# Patient Record
Sex: Female | Born: 1935 | Hispanic: No | Smoking: Never smoker
Health system: Southern US, Community
[De-identification: ages and names within clinical notes are randomized; demographics above are authoritative.]

## PROBLEM LIST (undated history)

## (undated) DIAGNOSIS — G309 Alzheimer's disease, unspecified: Secondary | ICD-10-CM

## (undated) DIAGNOSIS — E119 Type 2 diabetes mellitus without complications: Secondary | ICD-10-CM

## (undated) DIAGNOSIS — E079 Disorder of thyroid, unspecified: Secondary | ICD-10-CM

## (undated) DIAGNOSIS — K5730 Diverticulosis of large intestine without perforation or abscess without bleeding: Secondary | ICD-10-CM

## (undated) DIAGNOSIS — F32A Depression, unspecified: Secondary | ICD-10-CM

## (undated) DIAGNOSIS — K222 Esophageal obstruction: Secondary | ICD-10-CM

## (undated) DIAGNOSIS — E785 Hyperlipidemia, unspecified: Secondary | ICD-10-CM

## (undated) DIAGNOSIS — D126 Benign neoplasm of colon, unspecified: Secondary | ICD-10-CM

## (undated) DIAGNOSIS — F419 Anxiety disorder, unspecified: Secondary | ICD-10-CM

## (undated) DIAGNOSIS — K449 Diaphragmatic hernia without obstruction or gangrene: Secondary | ICD-10-CM

## (undated) DIAGNOSIS — E559 Vitamin D deficiency, unspecified: Secondary | ICD-10-CM

## (undated) DIAGNOSIS — F0280 Dementia in other diseases classified elsewhere without behavioral disturbance: Secondary | ICD-10-CM

## (undated) DIAGNOSIS — F329 Major depressive disorder, single episode, unspecified: Secondary | ICD-10-CM

## (undated) DIAGNOSIS — F0390 Unspecified dementia without behavioral disturbance: Secondary | ICD-10-CM

## (undated) DIAGNOSIS — R5381 Other malaise: Secondary | ICD-10-CM

## (undated) DIAGNOSIS — I10 Essential (primary) hypertension: Secondary | ICD-10-CM

## (undated) DIAGNOSIS — R42 Dizziness and giddiness: Secondary | ICD-10-CM

## (undated) DIAGNOSIS — R269 Unspecified abnormalities of gait and mobility: Principal | ICD-10-CM

## (undated) DIAGNOSIS — R1110 Vomiting, unspecified: Secondary | ICD-10-CM

## (undated) DIAGNOSIS — D638 Anemia in other chronic diseases classified elsewhere: Secondary | ICD-10-CM

## (undated) DIAGNOSIS — H9190 Unspecified hearing loss, unspecified ear: Secondary | ICD-10-CM

## (undated) DIAGNOSIS — M545 Low back pain: Secondary | ICD-10-CM

## (undated) DIAGNOSIS — R5383 Other fatigue: Secondary | ICD-10-CM

## (undated) DIAGNOSIS — I739 Peripheral vascular disease, unspecified: Secondary | ICD-10-CM

## (undated) DIAGNOSIS — M62838 Other muscle spasm: Secondary | ICD-10-CM

## (undated) DIAGNOSIS — K210 Gastro-esophageal reflux disease with esophagitis: Secondary | ICD-10-CM

## (undated) HISTORY — DX: Essential (primary) hypertension: I10

## (undated) HISTORY — DX: Other muscle spasm: M62.838

## (undated) HISTORY — DX: Vitamin D deficiency, unspecified: E55.9

## (undated) HISTORY — DX: Vomiting, unspecified: R11.10

## (undated) HISTORY — DX: Type 2 diabetes mellitus without complications: E11.9

## (undated) HISTORY — DX: Alzheimer's disease, unspecified: G30.9

## (undated) HISTORY — DX: Low back pain: M54.5

## (undated) HISTORY — DX: Hyperlipidemia, unspecified: E78.5

## (undated) HISTORY — DX: Disorder of thyroid, unspecified: E07.9

## (undated) HISTORY — DX: Gastro-esophageal reflux disease with esophagitis: K21.0

## (undated) HISTORY — DX: Unspecified dementia without behavioral disturbance: F03.90

## (undated) HISTORY — DX: Peripheral vascular disease, unspecified: I73.9

## (undated) HISTORY — DX: Dementia in other diseases classified elsewhere without behavioral disturbance: F02.80

## (undated) HISTORY — DX: Diverticulosis of large intestine without perforation or abscess without bleeding: K57.30

## (undated) HISTORY — PX: RETINAL DETACHMENT SURGERY: SHX105

## (undated) HISTORY — DX: Anxiety disorder, unspecified: F41.9

## (undated) HISTORY — DX: Dizziness and giddiness: R42

## (undated) HISTORY — DX: Diaphragmatic hernia without obstruction or gangrene: K44.9

## (undated) HISTORY — PX: BREAST CYST ASPIRATION: SHX578

## (undated) HISTORY — DX: Depression, unspecified: F32.A

## (undated) HISTORY — DX: Other fatigue: R53.83

## (undated) HISTORY — DX: Benign neoplasm of colon, unspecified: D12.6

## (undated) HISTORY — DX: Anemia in other chronic diseases classified elsewhere: D63.8

## (undated) HISTORY — DX: Other malaise: R53.81

## (undated) HISTORY — DX: Unspecified abnormalities of gait and mobility: R26.9

## (undated) HISTORY — DX: Esophageal obstruction: K22.2

## (undated) HISTORY — DX: Unspecified hearing loss, unspecified ear: H91.90

## (undated) HISTORY — DX: Major depressive disorder, single episode, unspecified: F32.9

---
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
## 1983-12-16 HISTORY — PX: CATARACT EXTRACTION W/ INTRAOCULAR LENS  IMPLANT, BILATERAL: SHX1307

## 2002-02-14 ENCOUNTER — Other Ambulatory Visit: Admission: RE | Admit: 2002-02-14 | Discharge: 2002-02-14 | Payer: Self-pay | Admitting: Geriatric Medicine

## 2009-05-23 ENCOUNTER — Encounter: Admission: RE | Admit: 2009-05-23 | Discharge: 2009-05-23 | Payer: Self-pay | Admitting: Geriatric Medicine

## 2009-06-04 ENCOUNTER — Ambulatory Visit (HOSPITAL_COMMUNITY): Admission: RE | Admit: 2009-06-04 | Discharge: 2009-06-04 | Payer: Self-pay | Admitting: Gastroenterology

## 2009-12-15 DIAGNOSIS — M545 Low back pain, unspecified: Secondary | ICD-10-CM

## 2009-12-15 DIAGNOSIS — R269 Unspecified abnormalities of gait and mobility: Secondary | ICD-10-CM

## 2009-12-15 DIAGNOSIS — R42 Dizziness and giddiness: Secondary | ICD-10-CM

## 2009-12-15 DIAGNOSIS — F0280 Dementia in other diseases classified elsewhere without behavioral disturbance: Secondary | ICD-10-CM

## 2009-12-15 DIAGNOSIS — E119 Type 2 diabetes mellitus without complications: Secondary | ICD-10-CM

## 2009-12-15 DIAGNOSIS — K222 Esophageal obstruction: Secondary | ICD-10-CM

## 2009-12-15 DIAGNOSIS — D126 Benign neoplasm of colon, unspecified: Secondary | ICD-10-CM

## 2009-12-15 DIAGNOSIS — K449 Diaphragmatic hernia without obstruction or gangrene: Secondary | ICD-10-CM

## 2009-12-15 DIAGNOSIS — R5381 Other malaise: Secondary | ICD-10-CM

## 2009-12-15 DIAGNOSIS — I739 Peripheral vascular disease, unspecified: Secondary | ICD-10-CM

## 2009-12-15 DIAGNOSIS — R1110 Vomiting, unspecified: Secondary | ICD-10-CM

## 2009-12-15 DIAGNOSIS — K5730 Diverticulosis of large intestine without perforation or abscess without bleeding: Secondary | ICD-10-CM

## 2009-12-15 DIAGNOSIS — E559 Vitamin D deficiency, unspecified: Secondary | ICD-10-CM

## 2009-12-15 DIAGNOSIS — H9190 Unspecified hearing loss, unspecified ear: Secondary | ICD-10-CM

## 2009-12-15 DIAGNOSIS — K2100 Gastro-esophageal reflux disease with esophagitis, without bleeding: Secondary | ICD-10-CM

## 2009-12-15 DIAGNOSIS — F0390 Unspecified dementia without behavioral disturbance: Secondary | ICD-10-CM

## 2009-12-15 HISTORY — PX: ESOPHAGEAL DILATION: SHX303

## 2009-12-15 HISTORY — DX: Vomiting, unspecified: R11.10

## 2009-12-15 HISTORY — DX: Other malaise: R53.81

## 2009-12-15 HISTORY — DX: Dizziness and giddiness: R42

## 2009-12-15 HISTORY — DX: Benign neoplasm of colon, unspecified: D12.6

## 2009-12-15 HISTORY — DX: Unspecified abnormalities of gait and mobility: R26.9

## 2009-12-15 HISTORY — DX: Diverticulosis of large intestine without perforation or abscess without bleeding: K57.30

## 2009-12-15 HISTORY — DX: Esophageal obstruction: K22.2

## 2009-12-15 HISTORY — DX: Unspecified dementia, unspecified severity, without behavioral disturbance, psychotic disturbance, mood disturbance, and anxiety: F03.90

## 2009-12-15 HISTORY — DX: Gastro-esophageal reflux disease with esophagitis, without bleeding: K21.00

## 2009-12-15 HISTORY — DX: Low back pain, unspecified: M54.50

## 2009-12-15 HISTORY — DX: Vitamin D deficiency, unspecified: E55.9

## 2009-12-15 HISTORY — DX: Peripheral vascular disease, unspecified: I73.9

## 2009-12-15 HISTORY — DX: Diaphragmatic hernia without obstruction or gangrene: K44.9

## 2009-12-15 HISTORY — DX: Dementia in other diseases classified elsewhere, unspecified severity, without behavioral disturbance, psychotic disturbance, mood disturbance, and anxiety: F02.80

## 2009-12-15 HISTORY — DX: Unspecified hearing loss, unspecified ear: H91.90

## 2009-12-15 HISTORY — DX: Type 2 diabetes mellitus without complications: E11.9

## 2010-03-20 ENCOUNTER — Ambulatory Visit (HOSPITAL_COMMUNITY): Admission: RE | Admit: 2010-03-20 | Discharge: 2010-03-20 | Payer: Self-pay | Admitting: Gastroenterology

## 2010-04-17 ENCOUNTER — Ambulatory Visit (HOSPITAL_COMMUNITY): Admission: RE | Admit: 2010-04-17 | Discharge: 2010-04-17 | Payer: Self-pay | Admitting: Gastroenterology

## 2010-06-05 ENCOUNTER — Encounter: Admission: RE | Admit: 2010-06-05 | Discharge: 2010-06-05 | Payer: Self-pay | Admitting: Geriatric Medicine

## 2010-12-15 DIAGNOSIS — D638 Anemia in other chronic diseases classified elsewhere: Secondary | ICD-10-CM

## 2010-12-15 HISTORY — DX: Anemia in other chronic diseases classified elsewhere: D63.8

## 2011-04-29 NOTE — Op Note (Signed)
NAMESEVYN, [name]                  ACCOUNT NO.:  [id number]   MEDICAL RECORD NO.:  [id number]          PATIENT TYPE:  AMB   LOCATION:  ENDO                         FACILITY:  [facility]   PHYSICIAN:  Danise Edge, M.D.   DATE OF BIRTH:  [birth year]   DATE OF PROCEDURE:  06/04/2009  DATE OF DISCHARGE:  06/04/2009                               OPERATIVE REPORT   An esophagogastroduodenoscopy and Savary esophageal dilation report   REFERRING PHYSICIAN:  Hal T. Stoneking, MD   PROBLEM:  High-grade distal esophageal obstruction.   HISTORY:  Ms. [name] is a 77 year old female born on 30-Sep-1913.  The  patient has both liquid and solid food dysphagia.  She denies heartburn  but does experience regurgitation of mucus and saliva.   The patient's barium swallow x-ray showed a dilated proximal esophagus  with diminished primary peristalsis and distal esophageal narrowing  suspicious for a stricture above a large hiatal hernia.  The 13-mm  barium tablet would not pass the distal esophageal narrowing.   MEDICATION ALLERGIES:  None.   PAST MEDICAL AND SURGICAL HISTORY:  Hypertension diagnosed in 1990,  adenomatous colon polyp removed colonoscopically.  Hypercholesterolemia,  hyperglycinemia.   SOCIAL HISTORY:  The patient does not smoke cigarettes or consume  alcohol.   CHRONIC MEDICATIONS:  1. Norvasc.  2. Micardis.  3. Maxzide.  4. Calcium with vitamin D.  5. Aspirin 81 mg .  6. Multivitamin.   ENDOSCOPIST:  Danise Edge, MD   PREMEDICATION:  1. Fentanyl 35 mcg.  2. Versed 5 mg.   PROCEDURE:  After obtaining informed consent, the patient was placed in  the left lateral decubitus position on the fluoroscopy table.  I  administered intravenous fentanyl and intravenous Versed to achieve  conscious sedation for the procedure.  The patient's blood pressure,  oxygen saturation, and cardiac rhythm were monitored throughout the  procedure and documented in the medical record.   The Pentax gastroscope was passed through the posterior hypopharynx into  the proximal esophagus without difficulty.  The hypopharynx, larynx, and  vocal cords appeared normal.   ESOPHAGOSCOPY:  The proximal and mid segments of the esophagus appeared  normal.  There is a benign-appearing peptic stricture at the  esophagogastric junction which is noted at approximately 34 cm from the  incisor teeth.  There is no endoscopic evidence for the presence of  esophageal tumors, erosive esophagitis, or Barrett esophagus.  I could  not traverse the benign distal esophageal stricture with the  gastroscope.  The Savary dilator wire was passed through the gastroscope  and under fluoroscopic guidance advanced to the distal gastric antrum.  Under fluoroscopic guidance, the 10-mm, 11-mm, and 12-mm Savary dilators  were passed without resistance.  The Savary dilator wire was removed.  The Pentax gastroscope was again passed through the posterior  hypopharynx into the proximal esophagus without difficulty.  I was  easily able to traverse the benign distal esophageal stricture and  entered the stomach with the gastroscope.   GASTROSCOPY:  Retroflex view of the gastric cardia and fundus reveals a  large hiatal hernia.  The gastric cardia and fundus otherwise appeared  normal.  The gastric body, antrum, and pylorus appeared normal.   DUODENOSCOPY:  The duodenal bulb and descending duodenum appeared  normal.   ASSESSMENT:  Benign-appearing distal esophageal stricture at 34 cm from  the incisor teeth associated with a large hiatal hernia.  Savary  esophageal dilation performed with the 10-mm, 11-mm, and 12-mm Savary  dilators.           ______________________________  Danise Edge, M.D.     MJ/MEDQ  D:  06/04/2009  T:  06/05/2009  Job:  [id number]   cc:   Hal T. Stoneking, M.D.

## 2011-11-15 DIAGNOSIS — M62838 Other muscle spasm: Secondary | ICD-10-CM

## 2011-11-15 HISTORY — DX: Other muscle spasm: M62.838

## 2013-04-05 LAB — CBC AND DIFFERENTIAL
Hemoglobin: 13.4 g/dL (ref 12.0–16.0)
WBC: 14.5 10^3/mL

## 2013-04-05 LAB — BASIC METABOLIC PANEL
BUN: 20 mg/dL (ref 4–21)
Creatinine: 1.2 mg/dL — AB (ref 0.5–1.1)
Potassium: 4.2 mmol/L (ref 3.4–5.3)
Sodium: 129 mmol/L — AB (ref 137–147)

## 2013-04-05 LAB — HEPATIC FUNCTION PANEL
AST: 20 U/L (ref 13–35)
Bilirubin, Total: 0.4 mg/dL

## 2013-04-06 ENCOUNTER — Encounter: Payer: Self-pay | Admitting: Family Medicine

## 2013-04-06 ENCOUNTER — Ambulatory Visit (INDEPENDENT_AMBULATORY_CARE_PROVIDER_SITE_OTHER): Payer: Medicare Other | Admitting: Family Medicine

## 2013-04-06 VITALS — BP 110/80 | HR 72 | Temp 98.4°F | Resp 16

## 2013-04-06 DIAGNOSIS — R42 Dizziness and giddiness: Secondary | ICD-10-CM

## 2013-04-06 DIAGNOSIS — F0391 Unspecified dementia with behavioral disturbance: Secondary | ICD-10-CM

## 2013-04-06 DIAGNOSIS — N390 Urinary tract infection, site not specified: Secondary | ICD-10-CM

## 2013-04-06 DIAGNOSIS — F03918 Unspecified dementia, unspecified severity, with other behavioral disturbance: Secondary | ICD-10-CM

## 2013-04-06 DIAGNOSIS — M549 Dorsalgia, unspecified: Secondary | ICD-10-CM

## 2013-04-06 LAB — POCT URINALYSIS DIPSTICK
Bilirubin, UA: NEGATIVE
Urobilinogen, UA: 0.2
pH, UA: 7

## 2013-04-06 LAB — POCT CBC
Granulocyte percent: 64.6 %G (ref 37–80)
HCT, POC: 40.1 % (ref 37.7–47.9)
Hemoglobin: 13 g/dL (ref 12.2–16.2)
MCH, POC: 29.5 pg (ref 27–31.2)
MCHC: 32.4 g/dL (ref 31.8–35.4)
POC LYMPH PERCENT: 27.6 %L (ref 10–50)
Platelet Count, POC: 451 10*3/uL — AB (ref 142–424)

## 2013-04-06 LAB — POCT UA - MICROSCOPIC ONLY: Mucus, UA: NEGATIVE

## 2013-04-06 LAB — BASIC METABOLIC PANEL
Calcium: 9.9 mg/dL (ref 8.4–10.5)
Creat: 1.03 mg/dL (ref 0.50–1.10)
Glucose, Bld: 110 mg/dL — ABNORMAL HIGH (ref 70–99)
Sodium: 123 mEq/L — ABNORMAL LOW (ref 135–145)

## 2013-04-06 MED ORDER — CEFTRIAXONE SODIUM 1 G IJ SOLR
1.0000 g | Freq: Once | INTRAMUSCULAR | Status: AC
  Administered 2013-04-06: 1 g via INTRAMUSCULAR

## 2013-04-06 NOTE — Progress Notes (Signed)
Subjective:    Patient ID: [name], female    DOB: May 01, 1913, 79 y.o.   MRN: [id number] Chief Complaint  Patient presents with  . Back Pain    has had U/A Labs and xrays already   . Dementia    worse past few days  . Dizziness    feels unsteady    HPI  [name] is a 79 yo woman who lives in a nursing home.  She spent this past Easter wkend with her granddaughter [name] who noted that she was acting very confused - [name] does have dementia but she normally makes sense and can answer easy questions, will remember who she is with or where she is - does recognize her granddaughter now but can't remember new info for just a few mintues which is unusal for her and she is less conversational than normal. She has fallen twice, they found her sitting on floor in her room - denied falling but she couldn't get up and clearly wouldn't voluntarily sit on the floor and yest she was found on the bathroom floor. [name] has been waking up every 2 hrs w/ terrible muscle cramps - in legs, fingers, toes. Does chronically have finger, toe cramps but the calf cramping is new.  She also has been complaining of some right sided back pain - over the area where her kidneys are and seems to be urinating more frequently but unsure if she could have hurt herself in a fall so xrays were taken at the home today but haven't been read yet. Because of the variety of symptoms, [name] was appropriately concerned that her grandmother could have a UTI.  The nursing home took a urine sample yest - Tues.  She was told that the prelimanry UA looked suspicious. Culture results will be back tomorrow. She has not been started on any medication yet but [name] is concerned that in the meantime the infection could go to kidneys or cause further damage. No fever but she has been feeling warm.  Her PCP is [name] at [facility].   2 wks ago had tooth abscess that was pulled. Was put on amoxicillin x  10d, finished last Wednesday.  History reviewed. No pertinent past medical history. No current outpatient prescriptions on file prior to visit.   No current facility-administered medications on file prior to visit.   Not on File   Review of Systems  Constitutional: Positive for fatigue. Negative for fever, chills and unexpected weight change.  HENT: Positive for dental problem.   Respiratory: Negative for cough and shortness of breath.   Gastrointestinal: Negative for nausea, vomiting, diarrhea, constipation and blood in stool.  Genitourinary: Positive for frequency and enuresis.  Musculoskeletal: Positive for myalgias, back pain, arthralgias and gait problem.  Skin: Negative for rash.  Neurological: Positive for dizziness, weakness and light-headedness.  Hematological: Negative for adenopathy. Bruises/bleeds easily.  Psychiatric/Behavioral: Positive for confusion and decreased concentration. Negative for hallucinations and sleep disturbance.      BP 110/80  Pulse 72  Temp(Src) 98.4 F (36.9 C)  Resp 16  SpO2 94% Objective:   Physical Exam  Constitutional: She appears well-developed and well-nourished. She appears lethargic. No distress.  In a wheelchair, very hard of hearing, only answers some direct questions. When asked about her symptoms, "I don't know."  Asks her granddaughter several times what she is doing here and what I am doing. Needs max assist even with leaning forward in  chair, needs assist x 2 to transfer to toilet.  HENT:  Head: Normocephalic and atraumatic.  Right Ear: External ear normal.  Left Ear: External ear normal.  Eyes: Conjunctivae are normal. No scleral icterus.  Neck: Normal range of motion. Neck supple. No thyromegaly present.  Cardiovascular: Normal rate, regular rhythm, normal heart sounds and intact distal pulses.   Pulmonary/Chest: Effort normal. No respiratory distress. She has rales in the right lower field and the left lower field.   Bibasilar insp rales cleared w/ deep breathing  Abdominal: There is CVA tenderness.  Severe Rt CVA tenderness.  Lymphadenopathy:    She has no cervical adenopathy.  Neurological: She appears lethargic. She displays atrophy. Gait abnormal.  Skin: Skin is warm and dry. She is not diaphoretic. No erythema.  Psychiatric: Her affect is blunt. Her speech is delayed. She is slowed and withdrawn. Cognition and memory are impaired. She exhibits abnormal recent memory.      Results for orders placed in visit on 04/06/13  BASIC METABOLIC PANEL      Result Value Range   Sodium 123 (*) 135 - 145 mEq/L   Potassium 4.0  3.5 - 5.3 mEq/L   Chloride 87 (*) 96 - 112 mEq/L   CO2 27  19 - 32 mEq/L   Glucose, Bld 110 (*) 70 - 99 mg/dL   BUN 19  6 - 23 mg/dL   Creat 9.60  4.54 - 0.98 mg/dL   Calcium 9.9  8.4 - 11.9 mg/dL  POCT CBC      Result Value Range   WBC 13.4 (*) 4.6 - 10.2 K/uL   Lymph, poc 3.7 (*) 0.6 - 3.4   POC LYMPH PERCENT 27.6  10 - 50 %L   MID (cbc) 1.0 (*) 0 - 0.9   POC MID % 7.8  0 - 12 %M   POC Granulocyte 8.7 (*) 2 - 6.9   Granulocyte percent 64.6  37 - 80 %G   RBC 4.40  4.04 - 5.48 M/uL   Hemoglobin 13.0  12.2 - 16.2 g/dL   HCT, POC 14.7  82.9 - 47.9 %   MCV 91.2  80 - 97 fL   MCH, POC 29.5  27 - 31.2 pg   MCHC 32.4  31.8 - 35.4 g/dL   RDW, POC 56.2     Platelet Count, POC 451 (*) 142 - 424 K/uL   MPV 8.1  0 - 99.8 fL  POCT UA - MICROSCOPIC ONLY      Result Value Range   WBC, Ur, HPF, POC 3-6     RBC, urine, microscopic 0-4     Bacteria, U Microscopic neg     Mucus, UA neg     Epithelial cells, urine per micros 0-5     Crystals, Ur, HPF, POC neg     Casts, Ur, LPF, POC renal tubular     Yeast, UA neg    POCT URINALYSIS DIPSTICK      Result Value Range   Color, UA yellow     Clarity, UA clear     Glucose, UA neg     Bilirubin, UA neg     Ketones, UA neg     Spec Grav, UA 1.015     Blood, UA neg     pH, UA 7.0     Protein, UA neg     Urobilinogen, UA 0.2      Nitrite, UA neg     Leukocytes, UA moderate (2+)  Assessment & Plan:  [name] is pt's granddaughter - her cell # is [phone] and we can call her tonight w/ results.  If [name] needs any meds or treatments, Selena Batten should be able to arrange it.  Will have someone stay with Ms. Rotan tonight to ensure not more falls.    Dizziness and giddiness and Dementia with behavioral disturbance - Plan: POCT CBC, POCT UA - Microscopic Only, POCT urinalysis dipstick, Basic metabolic panel,   Urinary tract infection, site not specified - Plan: cefTRIAXone (ROCEPHIN) injection 1 g IM x 1 given in office, UClx at her nursing home should be back tomorrow to see if pt needs additional antibiotics.  Back pain - Likely CVA tenderness. Cont to monitor while waiting for xray interpretation. If she continues to have pain, consider repeat eval.  Hyponatremia - worsening, family has been trying to push water due to dizziness and poss UTI.  Have asked nursing home to hold pt's maxide x 1d until I can discuss further with her PCP.  Try to drink more pedialyte and gatorade.  Do not add to much salt to food as that may increase BP which might be elev already with holding BP med.  This may cause of her worsening dizziness and cognition as well as muscle cramps.  Rec repeat bmp in 1-2d and consider d/c'ing maxide for an extended period if monitoring pressures.  Meds ordered this encounter  Medications  . levothyroxine (SYNTHROID, LEVOTHROID) 25 MCG tablet    Sig: Take 25 mcg by mouth daily before breakfast.  . mirtazapine (REMERON) 15 MG tablet    Sig: Take 15 mg by mouth at bedtime.  Marland Kitchen aspirin 81 MG tablet    Sig: Take 81 mg by mouth daily.  Marland Kitchen losartan (COZAAR) 100 MG tablet    Sig: Take 100 mg by mouth daily.  . metoprolol (LOPRESSOR) 50 MG tablet    Sig: Take 50 mg by mouth 2 (two) times daily.  Marland Kitchen triamterene-hydrochlorothiazide (MAXZIDE-25) 37.5-25 MG per tablet    Sig: Take 1 tablet by mouth daily.  .  memantine (NAMENDA) 5 MG tablet    Sig: Take 5 mg by mouth 2 (two) times daily.  Marland Kitchen omeprazole (PRILOSEC) 20 MG capsule    Sig: Take 20 mg by mouth daily.  Marland Kitchen donepezil (ARICEPT) 5 MG tablet    Sig: Take 5 mg by mouth at bedtime as needed.  . saccharomyces boulardii (FLORASTOR) 250 MG capsule    Sig: Take 250 mg by mouth 2 (two) times daily.  . cefTRIAXone (ROCEPHIN) injection 1 g    Sig:

## 2013-04-06 NOTE — Patient Instructions (Addendum)
Hold the triamterene-hctz for 1 day.  This medicine has the potential to be contributing to dehydration, kidney irritation, abnormal salts in the blood, and muscle cramping.  Urinary Tract Infection Urinary tract infections (UTIs) can develop anywhere along your urinary tract. Your urinary tract is your body's drainage system for removing wastes and extra water. Your urinary tract includes two kidneys, two ureters, a bladder, and a urethra. Your kidneys are a pair of bean-shaped organs. Each kidney is about the size of your fist. They are located below your ribs, one on each side of your spine. CAUSES Infections are caused by microbes, which are microscopic organisms, including fungi, viruses, and bacteria. These organisms are so small that they can only be seen through a microscope. Bacteria are the microbes that most commonly cause UTIs. SYMPTOMS  Symptoms of UTIs may vary by age and gender of the patient and by the location of the infection. Symptoms in young women typically include a frequent and intense urge to urinate and a painful, burning feeling in the bladder or urethra during urination. Older women and men are more likely to be tired, shaky, and weak and have muscle aches and abdominal pain. A fever may mean the infection is in your kidneys. Other symptoms of a kidney infection include pain in your back or sides below the ribs, nausea, and vomiting. DIAGNOSIS To diagnose a UTI, your caregiver will ask you about your symptoms. Your caregiver also will ask to provide a urine sample. The urine sample will be tested for bacteria and white blood cells. White blood cells are made by your body to help fight infection. TREATMENT  Typically, UTIs can be treated with medication. Because most UTIs are caused by a bacterial infection, they usually can be treated with the use of antibiotics. The choice of antibiotic and length of treatment depend on your symptoms and the type of bacteria causing your  infection. HOME CARE INSTRUCTIONS  If you were prescribed antibiotics, take them exactly as your caregiver instructs you. Finish the medication even if you feel better after you have only taken some of the medication.  Drink enough water and fluids to keep your urine clear or pale yellow.  Avoid caffeine, tea, and carbonated beverages. They tend to irritate your bladder.  Empty your bladder often. Avoid holding urine for long periods of time.  Empty your bladder before and after sexual intercourse.  After a bowel movement, women should cleanse from front to back. Use each tissue only once. SEEK MEDICAL CARE IF:   You have back pain.  You develop a fever.  Your symptoms do not begin to resolve within 3 days. SEEK IMMEDIATE MEDICAL CARE IF:   You have severe back pain or lower abdominal pain.  You develop chills.  You have nausea or vomiting.  You have continued burning or discomfort with urination. MAKE SURE YOU:   Understand these instructions.  Will watch your condition.  Will get help right away if you are not doing well or get worse. Document Released: 09/10/2005 Document Revised: 06/01/2012 Document Reviewed: 01/09/2012 [facility] Patient Information 2013 [employer], [place].

## 2013-04-07 ENCOUNTER — Encounter: Payer: Self-pay | Admitting: Nurse Practitioner

## 2013-04-07 ENCOUNTER — Non-Acute Institutional Stay: Payer: Medicare Other | Admitting: Nurse Practitioner

## 2013-04-07 VITALS — BP 110/62 | HR 68

## 2013-04-07 DIAGNOSIS — D72829 Elevated white blood cell count, unspecified: Secondary | ICD-10-CM

## 2013-04-07 DIAGNOSIS — F329 Major depressive disorder, single episode, unspecified: Secondary | ICD-10-CM

## 2013-04-07 DIAGNOSIS — M25552 Pain in left hip: Secondary | ICD-10-CM

## 2013-04-07 DIAGNOSIS — E039 Hypothyroidism, unspecified: Secondary | ICD-10-CM

## 2013-04-07 DIAGNOSIS — M25559 Pain in unspecified hip: Secondary | ICD-10-CM

## 2013-04-07 DIAGNOSIS — E871 Hypo-osmolality and hyponatremia: Secondary | ICD-10-CM

## 2013-04-07 DIAGNOSIS — R269 Unspecified abnormalities of gait and mobility: Secondary | ICD-10-CM

## 2013-04-07 DIAGNOSIS — G309 Alzheimer's disease, unspecified: Secondary | ICD-10-CM

## 2013-04-07 DIAGNOSIS — F32A Depression, unspecified: Secondary | ICD-10-CM

## 2013-04-07 DIAGNOSIS — M546 Pain in thoracic spine: Secondary | ICD-10-CM | POA: Insufficient documentation

## 2013-04-07 DIAGNOSIS — F0280 Dementia in other diseases classified elsewhere without behavioral disturbance: Secondary | ICD-10-CM

## 2013-04-07 DIAGNOSIS — I10 Essential (primary) hypertension: Secondary | ICD-10-CM

## 2013-04-07 DIAGNOSIS — M62838 Other muscle spasm: Secondary | ICD-10-CM

## 2013-04-07 NOTE — Assessment & Plan Note (Signed)
PT to eval and treat as indicated.

## 2013-04-07 NOTE — Assessment & Plan Note (Signed)
Per the patient's dtr, unable to reproduce today upon my examination, may consider MRI of the pelvis if recurs given hx of fall and unreliable HPI due to the patient's advanced dementia.

## 2013-04-07 NOTE — Assessment & Plan Note (Addendum)
Has increased confusion, takes Namenda and Aricept. May consider CT head if no better. Hyponatremia and leukocytosis may contribute to the problem.

## 2013-04-07 NOTE — Assessment & Plan Note (Signed)
Unable to sleep at night for last a few nights per the patient's sitter since her last fall, sleeps during day, Mirtazapine 7.5mg  has questionable efficacy due to the patient advanced dementia

## 2013-04-07 NOTE — Assessment & Plan Note (Signed)
Controlled on Cozaar and Metoprolol    

## 2013-04-07 NOTE — Assessment & Plan Note (Signed)
Takes Synthroid 25mcg daily. TSH 2.900 04/05/13                       

## 2013-04-07 NOTE — Progress Notes (Signed)
Patient ID: [name], female   DOB: 31-Oct-1913, 79 y.o.   MRN: [id number] Code Status:   No Known Allergies  Chief Complaint  Patient presents with  . Acute Visit    low back pain, increase confusion. Went to Urgent Care yesterday, was given Ceflriaxone  injection.    HPI: Patient is a 79 y.o. female seen in the clinic at [facility] today for s/p fall, right mid back pain, increased confusion Problem List Items Addressed This Visit     ICD-9-CM   Abnormality of gait - Primary     PT to eval and treat as indicated.     Hypertension     Controlled on Cozaar and Metoprolol     Depression     Unable to sleep at night for last a few nights per the patient's sitter since her last fall, sleeps during day, Mirtazapine 7.5mg  has questionable efficacy due to the patient advanced dementia    Alzheimer's disease     Has increased confusion, takes Namenda and Aricept. May consider CT head if no better. Hyponatremia and leukocytosis may contribute to the problem.     Spasm of muscle     Will try Clonazepam 0.5mg  q8hr prr    Back pain, thoracic     Posterior right lower rib cage region: will obtain CXR, Rib series of the right, Cath UA and C/S. Had Urgent Care 04/06/13 pm. Had X-ray R hip/L spine: unremarkable.     Leukocytosis, unspecified     Rocephin 1gm IM for now.     Hyponatremia     Maxzide on hold, f/u BMP in am, Gatorade 240mg  tid.     Unspecified hypothyroidism     Takes Synthroid daily. TSH 2.900 04/05/13    Pain in joint, pelvic region and thigh     Per the patient's dtr, unable to reproduce today upon my examination, may consider MRI of the pelvis if recurs given hx of fall and unreliable HPI due to the patient's advanced dementia.        Review of Systems:  Review of Systems  Constitutional: Positive for malaise/fatigue. Negative for fever, chills, weight loss and diaphoresis.  HENT: Positive for hearing loss. Negative for ear pain, congestion, sore  throat and neck pain.   Eyes: Negative for pain, discharge and redness.  Respiratory: Negative for cough, sputum production, shortness of breath and wheezing.   Cardiovascular: Negative for chest pain, palpitations, orthopnea, claudication, leg swelling and PND.  Gastrointestinal: Negative for heartburn, nausea, vomiting, abdominal pain, diarrhea, constipation, blood in stool and melena.  Genitourinary: Positive for frequency. Negative for dysuria, urgency, hematuria and flank pain.  Musculoskeletal: Positive for back pain and falls. Negative for myalgias and joint pain.  Skin: Negative for itching and rash.  Neurological: Positive for weakness (generalized. ). Negative for dizziness, tingling, tremors, sensory change, speech change, focal weakness, seizures, loss of consciousness and headaches.  Endo/Heme/Allergies: Negative for environmental allergies and polydipsia. Does not bruise/bleed easily.  Psychiatric/Behavioral: Positive for depression and memory loss. Negative for hallucinations. The patient has insomnia. The patient is not nervous/anxious.      Past Medical History  Diagnosis Date  . Spasm of muscle 11/2011  . Thyroid disease   . Anemia of other chronic disease 2012  . Anxiety   . Peripheral vascular disease, unspecified 2011  . Reflux esophagitis 2011  . Vomiting alone 2011  . Benign neoplasm of colon 2011  . Type II or unspecified type diabetes mellitus  without mention of complication, not stated as uncontrolled 2011  . Unspecified vitamin D deficiency 2011  . Hyperlipidemia   . Depression   . Alzheimer's disease 2011  . Unspecified hearing loss 2011  . Stricture and stenosis of esophagus 2011  . Diaphragmatic hernia without mention of obstruction or gangrene 2011  . Diverticulosis of colon (without mention of hemorrhage) 2011  . Lumbago 2011  . Dizziness and giddiness 2011  . Other malaise and fatigue 2011  . Abnormality of gait 2011  . Senile dementia,  uncomplicated 2011  . Hypertension    Past Surgical History  Procedure Laterality Date  . Tonsillectomy  1923  . Cataract extraction w/ intraocular lens  implant, bilateral  1985  . Retinal detachment surgery    . Esophageal dilation  2011  . Breast cyst aspiration     Social History:   reports that she has never smoked. She has never used smokeless tobacco. She reports that she does not drink alcohol or use illicit drugs.  No family history on file.  Medications: Patient's Medications  New Prescriptions   No medications on file  Previous Medications   ASPIRIN 81 MG TABLET    Take 81 mg by mouth daily.   CALCIUM CARBONATE-VITAMIN D (CALCIUM 500 + D) 500-125 MG-UNIT TABS    Take 1 tablet by mouth. Take one daily   DONEPEZIL (ARICEPT) 5 MG TABLET    Take 5 mg by mouth at bedtime as needed.   LEVOTHYROXINE (SYNTHROID, LEVOTHROID) 25 MCG TABLET    Take 25 mcg by mouth daily before breakfast.   LOSARTAN (COZAAR) 100 MG TABLET    Take 100 mg by mouth daily.   MEMANTINE (NAMENDA) 5 MG TABLET    Take 5 mg by mouth 2 (two) times daily.   METOPROLOL (LOPRESSOR) 50 MG TABLET    Take 50 mg by mouth. Take one daily for blood pressure   MIRTAZAPINE (REMERON) 15 MG TABLET    Take 15 mg by mouth. Take 1/2 tablet daily   MULTIPLE VITAMINS-MINERALS (CERTAVITE SENIOR/ANTIOXIDANT PO)    Take 1 tablet by mouth. Take one daily   OMEPRAZOLE (PRILOSEC) 20 MG CAPSULE    Take 20 mg by mouth. Take one twice daily   TRIAMTERENE-HYDROCHLOROTHIAZIDE (MAXZIDE-25) 37.5-25 MG PER TABLET    Take 1 tablet by mouth daily.  Modified Medications   No medications on file  Discontinued Medications   SACCHAROMYCES BOULARDII (FLORASTOR) 250 MG CAPSULE    Take 250 mg by mouth 2 (two) times daily.     Physical Exam: Physical Exam  Constitutional: She appears well-developed and well-nourished. No distress.  HENT:  Head: Normocephalic and atraumatic.  Nose: Nose normal.  Mouth/Throat: No oropharyngeal exudate.   Eyes: Conjunctivae and EOM are normal. Pupils are equal, round, and reactive to light. Right eye exhibits no discharge. Left eye exhibits no discharge. No scleral icterus.  Neck: Normal range of motion. Neck supple. No JVD present. No thyromegaly present.  Cardiovascular: Normal rate, regular rhythm and normal heart sounds.   No murmur heard. Pulmonary/Chest: Effort normal and breath sounds normal. No respiratory distress. She has no wheezes. She has no rales.  Abdominal: Soft. Bowel sounds are normal. She exhibits no distension. There is no tenderness.  Musculoskeletal: She exhibits tenderness (posterior lower rib cage pain when palpated or with deep breath). She exhibits no edema.  Lymphadenopathy:    She has no cervical adenopathy.  Neurological: She is alert. She has normal reflexes. She displays normal reflexes.  No cranial nerve deficit. She exhibits normal muscle tone. Coordination normal.  Skin: Skin is warm and dry. No rash noted. She is not diaphoretic. No erythema. No pallor.  Psychiatric: Her mood appears not anxious. Her affect is not angry, not blunt, not labile and not inappropriate. Her speech is delayed. Her speech is not rapid and/or pressured, not tangential and not slurred. She is slowed and withdrawn. She is not agitated, not aggressive, not hyperactive and not combative. Thought content is not paranoid and not delusional. Cognition and memory are impaired. She expresses impulsivity and inappropriate judgment. She exhibits a depressed mood. She is communicative. She exhibits abnormal recent memory and abnormal remote memory.    Filed Vitals:   04/07/13 1450  BP: 110/62  Pulse: 68      Labs reviewed: Basic Metabolic Panel:  Recent Labs  16/10/96 04/06/13 1729  NA 129* 123*  K 4.2 4.0  CL  --  87*  CO2  --  27  GLUCOSE  --  110*  BUN 20 19  CREATININE 1.2* 1.03  CALCIUM  --  9.9  TSH 2.90  --    Liver Function Tests:  Recent Labs  04/05/13  AST 20  ALT  10  ALKPHOS 95   No results found for this basename: LIPASE, AMYLASE,  in the last 8760 hours No results found for this basename: AMMONIA,  in the last 8760 hours CBC:  Recent Labs  04/05/13 04/06/13 1731  WBC 14.5 13.4*  HGB 13.4 13.0  HCT 39 40.1  MCV  --  91.2   Lipid Panel: No results found for this basename: CHOL, HDL, LDLCALC, TRIG, CHOLHDL, LDLDIRECT,  in the last 8760 hours Anemia Panel: No results found for this basename: FOLATE, IRON, VITAMINB12,  in the last 8760 hours  Past Procedures:  04/06/13 X-ray R hip and lumbar spine: no acute fracture, subluxation, dislocation, lytic destructive lesion, or vertebral body compression deformity. Minimal degenerative irregularity at the right greater trochanter.    Assessment/Plan Back pain, thoracic Posterior right lower rib cage region: will obtain CXR, Rib series of the right, Cath UA and C/S. Had Urgent Care 04/06/13 pm. Had X-ray R hip/L spine: unremarkable.   Spasm of muscle Will try Clonazepam 0.5mg  q8hr prr  Alzheimer's disease Has increased confusion, takes Namenda and Aricept. May consider CT head if no better. Hyponatremia and leukocytosis may contribute to the problem.   Depression Unable to sleep at night for last a few nights per the patient's sitter since her last fall, sleeps during day, Mirtazapine 7.5mg  has questionable efficacy due to the patient advanced dementia  Leukocytosis, unspecified Rocephin 1gm IM for now.   Hyponatremia Maxzide on hold, f/u BMP in am, Gatorade 240mg  tid.   Hypertension Controlled on Cozaar and Metoprolol   Abnormality of gait PT to eval and treat as indicated.   Unspecified hypothyroidism Takes Synthroid daily. TSH 2.900 04/05/13  Pain in joint, pelvic region and thigh Per the patient's dtr, unable to reproduce today upon my examination, may consider MRI of the pelvis if recurs given hx of fall and unreliable HPI due to the patient's advanced dementia.      Family/ Staff Communication: Sports coach for now, safety  Goals of Care: may be SNF if no improvement.   Labs/tests ordered: BMP

## 2013-04-07 NOTE — Assessment & Plan Note (Addendum)
Maxzide on hold, f/u BMP in am, Gatorade 240mg  tid.

## 2013-04-07 NOTE — Assessment & Plan Note (Addendum)
Posterior right lower rib cage region: will obtain CXR, Rib series of the right, Cath UA and C/S. Had Urgent Care 04/06/13 pm. Had X-ray R hip/L spine: unremarkable.

## 2013-04-07 NOTE — Assessment & Plan Note (Signed)
Will try Clonazepam 0.5mg  q8hr prr

## 2013-04-07 NOTE — Assessment & Plan Note (Signed)
Rocephin 1gm IM for now.

## 2013-04-09 LAB — BASIC METABOLIC PANEL: Potassium: 4.3 mmol/L (ref 3.4–5.3)

## 2013-04-14 ENCOUNTER — Non-Acute Institutional Stay: Payer: Medicare Other | Admitting: Nurse Practitioner

## 2013-04-14 DIAGNOSIS — G4700 Insomnia, unspecified: Secondary | ICD-10-CM

## 2013-04-14 DIAGNOSIS — M62838 Other muscle spasm: Secondary | ICD-10-CM

## 2013-04-14 DIAGNOSIS — F0280 Dementia in other diseases classified elsewhere without behavioral disturbance: Secondary | ICD-10-CM

## 2013-04-14 DIAGNOSIS — F329 Major depressive disorder, single episode, unspecified: Secondary | ICD-10-CM

## 2013-04-14 DIAGNOSIS — R269 Unspecified abnormalities of gait and mobility: Secondary | ICD-10-CM

## 2013-04-14 DIAGNOSIS — D72829 Elevated white blood cell count, unspecified: Secondary | ICD-10-CM

## 2013-04-14 DIAGNOSIS — E039 Hypothyroidism, unspecified: Secondary | ICD-10-CM

## 2013-04-14 DIAGNOSIS — M25559 Pain in unspecified hip: Secondary | ICD-10-CM

## 2013-04-14 DIAGNOSIS — F3289 Other specified depressive episodes: Secondary | ICD-10-CM

## 2013-04-14 DIAGNOSIS — F32A Depression, unspecified: Secondary | ICD-10-CM

## 2013-04-14 DIAGNOSIS — E871 Hypo-osmolality and hyponatremia: Secondary | ICD-10-CM

## 2013-04-14 DIAGNOSIS — M25552 Pain in left hip: Secondary | ICD-10-CM

## 2013-04-14 DIAGNOSIS — I10 Essential (primary) hypertension: Secondary | ICD-10-CM

## 2013-04-14 DIAGNOSIS — M546 Pain in thoracic spine: Secondary | ICD-10-CM

## 2013-04-14 DIAGNOSIS — G309 Alzheimer's disease, unspecified: Secondary | ICD-10-CM

## 2013-04-14 NOTE — Assessment & Plan Note (Signed)
Per the patient's dtr, unable to reproduce today upon my examination, may consider MRI of the pelvis if recurs given hx of fall and unreliable HPI due to the patient's advanced dementia. Resolved.

## 2013-04-14 NOTE — Assessment & Plan Note (Signed)
Continue Mirtazapine 7.5mg  nightly, adding Clonazepam 1mg  nightly.

## 2013-04-14 NOTE — Assessment & Plan Note (Signed)
Rocephin 1gm IM for now. It was related to acute bronchitis.

## 2013-04-14 NOTE — Assessment & Plan Note (Addendum)
Posterior right lower rib cage region-resolved, CXR, Rib series of the right 04/07/13 showed no acute fracture or lytic destructive lesion, mild to moderate bronchitis at the mid and lower lungs bilaterally, Cath UA and C/S 04/09/13 showed no growth. Had Urgent Care 04/06/13 pm. Had X-ray R hip/L spine: unremarkable. Takes Aleve 220mg  daily

## 2013-04-14 NOTE — Assessment & Plan Note (Addendum)
Will try Clonazepam 0.5mg  prn and 1mg  qhs for insomnia and muscle spasm.

## 2013-04-14 NOTE — Assessment & Plan Note (Signed)
Controlled on Cozaar and Metoprolol

## 2013-04-14 NOTE — Assessment & Plan Note (Signed)
Has increased confusion, takes Namenda and Aricept. May consider CT head if no better. Hyponatremia and leukocytosis may contribute to the problem. Obtain MMSE

## 2013-04-14 NOTE — Assessment & Plan Note (Signed)
Unable to sleep at night for last a few nights per the patient's sitter since her last fall, sleeps during day, Mirtazapine 7.5mg  has questionable efficacy due to the patient advanced dementia. Will address Insomnia/leg muscle spasm with Clonazepam.

## 2013-04-14 NOTE — Assessment & Plan Note (Signed)
W/c to and from dinning room, continue to work with PT

## 2013-04-14 NOTE — Progress Notes (Signed)
Patient ID: [name], female   DOB: 1913/08/25, 79 y.o.   MRN: [id number]  Chief Complaint:  Chief Complaint  Patient presents with  . Medical Managment of Chronic Issues    hyponatremia, memory, insomnia     HPI:   The patient is doing better today, no c/o pain in her right chest wall or left thigh.  Problem List Items Addressed This Visit     ICD-9-CM   Abnormality of gait - Primary     W/c to and from dinning room, continue to work with PT    Hypertension     Controlled on Cozaar and Metoprolol       Depression     Unable to sleep at night for last a few nights per the patient's sitter since her last fall, sleeps during day, Mirtazapine 7.5mg  has questionable efficacy due to the patient advanced dementia. Will address Insomnia/leg muscle spasm with Clonazepam.       Alzheimer's disease     Has increased confusion, takes Namenda and Aricept. May consider CT head if no better. Hyponatremia and leukocytosis may contribute to the problem. Obtain MMSE      Spasm of muscle     Will try Clonazepam 0.5mg  prn and 1mg  qhs for insomnia and muscle spasm.       Back pain, thoracic     Posterior right lower rib cage region-resolved, CXR, Rib series of the right 04/07/13 showed no acute fracture or lytic destructive lesion, mild to moderate bronchitis at the mid and lower lungs bilaterally, Cath UA and C/S 04/09/13 showed no growth. Had Urgent Care 04/06/13 pm. Had X-ray R hip/L spine: unremarkable. Takes Aleve 220mg  daily      Leukocytosis, unspecified     Rocephin 1gm IM for now. It was related to acute bronchitis.       Hyponatremia     Dc Maxzide--no s/o edema or CHF decompensation, f/u BMP still showed lower Na 124 04/09/13, increase Gatorade 240mg  up to 6x/day, repeat BMP in am.       Unspecified hypothyroidism     Takes Synthroid daily. TSH 2.900 04/05/13      Pain in joint, pelvic region and thigh     Per the patient's dtr, unable to reproduce today upon my  examination, may consider MRI of the pelvis if recurs given hx of fall and unreliable HPI due to the patient's advanced dementia. Resolved.       Insomnia     Continue Mirtazapine 7.5mg  nightly, adding Clonazepam 1mg  nightly.        Review of Systems:  Review of Systems  Constitutional: Positive for malaise/fatigue. Negative for fever, chills, weight loss and diaphoresis.  HENT: Positive for hearing loss. Negative for ear pain, congestion, sore throat and neck pain.   Eyes: Negative for pain, discharge and redness.  Respiratory: Negative for cough, sputum production, shortness of breath and wheezing.   Cardiovascular: Negative for chest pain, palpitations, orthopnea, claudication, leg swelling and PND.  Gastrointestinal: Negative for heartburn, nausea, vomiting, abdominal pain, diarrhea, constipation, blood in stool and melena.  Genitourinary: Positive for frequency. Negative for dysuria, urgency, hematuria and flank pain.  Musculoskeletal: Positive for falls. Negative for myalgias, back pain and joint pain.  Skin: Negative for itching and rash.  Neurological: Positive for weakness (generalized. ). Negative for dizziness, tingling, tremors, sensory change, speech change, focal weakness, seizures, loss of consciousness and headaches.  Endo/Heme/Allergies: Negative for environmental allergies and polydipsia. Does not bruise/bleed easily.  Psychiatric/Behavioral:  Positive for depression and memory loss. Negative for hallucinations. The patient has insomnia. The patient is not nervous/anxious.      Medications: Patient's Medications  New Prescriptions   No medications on file  Previous Medications   ASPIRIN 81 MG TABLET    Take 81 mg by mouth daily.   CALCIUM CARBONATE-VITAMIN D (CALCIUM 500 + D) 500-125 MG-UNIT TABS    Take 1 tablet by mouth. Take one daily   DONEPEZIL (ARICEPT) 5 MG TABLET    Take 5 mg by mouth at bedtime as needed.   LEVOTHYROXINE (SYNTHROID, LEVOTHROID) 25 MCG  TABLET    Take 25 mcg by mouth daily before breakfast.   LOSARTAN (COZAAR) 100 MG TABLET    Take 100 mg by mouth daily.   MEMANTINE (NAMENDA) 5 MG TABLET    Take 5 mg by mouth 2 (two) times daily.   METOPROLOL (LOPRESSOR) 50 MG TABLET    Take 50 mg by mouth. Take one daily for blood pressure   MIRTAZAPINE (REMERON) 15 MG TABLET    Take 15 mg by mouth. Take 1/2 tablet daily   MULTIPLE VITAMINS-MINERALS (CERTAVITE SENIOR/ANTIOXIDANT PO)    Take 1 tablet by mouth. Take one daily   OMEPRAZOLE (PRILOSEC) 20 MG CAPSULE    Take 20 mg by mouth. Take one twice daily   TRIAMTERENE-HYDROCHLOROTHIAZIDE (MAXZIDE-25) 37.5-25 MG PER TABLET    Take 1 tablet by mouth daily.  Modified Medications   No medications on file  Discontinued Medications   No medications on file     Physical Exam: Physical Exam  Constitutional: She appears well-developed and well-nourished. No distress.  HENT:  Head: Normocephalic and atraumatic.  Nose: Nose normal.  Mouth/Throat: No oropharyngeal exudate.  Eyes: Conjunctivae and EOM are normal. Pupils are equal, round, and reactive to light. Right eye exhibits no discharge. Left eye exhibits no discharge. No scleral icterus.  Neck: Normal range of motion. Neck supple. No JVD present. No thyromegaly present.  Cardiovascular: Normal rate, regular rhythm and normal heart sounds.   No murmur heard. Pulmonary/Chest: Effort normal and breath sounds normal. No respiratory distress. She has no wheezes. She has no rales.  Abdominal: Soft. Bowel sounds are normal. She exhibits no distension. There is no tenderness.  Musculoskeletal: She exhibits no edema and no tenderness (posterior lower rib cage pain when palpated or with deep breath).  Lymphadenopathy:    She has no cervical adenopathy.  Neurological: She is alert. She has normal reflexes. She displays normal reflexes. No cranial nerve deficit. She exhibits normal muscle tone. Coordination normal.  Skin: Skin is warm and dry. No  rash noted. She is not diaphoretic. No erythema. No pallor.  Psychiatric: Her mood appears not anxious. Her affect is not angry, not blunt, not labile and not inappropriate. Her speech is delayed. Her speech is not rapid and/or pressured, not tangential and not slurred. She is slowed and withdrawn. She is not agitated, not aggressive, not hyperactive and not combative. Thought content is not paranoid and not delusional. Cognition and memory are impaired. She expresses impulsivity and inappropriate judgment. She exhibits a depressed mood. She is communicative. She exhibits abnormal recent memory and abnormal remote memory.     Filed Vitals:   04/14/13 1216  BP: 118/68  Pulse: 62  Temp: 98.8 F (37.1 C)  TempSrc: Tympanic  Resp: 23      Labs reviewed: Basic Metabolic Panel:  Recent Labs  97/67/34 04/06/13 1729 04/09/13  NA 129* 123* 124*  K 4.2 4.0  4.3  CL  --  87*  --   CO2  --  27  --   GLUCOSE  --  110*  --   BUN 20 19 20   CREATININE 1.2* 1.03 1.1  CALCIUM  --  9.9  --   TSH 2.90  --   --     Liver Function Tests:  Recent Labs  04/05/13  AST 20  ALT 10  ALKPHOS 95    CBC:  Recent Labs  04/05/13 04/06/13 1731  WBC 14.5 13.4*  HGB 13.4 13.0  HCT 39 40.1  MCV  --  91.2    Anemia Panel: No results found for this basename: IRON, FOLATE, VITAMINB12,  in the last 8760 hours  Significant Diagnostic Results: 04/06/13 X-ray R hip and lumbar spine: no acute fracture, subluxation, dislocation, lytic destructive lesion, or vertebral body compression deformity. Minimal degenerative irregularity at the right greater trochanter.   04/07/13 CXR mild to moderate bronchitis at the mid and lower lungs bilaterally  X-ray R rib: no acute fracture or lytic destructive lesion.    Assessment/Plan Abnormality of gait W/c to and from dinning room, continue to work with PT  Hypertension Controlled on Cozaar and Metoprolol     Depression Unable to sleep at night for last a  few nights per the patient's sitter since her last fall, sleeps during day, Mirtazapine 7.5mg  has questionable efficacy due to the patient advanced dementia. Will address Insomnia/leg muscle spasm with Clonazepam.     Alzheimer's disease Has increased confusion, takes Namenda and Aricept. May consider CT head if no better. Hyponatremia and leukocytosis may contribute to the problem. Obtain MMSE    Spasm of muscle Will try Clonazepam 0.5mg  prn and 1mg  qhs for insomnia and muscle spasm.     Back pain, thoracic Posterior right lower rib cage region-resolved, CXR, Rib series of the right 04/07/13 showed no acute fracture or lytic destructive lesion, mild to moderate bronchitis at the mid and lower lungs bilaterally, Cath UA and C/S 04/09/13 showed no growth. Had Urgent Care 04/06/13 pm. Had X-ray R hip/L spine: unremarkable. Takes Aleve 220mg  daily    Leukocytosis, unspecified Rocephin 1gm IM for now. It was related to acute bronchitis.     Hyponatremia Dc Maxzide--no s/o edema or CHF decompensation, f/u BMP still showed lower Na 124 04/09/13, increase Gatorade 240mg  up to 6x/day, repeat BMP in am.     Unspecified hypothyroidism Takes Synthroid daily. TSH 2.900 04/05/13    Pain in joint, pelvic region and thigh Per the patient's dtr, unable to reproduce today upon my examination, may consider MRI of the pelvis if recurs given hx of fall and unreliable HPI due to the patient's advanced dementia. Resolved.     Insomnia Continue Mirtazapine 7.5mg  nightly, adding Clonazepam 1mg  nightly.       Family/ staff Communication: safety   Goals of care: AL   Labs/tests ordered BMP

## 2013-04-14 NOTE — Assessment & Plan Note (Signed)
Takes Synthroid 25mcg daily. TSH 2.900 04/05/13                       

## 2013-04-14 NOTE — Assessment & Plan Note (Signed)
Dc Maxzide--no s/o edema or CHF decompensation, f/u BMP still showed lower Na 124 04/09/13, increase Gatorade 240mg  up to 6x/day, repeat BMP in am.

## 2013-04-18 LAB — BASIC METABOLIC PANEL
Creatinine: 1 mg/dL (ref 0.5–1.1)
Potassium: 4 mmol/L (ref 3.4–5.3)
Sodium: 123 mmol/L — AB (ref 137–147)

## 2013-04-19 LAB — BASIC METABOLIC PANEL
Potassium: 4.2 mmol/L (ref 3.4–5.3)
Sodium: 135 mmol/L — AB (ref 137–147)

## 2013-04-19 LAB — CBC AND DIFFERENTIAL: Hemoglobin: 12.1 g/dL (ref 12.0–16.0)

## 2013-04-20 ENCOUNTER — Non-Acute Institutional Stay: Payer: Medicare Other | Admitting: Nurse Practitioner

## 2013-04-20 DIAGNOSIS — F32A Depression, unspecified: Secondary | ICD-10-CM

## 2013-04-20 DIAGNOSIS — M62838 Other muscle spasm: Secondary | ICD-10-CM

## 2013-04-20 DIAGNOSIS — E039 Hypothyroidism, unspecified: Secondary | ICD-10-CM

## 2013-04-20 DIAGNOSIS — R269 Unspecified abnormalities of gait and mobility: Secondary | ICD-10-CM

## 2013-04-20 DIAGNOSIS — E871 Hypo-osmolality and hyponatremia: Secondary | ICD-10-CM

## 2013-04-20 DIAGNOSIS — R609 Edema, unspecified: Secondary | ICD-10-CM

## 2013-04-20 DIAGNOSIS — F0280 Dementia in other diseases classified elsewhere without behavioral disturbance: Secondary | ICD-10-CM

## 2013-04-20 DIAGNOSIS — I10 Essential (primary) hypertension: Secondary | ICD-10-CM

## 2013-04-20 DIAGNOSIS — F329 Major depressive disorder, single episode, unspecified: Secondary | ICD-10-CM

## 2013-04-20 DIAGNOSIS — M546 Pain in thoracic spine: Secondary | ICD-10-CM

## 2013-04-20 DIAGNOSIS — G4700 Insomnia, unspecified: Secondary | ICD-10-CM

## 2013-04-20 DIAGNOSIS — J209 Acute bronchitis, unspecified: Secondary | ICD-10-CM

## 2013-04-20 DIAGNOSIS — D72829 Elevated white blood cell count, unspecified: Secondary | ICD-10-CM

## 2013-04-20 LAB — BASIC METABOLIC PANEL
BUN: 26 mg/dL — AB (ref 4–21)
Potassium: 4.3 mmol/L (ref 3.4–5.3)
Sodium: 136 mmol/L — AB (ref 137–147)

## 2013-04-20 NOTE — Assessment & Plan Note (Addendum)
Dcd Maxzide--no s/s of edema or CHF decompensation, f/u BMP still showed lower Na 124 04/09/13, increased Gatorade 240mg  up to 6x/day didn't help,  repeat BMP showed Na 123 04/18/13--improved to 136 after NS IVF 75cc/hr for 2000cc. Her confusion and muscle spasm are improved w/o Clonazepam. Will repeat BMP in one week. May treat with Demeclocycline for clinically presumed SIADH if sodium drops again.

## 2013-04-20 NOTE — Assessment & Plan Note (Signed)
Continue with Mirtazapine 7.5mg   

## 2013-04-20 NOTE — Assessment & Plan Note (Signed)
Sleep, muscle spasm, confusion all improved after IVF and sodium corrected, continue Mirtazapine for now.

## 2013-04-20 NOTE — Assessment & Plan Note (Signed)
improved insomnia and muscle spasm after serum Na was corrected, dc Clonazepam.

## 2013-04-20 NOTE — Assessment & Plan Note (Signed)
Controlled on Losartan 100mg and Metoprolol 50mg       

## 2013-04-20 NOTE — Assessment & Plan Note (Addendum)
Rocephin 1gm IM completed. It was related to acute bronchitis. Repeated wbc trending down to 10.7--it should normalize. F/u CBC

## 2013-04-20 NOTE — Progress Notes (Signed)
Patient ID: [name], female   DOB: 12-05-1913, 79 y.o.   MRN: [id number]  Chief Complaint:  Chief Complaint  Patient presents with  . Medical Managment of Chronic Issues    confusion, insomnia, hyponatremia.      HPI:   Problem List Items Addressed This Visit     ICD-9-CM   Hypertension     Controlled on Losartan 100mg  and Metoprolol 50mg         Depression     Sleep, muscle spasm, confusion all improved after IVF and sodium corrected, continue Mirtazapine for now.         Alzheimer's disease     Baseline mentation now,  takes Namenda and Aricept.  MMSE 11/30 03/21/13        Spasm of muscle     improved insomnia and muscle spasm after serum Na was corrected, dc Clonazepam.         Back pain, thoracic     Posterior right lower rib cage region-resolved, CXR, Rib series of the right 04/07/13 showed no acute fracture or lytic destructive lesion, mild to moderate bronchitis at the mid and lower lungs bilaterally, Cath UA and C/S 04/09/13 showed no growth. Had Urgent Care 04/06/13 pm. Had X-ray R hip/L spine: unremarkable. Takes Aleve 220mg  daily--mild pain only.         Leukocytosis, unspecified     Rocephin 1gm IM completed. It was related to acute bronchitis. Repeated wbc 10.7--it should normalize.         Hyponatremia - Primary (Chronic)     Dcd Maxzide--no s/s of edema or CHF decompensation, f/u BMP still showed lower Na 124 04/09/13, increased Gatorade 240mg  up to 6x/day didn't help,  repeat BMP showed Na 123 04/18/13--improved to 136 after NS IVF 75cc/hr for 2000cc. Her confusion and muscle spasm are improved w/o Clonazepam. Will repeat BMP in one week. May treat with Demeclocycline for clinically presumed SIADH if sodium drops again.            Review of Systems:  Review of Systems  Constitutional: Positive for malaise/fatigue. Negative for fever, chills, weight loss and diaphoresis.  HENT: Positive for hearing loss. Negative for ear pain, congestion,  sore throat and neck pain.   Eyes: Negative for pain, discharge and redness.  Respiratory: Negative for cough, sputum production, shortness of breath and wheezing.   Cardiovascular: Negative for chest pain, palpitations, orthopnea, claudication, leg swelling and PND.  Gastrointestinal: Negative for heartburn, nausea, vomiting, abdominal pain, diarrhea, constipation, blood in stool and melena.  Genitourinary: Positive for frequency. Negative for dysuria, urgency, hematuria and flank pain.  Musculoskeletal: Positive for falls. Negative for myalgias, back pain and joint pain.  Skin: Negative for itching and rash.  Neurological: Positive for weakness (generalized. ). Negative for dizziness, tingling, tremors, sensory change, speech change, focal weakness, seizures, loss of consciousness and headaches.  Endo/Heme/Allergies: Negative for environmental allergies and polydipsia. Does not bruise/bleed easily.  Psychiatric/Behavioral: Positive for depression and memory loss. Negative for hallucinations. The patient has insomnia. The patient is not nervous/anxious.      Medications: Patient's Medications  New Prescriptions   No medications on file  Previous Medications   ASPIRIN 81 MG TABLET    Take 81 mg by mouth daily.   CALCIUM CARBONATE-VITAMIN D (CALCIUM 500 + D) 500-125 MG-UNIT TABS    Take 1 tablet by mouth. Take one daily   DONEPEZIL (ARICEPT) 5 MG TABLET    Take 5 mg by mouth at bedtime as needed.  LEVOTHYROXINE (SYNTHROID, LEVOTHROID) 25 MCG TABLET    Take 25 mcg by mouth daily before breakfast.   LOSARTAN (COZAAR) 100 MG TABLET    Take 100 mg by mouth daily.   MEMANTINE (NAMENDA) 5 MG TABLET    Take 5 mg by mouth 2 (two) times daily.   METOPROLOL (LOPRESSOR) 50 MG TABLET    Take 50 mg by mouth. Take one daily for blood pressure   MIRTAZAPINE (REMERON) 15 MG TABLET    Take 15 mg by mouth. Take 1/2 tablet daily   MULTIPLE VITAMINS-MINERALS (CERTAVITE SENIOR/ANTIOXIDANT PO)    Take 1 tablet  by mouth. Take one daily   OMEPRAZOLE (PRILOSEC) 20 MG CAPSULE    Take 20 mg by mouth. Take one twice daily   TRIAMTERENE-HYDROCHLOROTHIAZIDE (MAXZIDE-25) 37.5-25 MG PER TABLET    Take 1 tablet by mouth daily.  Modified Medications   No medications on file  Discontinued Medications   No medications on file     Physical Exam: Physical Exam  Constitutional: She appears well-developed and well-nourished. No distress.  HENT:  Head: Normocephalic and atraumatic.  Nose: Nose normal.  Mouth/Throat: No oropharyngeal exudate.  Eyes: Conjunctivae and EOM are normal. Pupils are equal, round, and reactive to light. Right eye exhibits no discharge. Left eye exhibits no discharge. No scleral icterus.  Neck: Normal range of motion. Neck supple. No JVD present. No thyromegaly present.  Cardiovascular: Normal rate, regular rhythm and normal heart sounds.   No murmur heard. Pulmonary/Chest: Effort normal and breath sounds normal. No respiratory distress. She has no wheezes. She has no rales.  Abdominal: Soft. Bowel sounds are normal. She exhibits no distension. There is no tenderness.  Musculoskeletal: She exhibits edema (trace BLE). She exhibits no tenderness (posterior lower rib cage pain when palpated or with deep breath).  Anterior left upper chest size 3x3cm pain when palpated, but not with deep breath or truncal movement. No bruise seen.   Lymphadenopathy:    She has no cervical adenopathy.  Neurological: She is alert. She has normal reflexes. She displays normal reflexes. No cranial nerve deficit. She exhibits normal muscle tone. Coordination normal.  Skin: Skin is warm and dry. No rash noted. She is not diaphoretic. No erythema. No pallor.  Psychiatric: Her mood appears not anxious. Her affect is not angry, not blunt, not labile and not inappropriate. Her speech is delayed. Her speech is not rapid and/or pressured, not tangential and not slurred. She is slowed and withdrawn. She is not agitated,  not aggressive, not hyperactive and not combative. Thought content is not paranoid and not delusional. Cognition and memory are impaired. She expresses impulsivity and inappropriate judgment. She exhibits a depressed mood. She is communicative. She exhibits abnormal recent memory and abnormal remote memory.       Labs reviewed: Basic Metabolic Panel:  Recent Labs  16/10/96 04/06/13 1729  04/18/13 04/19/13 04/20/13  NA 129* 123*  < > 123* 135* 136*  K 4.2 4.0  < > 4.0 4.2 4.3  CL  --  87*  --   --   --   --   CO2  --  27  --   --   --   --   GLUCOSE  --  110*  --   --   --   --   BUN 20 19  < > 26* 9 26*  CREATININE 1.2* 1.03  < > 1.0 0.9 0.8  CALCIUM  --  9.9  --   --   --   --  TSH 2.90  --   --   --   --   --   < > = values in this interval not displayed.  Liver Function Tests:  Recent Labs  04/05/13  AST 20  ALT 10  ALKPHOS 95    CBC:  Recent Labs  04/05/13 04/06/13 1731 04/19/13  WBC 14.5 13.4* 10.7  HGB 13.4 13.0 12.1  HCT 39 40.1 36  MCV  --  91.2  --   PLT  --   --  311    Anemia Panel: No results found for this basename: IRON, FOLATE, VITAMINB12,  in the last 8760 hours  Significant Diagnostic Results:  04/19/13 CXR patchy bibasilar atelectasis or pneumonitis appears new. Previously described mild to moderate bronchitis at the mild and lower lungs appear improved.    Assessment/Plan Hyponatremia Dcd Maxzide--no s/s of edema or CHF decompensation, f/u BMP still showed lower Na 124 04/09/13, increased Gatorade 240mg  up to 6x/day didn't help,  repeat BMP showed Na 123 04/18/13--improved to 136 after NS IVF 75cc/hr for 2000cc. Her confusion and muscle spasm are improved w/o Clonazepam. Will repeat BMP in one week. May treat with Demeclocycline for clinically presumed SIADH if sodium drops again.       Hypertension Controlled on Losartan 100mg  and Metoprolol 50mg       Depression Sleep, muscle spasm, confusion all improved after IVF and sodium  corrected, continue Mirtazapine for now.       Alzheimer's disease Baseline mentation now,  takes Namenda and Aricept.  MMSE 11/30 03/21/13      Spasm of muscle improved insomnia and muscle spasm after serum Na was corrected, dc Clonazepam.       Back pain, thoracic Posterior right lower rib cage region-resolved, CXR, Rib series of the right 04/07/13 showed no acute fracture or lytic destructive lesion, mild to moderate bronchitis at the mid and lower lungs bilaterally, Cath UA and C/S 04/09/13 showed no growth. Had Urgent Care 04/06/13 pm. Had X-ray R hip/L spine: unremarkable. Takes Aleve 220mg  daily--mild pain only.       Leukocytosis, unspecified Rocephin 1gm IM completed. It was related to acute bronchitis. Repeated wbc 10.7--it should normalize.           Family/ staff Communication: observe for s/s confusion, muscle spasm, insomnia, back pain.    Goals of care: AL   Labs/tests ordered CXR, BMP

## 2013-04-20 NOTE — Assessment & Plan Note (Addendum)
Posterior right lower rib cage region-resolved, CXR, Rib series of the right 04/07/13 showed no acute fracture or lytic destructive lesion, mild to moderate bronchitis at the mid and lower lungs bilaterally, Cath UA and C/S 04/09/13 showed no growth. Had Urgent Care 04/06/13 pm. Had X-ray R hip/L spine: unremarkable. Takes Aleve 220mg  daily--mild pain only. Now c/o anterior upper left chest wall small area 3x3 cm pain with palpation and not with deep breath--repeat Rib Xray and monitor her pain.

## 2013-04-20 NOTE — Assessment & Plan Note (Signed)
Baseline mentation now,  takes Namenda and Aricept.  MMSE 11/30 03/21/13

## 2013-04-20 NOTE — Assessment & Plan Note (Signed)
W/c to and from dinning room, continue to work with PT 

## 2013-04-20 NOTE — Assessment & Plan Note (Signed)
Takes Synthroid 25mcg daily. TSH 2.900 04/05/13                       

## 2013-04-21 LAB — BASIC METABOLIC PANEL
Glucose: 99 mg/dL
Potassium: 3.9 mmol/L (ref 3.4–5.3)
Sodium: 134 mmol/L — AB (ref 137–147)

## 2013-04-21 LAB — CBC AND DIFFERENTIAL: Platelets: 309 10*3/uL (ref 150–399)

## 2013-04-28 ENCOUNTER — Non-Acute Institutional Stay: Payer: Medicare Other | Admitting: Nurse Practitioner

## 2013-04-28 DIAGNOSIS — F0280 Dementia in other diseases classified elsewhere without behavioral disturbance: Secondary | ICD-10-CM

## 2013-04-28 DIAGNOSIS — E039 Hypothyroidism, unspecified: Secondary | ICD-10-CM

## 2013-04-28 DIAGNOSIS — G4700 Insomnia, unspecified: Secondary | ICD-10-CM

## 2013-04-28 DIAGNOSIS — B377 Candidal sepsis: Secondary | ICD-10-CM

## 2013-04-28 DIAGNOSIS — D72829 Elevated white blood cell count, unspecified: Secondary | ICD-10-CM

## 2013-04-28 DIAGNOSIS — M546 Pain in thoracic spine: Secondary | ICD-10-CM

## 2013-04-28 DIAGNOSIS — E871 Hypo-osmolality and hyponatremia: Secondary | ICD-10-CM

## 2013-04-28 DIAGNOSIS — I10 Essential (primary) hypertension: Secondary | ICD-10-CM

## 2013-04-28 DIAGNOSIS — J209 Acute bronchitis, unspecified: Secondary | ICD-10-CM

## 2013-04-28 DIAGNOSIS — F329 Major depressive disorder, single episode, unspecified: Secondary | ICD-10-CM

## 2013-04-28 DIAGNOSIS — R609 Edema, unspecified: Secondary | ICD-10-CM

## 2013-04-28 LAB — BASIC METABOLIC PANEL: Creatinine: 1.1 mg/dL (ref 0.5–1.1)

## 2013-04-28 NOTE — Assessment & Plan Note (Signed)
Posterior right lower rib cage region-resolved, CXR, Rib series of the right 04/07/13 showed no acute fracture. Had Urgent Care 04/06/13 pm. Had X-ray R hip/L spine: unremarkable. Takes Aleve 220mg  daily--mild pain only. Now c/o anterior upper left chest wall small area 3x3 cm pain with palpation and not with deep breath--resolved.

## 2013-04-28 NOTE — Assessment & Plan Note (Signed)
Rocephin 1gm IM completed. It was related to acute bronchitis. Repeated wbc trending down to 10.7--persisted with 11.2 04/21/13--repeat CBC in one week.

## 2013-04-28 NOTE — Assessment & Plan Note (Signed)
Mid-lower back, heat, moist, ABT contributed to it, apply Mycolog II bid until healed.

## 2013-04-28 NOTE — Assessment & Plan Note (Signed)
Continue with Mirtazapine 7.5mg 

## 2013-04-28 NOTE — Assessment & Plan Note (Signed)
Takes Synthroid 25mcg daily. TSH 2.900 04/05/13                       

## 2013-04-28 NOTE — Assessment & Plan Note (Signed)
Baseline mentation now,  takes Namenda and Aricept.  MMSE 11/30 03/21/13     

## 2013-04-28 NOTE — Assessment & Plan Note (Signed)
Dcd Maxzide--no s/s of edema or CHF decompensation, f/u BMP still showed lower Na 124 04/09/13, increased Gatorade 240mg  up to 6x/day didn't help,  repeat BMP showed Na 123 04/18/13--improved to 136 after NS IVF 75cc/hr for 2000cc. Her confusion and muscle spasm are improved w/o Clonazepam. Repeated BMP in one week Na 134 04/21/13 . Update BMP in one week.

## 2013-04-28 NOTE — Assessment & Plan Note (Signed)
Sleep, muscle spasm, confusion all improved after IVF and sodium corrected, continue Mirtazapine for now.      

## 2013-04-28 NOTE — Progress Notes (Signed)
Patient ID: [name], female   DOB: [birth year], 79 y.o.   MRN: [id number]  Chief Complaint:  Chief Complaint  Patient presents with  . Medical Managment of Chronic Issues    hyponatremia, edema, confusion. back rash     HPI:   Problem List Items Addressed This Visit   Hypertension     Controlled on Losartan 100mg  and Metoprolol 50mg           Depression     Sleep, muscle spasm, confusion all improved after IVF and sodium corrected, continue Mirtazapine for now.           Alzheimer's disease     Baseline mentation now,  takes Namenda and Aricept.  MMSE 11/30 03/21/13          Back pain, thoracic     Posterior right lower rib cage region-resolved, CXR, Rib series of the right 04/07/13 showed no acute fracture. Had Urgent Care 04/06/13 pm. Had X-ray R hip/L spine: unremarkable. Takes Aleve 220mg  daily--mild pain only. Now c/o anterior upper left chest wall small area 3x3 cm pain with palpation and not with deep breath--resolved.           Leukocytosis, unspecified     Rocephin 1gm IM completed. It was related to acute bronchitis. Repeated wbc trending down to 10.7--persisted with 11.2 04/21/13--repeat CBC in one week.           Unspecified hypothyroidism     Takes Synthroid daily. TSH 2.900 04/05/13          Insomnia     Continue with Mirtazapine 7.5mg       Acute bronchitis   Hyponatremia - Primary (Chronic)     Dcd Maxzide--no s/s of edema or CHF decompensation, f/u BMP still showed lower Na 124 04/09/13, increased Gatorade 240mg  up to 6x/day didn't help,  repeat BMP showed Na 123 04/18/13--improved to 136 after NS IVF 75cc/hr for 2000cc. Her confusion and muscle spasm are improved w/o Clonazepam. Repeated BMP in one week Na 134 04/21/13 . Update BMP in one week.           Edema (Chronic)   Disseminated candidiasis (Chronic)     Mid-lower back, heat, moist, ABT contributed to it, apply Mycolog II bid until healed.        Review of  Systems:  Review of Systems  Constitutional: Positive for malaise/fatigue. Negative for fever, chills, weight loss and diaphoresis.  HENT: Positive for hearing loss. Negative for ear pain, congestion, sore throat and neck pain.   Eyes: Negative for pain, discharge and redness.  Respiratory: Negative for cough, sputum production, shortness of breath and wheezing.   Cardiovascular: Positive for leg swelling. Negative for chest pain, palpitations, orthopnea, claudication and PND.  Gastrointestinal: Negative for heartburn, nausea, vomiting, abdominal pain, diarrhea, constipation, blood in stool and melena.  Genitourinary: Positive for frequency (ua r/o UTI). Negative for dysuria, urgency, hematuria and flank pain.  Musculoskeletal: Positive for falls. Negative for myalgias, back pain and joint pain.  Skin: Negative for itching and rash.  Neurological: Negative for dizziness, tingling, tremors, sensory change, speech change, focal weakness, seizures, loss of consciousness and headaches. Weakness: generalized. improved.   Endo/Heme/Allergies: Negative for environmental allergies and polydipsia. Does not bruise/bleed easily.  Psychiatric/Behavioral: Positive for depression and memory loss. Negative for hallucinations. The patient is not nervous/anxious. Insomnia: better.      Medications: Patient's Medications  New Prescriptions   No medications on file  Previous Medications   ASPIRIN 81 MG TABLET  Take 81 mg by mouth daily.   CALCIUM CARBONATE-VITAMIN D (CALCIUM 500 + D) 500-125 MG-UNIT TABS    Take 1 tablet by mouth. Take one daily   DONEPEZIL (ARICEPT) 5 MG TABLET    Take 5 mg by mouth at bedtime as needed.   LEVOTHYROXINE (SYNTHROID, LEVOTHROID) 25 MCG TABLET    Take 25 mcg by mouth daily before breakfast.   LOSARTAN (COZAAR) 100 MG TABLET    Take 100 mg by mouth daily.   MEMANTINE (NAMENDA) 5 MG TABLET    Take 5 mg by mouth 2 (two) times daily.   METOPROLOL (LOPRESSOR) 50 MG TABLET     Take 50 mg by mouth. Take one daily for blood pressure   MIRTAZAPINE (REMERON) 15 MG TABLET    Take 15 mg by mouth. Take 1/2 tablet daily   MULTIPLE VITAMINS-MINERALS (CERTAVITE SENIOR/ANTIOXIDANT PO)    Take 1 tablet by mouth. Take one daily   OMEPRAZOLE (PRILOSEC) 20 MG CAPSULE    Take 20 mg by mouth. Take one twice daily   TRIAMTERENE-HYDROCHLOROTHIAZIDE (MAXZIDE-25) 37.5-25 MG PER TABLET    Take 1 tablet by mouth daily.  Modified Medications   No medications on file  Discontinued Medications   No medications on file     Physical Exam: Physical Exam  Constitutional: She appears well-developed and well-nourished. No distress.  HENT:  Head: Normocephalic and atraumatic.  Nose: Nose normal.  Mouth/Throat: No oropharyngeal exudate.  Eyes: Conjunctivae and EOM are normal. Pupils are equal, round, and reactive to light. Right eye exhibits no discharge. Left eye exhibits no discharge. No scleral icterus.  Neck: Normal range of motion. Neck supple. No JVD present. No thyromegaly present.  Cardiovascular: Normal rate, regular rhythm and normal heart sounds.   No murmur heard. Pulmonary/Chest: Effort normal. No respiratory distress. She has no wheezes. She has rales (bibasilar).  Abdominal: Soft. Bowel sounds are normal. She exhibits no distension. There is no tenderness.  Musculoskeletal: She exhibits edema (trace BLE). She exhibits no tenderness (posterior lower rib cage pain when palpated or with deep breath).  Anterior left upper chest size 3x3cm pain when palpated, but not with deep breath or truncal movement. No bruise seen.   Lymphadenopathy:    She has no cervical adenopathy.  Neurological: She is alert. She has normal reflexes. She displays normal reflexes. No cranial nerve deficit. She exhibits normal muscle tone. Coordination normal.  Skin: Skin is warm and dry. No rash noted. She is not diaphoretic. No erythema. No pallor.  Psychiatric: Her mood appears not anxious. Her affect is  not angry, not blunt, not labile and not inappropriate. Her speech is delayed. Her speech is not rapid and/or pressured, not tangential and not slurred. She is slowed and withdrawn. She is not agitated, not aggressive, not hyperactive and not combative. Thought content is not paranoid and not delusional. Cognition and memory are impaired. She expresses impulsivity and inappropriate judgment. She exhibits a depressed mood. She is communicative. She exhibits abnormal recent memory and abnormal remote memory.     There were no vitals filed for this visit.    Labs reviewed: Basic Metabolic Panel:  Recent Labs  40/98/11 04/06/13 1729  04/19/13 04/20/13 04/21/13  NA 129* 123*  < > 135* 136* 134*  K 4.2 4.0  < > 4.2 4.3 3.9  CL  --  87*  --   --   --   --   CO2  --  27  --   --   --   --  GLUCOSE  --  110*  --   --   --   --   BUN 20 19  < > 9 26* 10  CREATININE 1.2* 1.03  < > 0.9 0.8 0.9  CALCIUM  --  9.9  --   --   --   --   TSH 2.90  --   --   --   --   --   < > = values in this interval not displayed.  Liver Function Tests:  Recent Labs  04/05/13  AST 20  ALT 10  ALKPHOS 95    CBC:  Recent Labs  04/05/13 04/06/13 1731 04/19/13 04/21/13  WBC 14.5 13.4* 10.7 11.2  HGB 13.4 13.0 12.1 12.1  HCT 39 40.1 36 35*  MCV  --  91.2  --   --   PLT  --   --  311 309    Anemia Panel: No results found for this basename: IRON, FOLATE, VITAMINB12,  in the last 8760 hours  UA  04/26/13 insignificant growth.   Significant Diagnostic Results:   04/27/13 CXR minimal bronchitis at the perihilar regions further improved. Patchy bibasilar atelectasis or pneumonitis appears worse in the interval. Minimal cardiomegaly slightly increased without pulmonary vascular congestion or pleural effusion.   Assessment/Plan Hyponatremia Dcd Maxzide--no s/s of edema or CHF decompensation, f/u BMP still showed lower Na 124 04/09/13, increased Gatorade 240mg  up to 6x/day didn't help,  repeat BMP showed  Na 123 04/18/13--improved to 136 after NS IVF 75cc/hr for 2000cc. Her confusion and muscle spasm are improved w/o Clonazepam. Repeated BMP in one week Na 134 04/21/13 . Update BMP in one week.         Hypertension Controlled on Losartan 100mg  and Metoprolol 50mg         Depression Sleep, muscle spasm, confusion all improved after IVF and sodium corrected, continue Mirtazapine for now.         Alzheimer's disease Baseline mentation now,  takes Namenda and Aricept.  MMSE 11/30 03/21/13        Back pain, thoracic Posterior right lower rib cage region-resolved, CXR, Rib series of the right 04/07/13 showed no acute fracture. Had Urgent Care 04/06/13 pm. Had X-ray R hip/L spine: unremarkable. Takes Aleve 220mg  daily--mild pain only. Now c/o anterior upper left chest wall small area 3x3 cm pain with palpation and not with deep breath--resolved.         Leukocytosis, unspecified Rocephin 1gm IM completed. It was related to acute bronchitis. Repeated wbc trending down to 10.7--persisted with 11.2 04/21/13--repeat CBC in one week.         Unspecified hypothyroidism Takes Synthroid daily. TSH 2.900 04/05/13        Insomnia Continue with Mirtazapine 7.5mg     Disseminated candidiasis Mid-lower back, heat, moist, ABT contributed to it, apply Mycolog II bid until healed.       Family/ staff Communication: monitor the patient.    Goals of care: AL for now.    Labs/tests ordered CXR, BMP, BNP, CBC

## 2013-04-28 NOTE — Assessment & Plan Note (Signed)
Controlled on Losartan 100mg  and Metoprolol 50mg 

## 2013-05-04 ENCOUNTER — Non-Acute Institutional Stay: Payer: Medicare Other | Admitting: Nurse Practitioner

## 2013-05-04 DIAGNOSIS — I10 Essential (primary) hypertension: Secondary | ICD-10-CM

## 2013-05-04 DIAGNOSIS — E039 Hypothyroidism, unspecified: Secondary | ICD-10-CM

## 2013-05-04 DIAGNOSIS — J209 Acute bronchitis, unspecified: Secondary | ICD-10-CM

## 2013-05-04 DIAGNOSIS — W19XXXD Unspecified fall, subsequent encounter: Secondary | ICD-10-CM

## 2013-05-04 DIAGNOSIS — R609 Edema, unspecified: Secondary | ICD-10-CM

## 2013-05-04 DIAGNOSIS — W19XXXA Unspecified fall, initial encounter: Secondary | ICD-10-CM

## 2013-05-04 DIAGNOSIS — D72829 Elevated white blood cell count, unspecified: Secondary | ICD-10-CM

## 2013-05-04 DIAGNOSIS — F0280 Dementia in other diseases classified elsewhere without behavioral disturbance: Secondary | ICD-10-CM

## 2013-05-04 DIAGNOSIS — Z9181 History of falling: Secondary | ICD-10-CM | POA: Insufficient documentation

## 2013-05-04 DIAGNOSIS — F329 Major depressive disorder, single episode, unspecified: Secondary | ICD-10-CM

## 2013-05-04 DIAGNOSIS — E871 Hypo-osmolality and hyponatremia: Secondary | ICD-10-CM

## 2013-05-04 DIAGNOSIS — M62838 Other muscle spasm: Secondary | ICD-10-CM

## 2013-05-04 DIAGNOSIS — G4700 Insomnia, unspecified: Secondary | ICD-10-CM

## 2013-05-04 DIAGNOSIS — F32A Depression, unspecified: Secondary | ICD-10-CM

## 2013-05-04 NOTE — Assessment & Plan Note (Signed)
Baseline mentation now,  takes Namenda and Aricept.  MMSE 11/30 03/21/13     

## 2013-05-04 NOTE — Assessment & Plan Note (Signed)
Takes Synthroid 25mcg daily. TSH 2.900 04/05/13                       

## 2013-05-04 NOTE — Assessment & Plan Note (Signed)
Trace in ankles and bibasilar rales auscultated still present today, will increase Maxzide 37.5/25mg  3x/week, BMP/BNP in one week.

## 2013-05-04 NOTE — Assessment & Plan Note (Addendum)
Larey Seat x2 04/29/13-no apparent injury noted. Will change Clonazepam to prn(was dc'd and somehow resumed after the patient was transferred back to [facility])

## 2013-05-04 NOTE — Assessment & Plan Note (Signed)
Rocephin 1gm IM completed. It was related to acute bronchitis. Repeated wbc trending down to 10.7--persisted with 11.2 04/21/13--repeat CBC in one week-pending.

## 2013-05-04 NOTE — Assessment & Plan Note (Signed)
Elevated Bps-180/88, 176/92, 188/80, 160/72, 160/98, 170/78 on Losartan 100mg  and will increase  Metoprolol to 50mg  bid, monitor Bp daily

## 2013-05-04 NOTE — Progress Notes (Signed)
Patient ID: [name], female   DOB: [birth year], 79 y.o.   MRN: [id number]  Chief Complaint:  Chief Complaint  Patient presents with  . Medical Managment of Chronic Issues    falls, elevated blood pressure.      HPI:  Problem List Items Addressed This Visit   Hyponatremia - Primary (Chronic)     Resolved, serum Na 136 04/28/13, Triamterene/HCTZ 27.5/25 2x/week resumed 04/28/13--increased to 3x/week 05/04/13-BMP in one week.            Edema (Chronic)     Trace in ankles and bibasilar rales auscultated still present today, will increase Maxzide 37.5/25mg  3x/week, BMP/BNP in one week.     Hypertension     Elevated Bps-180/88, 176/92, 188/80, 160/72, 160/98, 170/78 on Losartan 100mg  and will increase  Metoprolol to 50mg  bid, monitor Bp daily            Depression     Sleep, muscle spasm, confusion all improved after IVF and sodium corrected, continue Mirtazapine for now.             Alzheimer's disease     Baseline mentation now,  takes Namenda and Aricept.  MMSE 11/30 03/21/13            Spasm of muscle     improved insomnia and muscle spasm after serum Na was corrected, dc'd Clonazepam in the past due to AR of more confusion--but apparently the patient is still taking 1mg  nightly since she returned to [facility] 04/20/13. This may also contribute to her falling--will change Clonazepam to prn.           Leukocytosis, unspecified     Rocephin 1gm IM completed. It was related to acute bronchitis. Repeated wbc trending down to 10.7--persisted with 11.2 04/21/13--repeat CBC in one week-pending.             Unspecified hypothyroidism     Takes Synthroid daily. TSH 2.900 04/05/13          Insomnia     Continue with Mirtazapine 7.5mg  and continue to be problematic. Clonazepam made her more confused in the past and questionable efficacy in managing her insomnia since it was resumed since she has returned to [facility] 04/20/13--change it to prn. Hesitate to  add hypnotic due to  her frequently falling.  Will try to increase Mirtazapine to 15mg  nightly later.         Acute bronchitis     Fully treated with Rocephin, repeated CXR 04/19/13 showed improved mild to moderate bronchitis at the mid and lower lungs. New patchy bibasilar atelectasis or pneumonitis appears new--will observe the patient and repeat CXR in one week 04/27/13 showed patchy bibasilar atelectasis or pneumonitis appears worse in the interval--repeat CXR in one week-pending.     Fall     Larey Seat x2 04/29/13-no apparent injury noted. Will change Clonazepam to prn(was dc'd and somehow resumed after the patient was transferred back to [facility])       Review of Systems:  Review of Systems  Constitutional: Positive for malaise/fatigue. Negative for fever, chills, weight loss and diaphoresis.  HENT: Positive for hearing loss. Negative for ear pain, congestion, sore throat and neck pain.   Eyes: Negative for pain, discharge and redness.  Respiratory: Negative for cough, sputum production, shortness of breath and wheezing.   Cardiovascular: Positive for leg swelling. Negative for chest pain, palpitations, orthopnea, claudication and PND.  Gastrointestinal: Negative for heartburn, nausea, vomiting, abdominal pain, diarrhea, constipation, blood in stool and melena.  Genitourinary: Positive for frequency (ua r/o UTI). Negative for dysuria, urgency, hematuria and flank pain.  Musculoskeletal: Positive for falls. Negative for myalgias, back pain and joint pain.  Skin: Negative for itching and rash.  Neurological: Negative for dizziness, tingling, tremors, sensory change, speech change, focal weakness, seizures, loss of consciousness and headaches. Weakness: generalized. improved.   Endo/Heme/Allergies: Negative for environmental allergies and polydipsia. Does not bruise/bleed easily.  Psychiatric/Behavioral: Positive for depression and memory loss. Negative for hallucinations. The patient is not  nervous/anxious. Insomnia: better.      Medications: Patient's Medications  New Prescriptions   No medications on file  Previous Medications   ASPIRIN 81 MG TABLET    Take 81 mg by mouth daily.   CALCIUM CARBONATE-VITAMIN D (CALCIUM 500 + D) 500-125 MG-UNIT TABS    Take 1 tablet by mouth. Take one daily   DONEPEZIL (ARICEPT) 5 MG TABLET    Take 5 mg by mouth at bedtime as needed.   LEVOTHYROXINE (SYNTHROID, LEVOTHROID) 25 MCG TABLET    Take 25 mcg by mouth daily before breakfast.   LOSARTAN (COZAAR) 100 MG TABLET    Take 100 mg by mouth daily.   MEMANTINE (NAMENDA) 5 MG TABLET    Take 5 mg by mouth 2 (two) times daily.   METOPROLOL (LOPRESSOR) 50 MG TABLET    Take 50 mg by mouth. Take one daily for blood pressure   MIRTAZAPINE (REMERON) 15 MG TABLET    Take 15 mg by mouth. Take 1/2 tablet daily   MULTIPLE VITAMINS-MINERALS (CERTAVITE SENIOR/ANTIOXIDANT PO)    Take 1 tablet by mouth. Take one daily   OMEPRAZOLE (PRILOSEC) 20 MG CAPSULE    Take 20 mg by mouth. Take one twice daily   TRIAMTERENE-HYDROCHLOROTHIAZIDE (MAXZIDE-25) 37.5-25 MG PER TABLET    Take 1 tablet by mouth daily.  Modified Medications   No medications on file  Discontinued Medications   No medications on file     Physical Exam: Physical Exam  Constitutional: She appears well-developed and well-nourished. No distress.  HENT:  Head: Normocephalic and atraumatic.  Nose: Nose normal.  Mouth/Throat: No oropharyngeal exudate.  Eyes: Conjunctivae and EOM are normal. Pupils are equal, round, and reactive to light. Right eye exhibits no discharge. Left eye exhibits no discharge. No scleral icterus.  Neck: Normal range of motion. Neck supple. No JVD present. No thyromegaly present.  Cardiovascular: Normal rate, regular rhythm and normal heart sounds.   No murmur heard. Pulmonary/Chest: Effort normal. No respiratory distress. She has no wheezes. She has rales (bibasilar).  Abdominal: Soft. Bowel sounds are normal. She  exhibits no distension. There is no tenderness.  Musculoskeletal: She exhibits edema (trace BLE). She exhibits no tenderness (posterior lower rib cage pain when palpated or with deep breath).  Anterior left upper chest size 3x3cm pain when palpated, but not with deep breath or truncal movement. No bruise seen.   Lymphadenopathy:    She has no cervical adenopathy.  Neurological: She is alert. She has normal reflexes. She displays normal reflexes. No cranial nerve deficit. She exhibits normal muscle tone. Coordination normal.  Skin: Skin is warm and dry. No rash noted. She is not diaphoretic. No erythema. No pallor.  Psychiatric: Her mood appears not anxious. Her affect is not angry, not blunt, not labile and not inappropriate. Her speech is delayed. Her speech is not rapid and/or pressured, not tangential and not slurred. She is slowed and withdrawn. She is not agitated, not aggressive, not hyperactive and not combative. Thought  content is not paranoid and not delusional. Cognition and memory are impaired. She expresses impulsivity and inappropriate judgment. She exhibits a depressed mood. She is communicative. She exhibits abnormal recent memory and abnormal remote memory.     Filed Vitals:   05/04/13 1505  BP: 188/80  Pulse: 88  Temp: 97.1 F (36.2 C)  TempSrc: Tympanic  Resp: 22      Labs reviewed: Basic Metabolic Panel:  Recent Labs  30/86/57 04/06/13 1729  04/20/13 04/21/13 04/28/13  NA 129* 123*  < > 136* 134* 136*  K 4.2 4.0  < > 4.3 3.9 4.0  CL  --  87*  --   --   --   --   CO2  --  27  --   --   --   --   GLUCOSE  --  110*  --   --   --   --   BUN 20 19  < > 26* 10 13  CREATININE 1.2* 1.03  < > 0.8 0.9 1.1  CALCIUM  --  9.9  --   --   --   --   TSH 2.90  --   --   --   --   --   < > = values in this interval not displayed.  Liver Function Tests:  Recent Labs  04/05/13  AST 20  ALT 10  ALKPHOS 95    CBC:  Recent Labs  04/05/13 04/06/13 1731 04/19/13  04/21/13  WBC 14.5 13.4* 10.7 11.2  HGB 13.4 13.0 12.1 12.1  HCT 39 40.1 36 35*  MCV  --  91.2  --   --   PLT  --   --  311 309    Anemia Panel: No results found for this basename: IRON, FOLATE, VITAMINB12,  in the last 8760 hours  Significant Diagnostic Results:     Assessment/Plan Hyponatremia Resolved, serum Na 136 04/28/13, Triamterene/HCTZ 27.5/25 2x/week resumed 04/28/13--increased to 3x/week 05/04/13-BMP in one week.          Edema Trace in ankles and bibasilar rales auscultated still present today, will increase Maxzide 37.5/25mg  3x/week, BMP/BNP in one week.   Hypertension Elevated Bps-180/88, 176/92, 188/80, 160/72, 160/98, 170/78 on Losartan 100mg  and will increase  Metoprolol to 50mg  bid, monitor Bp daily          Depression Sleep, muscle spasm, confusion all improved after IVF and sodium corrected, continue Mirtazapine for now.           Alzheimer's disease Baseline mentation now,  takes Namenda and Aricept.  MMSE 11/30 03/21/13          Insomnia Continue with Mirtazapine 7.5mg  and continue to be problematic. Clonazepam made her more confused in the past and questionable efficacy in managing her insomnia since it was resumed since she has returned to [facility] 04/20/13--change it to prn. Hesitate to add hypnotic due to  her frequently falling.  Will try to increase Mirtazapine to 15mg  nightly later.       Leukocytosis, unspecified Rocephin 1gm IM completed. It was related to acute bronchitis. Repeated wbc trending down to 10.7--persisted with 11.2 04/21/13--repeat CBC in one week-pending.           Unspecified hypothyroidism Takes Synthroid daily. TSH 2.900 04/05/13        Acute bronchitis Fully treated with Rocephin, repeated CXR 04/19/13 showed improved mild to moderate bronchitis at the mid and lower lungs. New patchy bibasilar atelectasis or pneumonitis appears new--will observe the  patient and repeat CXR in one week  04/27/13 showed patchy bibasilar atelectasis or pneumonitis appears worse in the interval--repeat CXR in one week-pending.   Fall Larey Seat x2 04/29/13-no apparent injury noted. Will change Clonazepam to prn(was dc'd and somehow resumed after the patient was transferred back to [facility])  Spasm of muscle improved insomnia and muscle spasm after serum Na was corrected, dc'd Clonazepam in the past due to AR of more confusion--but apparently the patient is still taking 1mg  nightly since she returned to [facility] 04/20/13. This may also contribute to her falling--will change Clonazepam to prn.             Family/ staff Communication: safety   Goals of care: AL is possible   Labs/tests ordered BMP/BNP

## 2013-05-04 NOTE — Assessment & Plan Note (Signed)
Sleep, muscle spasm, confusion all improved after IVF and sodium corrected, continue Mirtazapine for now.      

## 2013-05-04 NOTE — Assessment & Plan Note (Addendum)
Continue with Mirtazapine 7.5mg  and continue to be problematic. Clonazepam made her more confused in the past and questionable efficacy in managing her insomnia since it was resumed since she has returned to [facility] 04/20/13--change it to prn. Hesitate to add hypnotic due to  her frequently falling.  Will try to increase Mirtazapine to 15mg  nightly later.

## 2013-05-04 NOTE — Assessment & Plan Note (Addendum)
Resolved, serum Na 136 04/28/13, Triamterene/HCTZ 27.5/25 2x/week resumed 04/28/13--increased to 3x/week 05/04/13-BMP in one week.

## 2013-05-04 NOTE — Assessment & Plan Note (Signed)
improved insomnia and muscle spasm after serum Na was corrected, dc'd Clonazepam in the past due to AR of more confusion--but apparently the patient is still taking 1mg  nightly since she returned to [facility] 04/20/13. This may also contribute to her falling--will change Clonazepam to prn.

## 2013-05-04 NOTE — Assessment & Plan Note (Signed)
Fully treated with Rocephin, repeated CXR 04/19/13 showed improved mild to moderate bronchitis at the mid and lower lungs. New patchy bibasilar atelectasis or pneumonitis appears new--will observe the patient and repeat CXR in one week 04/27/13 showed patchy bibasilar atelectasis or pneumonitis appears worse in the interval--repeat CXR in one week-pending.

## 2013-05-31 LAB — BASIC METABOLIC PANEL
BUN: 18 mg/dL (ref 4–21)
Creatinine: 1 mg/dL (ref 0.5–1.1)
Glucose: 103 mg/dL
Sodium: 141 mmol/L (ref 137–147)

## 2013-06-06 ENCOUNTER — Other Ambulatory Visit: Payer: Self-pay | Admitting: *Deleted

## 2013-06-06 MED ORDER — CLONAZEPAM 1 MG PO TABS: ORAL_TABLET | ORAL | Status: DC

## 2013-06-13 ENCOUNTER — Non-Acute Institutional Stay (SKILLED_NURSING_FACILITY): Payer: Medicare Other | Admitting: Nurse Practitioner

## 2013-06-13 DIAGNOSIS — G309 Alzheimer's disease, unspecified: Secondary | ICD-10-CM

## 2013-06-13 DIAGNOSIS — E039 Hypothyroidism, unspecified: Secondary | ICD-10-CM

## 2013-06-13 DIAGNOSIS — I10 Essential (primary) hypertension: Secondary | ICD-10-CM

## 2013-06-13 DIAGNOSIS — F0280 Dementia in other diseases classified elsewhere without behavioral disturbance: Secondary | ICD-10-CM

## 2013-06-13 DIAGNOSIS — F329 Major depressive disorder, single episode, unspecified: Secondary | ICD-10-CM

## 2013-06-13 DIAGNOSIS — R609 Edema, unspecified: Secondary | ICD-10-CM

## 2013-06-13 NOTE — Assessment & Plan Note (Signed)
Trace in ankles and bibasilar rales auscultated still present today, takes Maxzide 37.5/25mg  3x/week, Na 141, Bun/creat 18/0.99 05/31/13

## 2013-06-13 NOTE — Assessment & Plan Note (Signed)
Not well managed, continue Mirtazapine for now along with prn Clonazepam.

## 2013-06-13 NOTE — Assessment & Plan Note (Signed)
Controlled on Losartan 100mg and  Metoprolol to 50mg bid, monitor Bp daily  

## 2013-06-13 NOTE — Progress Notes (Signed)
Patient ID: [name], female   DOB: 19-Jan-1913, 79 y.o.   MRN: [id number] Code Status: DNR  No Known Allergies  Chief Complaint  Patient presents with  . Medical Managment of Chronic Issues    dementia    HPI: Patient is a 79 y.o. female seen in the SNF Memory Care Unit at [facility] today for  Chief Complaint  Patient presents with  . Medical Managment of Chronic Issues    dementia    Problem List Items Addressed This Visit   Alzheimer's disease - Primary     Admitted to Memory Care Unit for care needs,  dc Namenda and Aricept per the patient's POA-granddaughter's request-  MMSE 11/30 03/21/13              Depression     Not well managed, continue Mirtazapine for now along with prn Clonazepam.               Edema (Chronic)     Trace in ankles and bibasilar rales auscultated still present today, takes Maxzide 37.5/25mg  3x/week, Na 141, Bun/creat 18/0.99 05/31/13      Hypertension     Controlled on Losartan 100mg  and  Metoprolol to 50mg  bid, monitor Bp daily              Unspecified hypothyroidism     Takes Synthroid daily. TSH 2.900 04/05/13               Review of Systems:  Review of Systems  Constitutional: Positive for malaise/fatigue. Negative for fever, chills, weight loss and diaphoresis.  HENT: Positive for hearing loss. Negative for ear pain, congestion, sore throat and neck pain.   Eyes: Negative for pain, discharge and redness.  Respiratory: Negative for cough, sputum production, shortness of breath and wheezing.   Cardiovascular: Positive for leg swelling. Negative for chest pain, palpitations, orthopnea, claudication and PND.  Gastrointestinal: Negative for heartburn, nausea, vomiting, abdominal pain, diarrhea, constipation, blood in stool and melena.  Genitourinary: Positive for frequency (ua r/o UTI). Negative for dysuria, urgency, hematuria and flank pain.  Musculoskeletal: Positive for falls. Negative  for myalgias, back pain and joint pain.  Skin: Negative for itching and rash.  Neurological: Negative for dizziness, tingling, tremors, sensory change, speech change, focal weakness, seizures, loss of consciousness and headaches. Weakness: generalized. improved.   Endo/Heme/Allergies: Negative for environmental allergies and polydipsia. Does not bruise/bleed easily.  Psychiatric/Behavioral: Positive for depression and memory loss. Negative for hallucinations. The patient is not nervous/anxious. Insomnia: better.      Past Medical History  Diagnosis Date  . Spasm of muscle 11/2011  . Thyroid disease   . Anemia of other chronic disease 2012  . Anxiety   . Peripheral vascular disease, unspecified 2011  . Reflux esophagitis 2011  . Vomiting alone 2011  . Benign neoplasm of colon 2011  . Type II or unspecified type diabetes mellitus without mention of complication, not stated as uncontrolled 2011  . Unspecified vitamin D deficiency 2011  . Hyperlipidemia   . Depression   . Alzheimer's disease 2011  . Unspecified hearing loss 2011  . Stricture and stenosis of esophagus 2011  . Diaphragmatic hernia without mention of obstruction or gangrene 2011  . Diverticulosis of colon (without mention of hemorrhage) 2011  . Lumbago 2011  . Dizziness and giddiness 2011  . Other malaise and fatigue 2011  . Abnormality of gait 2011  . Senile dementia, uncomplicated 2011  . Hypertension    Past  Surgical History  Procedure Laterality Date  . Tonsillectomy  1923  . Cataract extraction w/ intraocular lens  implant, bilateral  1985  . Retinal detachment surgery    . Esophageal dilation  2011  . Breast cyst aspiration     Social History:   reports that she has never smoked. She has never used smokeless tobacco. She reports that she does not drink alcohol or use illicit drugs.  No family history on file.  Medications: Patient's Medications  New Prescriptions   No medications on file  Previous  Medications   ASPIRIN 81 MG TABLET    Take 81 mg by mouth daily.   CALCIUM CARBONATE-VITAMIN D (CALCIUM 500 + D) 500-125 MG-UNIT TABS    Take 1 tablet by mouth. Take one daily   CLONAZEPAM (KLONOPIN) 1 MG TABLET    Take one tablet once daily as needed for restless legs   DONEPEZIL (ARICEPT) 5 MG TABLET    Take 5 mg by mouth at bedtime as needed.   LEVOTHYROXINE (SYNTHROID, LEVOTHROID) 25 MCG TABLET    Take 25 mcg by mouth daily before breakfast.   LOSARTAN (COZAAR) 100 MG TABLET    Take 100 mg by mouth daily.   MEMANTINE (NAMENDA) 5 MG TABLET    Take 5 mg by mouth 2 (two) times daily.   METOPROLOL (LOPRESSOR) 50 MG TABLET    Take 50 mg by mouth. Take one daily for blood pressure   MIRTAZAPINE (REMERON) 15 MG TABLET    Take 15 mg by mouth. Take 1/2 tablet daily   MULTIPLE VITAMINS-MINERALS (CERTAVITE SENIOR/ANTIOXIDANT PO)    Take 1 tablet by mouth. Take one daily   OMEPRAZOLE (PRILOSEC) 20 MG CAPSULE    Take 20 mg by mouth. Take one twice daily   TRIAMTERENE-HYDROCHLOROTHIAZIDE (MAXZIDE-25) 37.5-25 MG PER TABLET    Take 1 tablet by mouth daily.  Modified Medications   No medications on file  Discontinued Medications   No medications on file     Physical Exam: Physical Exam  Constitutional: She appears well-developed and well-nourished. No distress.  HENT:  Head: Normocephalic and atraumatic.  Nose: Nose normal.  Mouth/Throat: No oropharyngeal exudate.  Eyes: Conjunctivae and EOM are normal. Pupils are equal, round, and reactive to light. Right eye exhibits no discharge. Left eye exhibits no discharge. No scleral icterus.  Neck: Normal range of motion. Neck supple. No JVD present. No thyromegaly present.  Cardiovascular: Normal rate, regular rhythm and normal heart sounds.   No murmur heard. Pulmonary/Chest: Effort normal. No respiratory distress. She has no wheezes. She has rales (bibasilar).  Abdominal: Soft. Bowel sounds are normal. She exhibits no distension. There is no  tenderness.  Musculoskeletal: She exhibits edema (trace BLE). She exhibits no tenderness (posterior lower rib cage pain when palpated or with deep breath).  Anterior left upper chest size 3x3cm pain when palpated, but not with deep breath or truncal movement. No bruise seen.   Lymphadenopathy:    She has no cervical adenopathy.  Neurological: She is alert. She has normal reflexes. She displays normal reflexes. No cranial nerve deficit. She exhibits normal muscle tone. Coordination normal.  Skin: Skin is warm and dry. No rash noted. She is not diaphoretic. No erythema. No pallor.  Psychiatric: Her mood appears not anxious. Her affect is not angry, not blunt, not labile and not inappropriate. Her speech is delayed. Her speech is not rapid and/or pressured, not tangential and not slurred. She is slowed and withdrawn. She is not agitated, not  aggressive, not hyperactive and not combative. Thought content is not paranoid and not delusional. Cognition and memory are impaired. She expresses impulsivity and inappropriate judgment. She exhibits a depressed mood. She is communicative. She exhibits abnormal recent memory and abnormal remote memory.    Filed Vitals:   06/13/13 1715  BP: 126/86  Pulse: 76  Temp: 98.7 F (37.1 C)  TempSrc: Tympanic  Resp: 18      Labs reviewed: Basic Metabolic Panel:  Recent Labs  16/10/96 04/06/13 1729  04/21/13 04/28/13 05/31/13  NA 129* 123*  < > 134* 136* 141  K 4.2 4.0  < > 3.9 4.0 4.0  CL  --  87*  --   --   --   --   CO2  --  27  --   --   --   --   GLUCOSE  --  110*  --   --   --   --   BUN 20 19  < > 10 13 18   CREATININE 1.2* 1.03  < > 0.9 1.1 1.0  CALCIUM  --  9.9  --   --   --   --   TSH 2.90  --   --   --   --   --   < > = values in this interval not displayed. Liver Function Tests:  Recent Labs  04/05/13  AST 20  ALT 10  ALKPHOS 95   No results found for this basename: LIPASE, AMYLASE,  in the last 8760 hours No results found for this  basename: AMMONIA,  in the last 8760 hours CBC:  Recent Labs  04/05/13 04/06/13 1731 04/19/13 04/21/13 06/03/13  WBC 14.5 13.4* 10.7 11.2 9.6  HGB 13.4 13.0 12.1 12.1 12.1  HCT 39 40.1 36 35* 37  MCV  --  91.2  --   --   --   PLT  --   --  311 309 405*   Lipid Panel: No results found for this basename: CHOL, HDL, LDLCALC, TRIG, CHOLHDL, LDLDIRECT,  in the last 8760 hours Anemia Panel: No results found for this basename: FOLATE, IRON, VITAMINB12,  in the last 8760 hours  Past Procedures:   06/02/13 CXR mild pulmonary congestion increasing when correlated with the prior study. Airspace opacities infrahilar regions bilaterally thought to represent infiltrate. These findings are new when correlated with the prior study. A component of atelectatic change may be present.   Assessment/Plan Alzheimer's disease Admitted to Memory Care Unit for care needs,  dc Namenda and Aricept per the patient's POA-granddaughter's request-  MMSE 11/30 03/21/13            Unspecified hypothyroidism Takes Synthroid daily. TSH 2.900 04/05/13          Edema Trace in ankles and bibasilar rales auscultated still present today, takes Maxzide 37.5/25mg  3x/week, Na 141, Bun/creat 18/0.99 05/31/13    Hypertension Controlled on Losartan 100mg  and  Metoprolol to 50mg  bid, monitor Bp daily            Depression Not well managed, continue Mirtazapine for now along with prn Clonazepam.              Family/ Staff Communication: safety, anxiety, sleep  Goals of Care: SNF, Memory Care Unit  Labs/tests ordered: none

## 2013-06-13 NOTE — Assessment & Plan Note (Signed)
Admitted to Memory Care Unit for care needs,  dc Namenda and Aricept per the patient's POA-granddaughter's request-  MMSE 11/30 03/21/13

## 2013-06-13 NOTE — Assessment & Plan Note (Signed)
Takes Synthroid 25mcg daily. TSH 2.900 04/05/13                       

## 2013-06-14 LAB — BASIC METABOLIC PANEL
BUN: 20 mg/dL (ref 4–21)
Creatinine: 1.2 mg/dL — AB (ref 0.5–1.1)
Potassium: 4.2 mmol/L (ref 3.4–5.3)
Sodium: 140 mmol/L (ref 137–147)

## 2013-06-29 ENCOUNTER — Non-Acute Institutional Stay (SKILLED_NURSING_FACILITY): Payer: Medicare Other | Admitting: Nurse Practitioner

## 2013-06-29 ENCOUNTER — Encounter: Payer: Self-pay | Admitting: Nurse Practitioner

## 2013-06-29 DIAGNOSIS — G309 Alzheimer's disease, unspecified: Secondary | ICD-10-CM

## 2013-06-29 DIAGNOSIS — R609 Edema, unspecified: Secondary | ICD-10-CM

## 2013-06-29 DIAGNOSIS — M62838 Other muscle spasm: Secondary | ICD-10-CM

## 2013-06-29 DIAGNOSIS — E039 Hypothyroidism, unspecified: Secondary | ICD-10-CM

## 2013-06-29 DIAGNOSIS — F329 Major depressive disorder, single episode, unspecified: Secondary | ICD-10-CM

## 2013-06-29 DIAGNOSIS — I10 Essential (primary) hypertension: Secondary | ICD-10-CM

## 2013-06-29 DIAGNOSIS — F0280 Dementia in other diseases classified elsewhere without behavioral disturbance: Secondary | ICD-10-CM

## 2013-06-29 NOTE — Assessment & Plan Note (Signed)
Takes Synthroid 25mcg daily. TSH 2.900 04/05/13                       

## 2013-06-29 NOTE — Assessment & Plan Note (Signed)
Trace in ankles and bibasilar rales auscultated still present today, takes Maxzide 37.5/25mg  3x/week, Na 141, Bun/creat 18/0.99 05/31/13-20/1.17 06/14/13

## 2013-06-29 NOTE — Progress Notes (Signed)
Patient ID: [name], female   DOB: [birth year], 79 y.o.   MRN: [id number] Code Status: DNR  No Known Allergies  Chief Complaint  Patient presents with  . Medical Managment of Chronic Issues    HPI: Patient is a 79 y.o. female seen in the SNF at [facility] today for evaluation of her chronic medical conditions  Problem List Items Addressed This Visit   Alzheimer's disease     Baseline mentation now and no change since off  Namenda and Aricept 06/13/13.  MMSE 11/30 03/21/13              Depression     Sleep better--continue Mirtazapine for now.               Edema (Chronic)     Trace in ankles and bibasilar rales auscultated still present today, takes Maxzide 37.5/25mg  3x/week, Na 141, Bun/creat 18/0.99 05/31/13-20/1.17 06/14/13        Hypertension     Controlled on Losartan 100mg  and  Metoprolol to 50mg  bid, monitor Bp daily                Spasm of muscle - Primary     improved insomnia and muscle spasm after serum Na was corrected, continue to decrease Clonazepam to 0.5mg  prn.             Unspecified hypothyroidism     Takes Synthroid daily. TSH 2.900 04/05/13                 Review of Systems:  Review of Systems  Constitutional: Positive for malaise/fatigue. Negative for fever, chills, weight loss and diaphoresis.  HENT: Positive for hearing loss. Negative for ear pain, congestion, sore throat and neck pain.   Eyes: Negative for pain, discharge and redness.  Respiratory: Negative for cough, sputum production, shortness of breath and wheezing.   Cardiovascular: Positive for leg swelling. Negative for chest pain, palpitations, orthopnea, claudication and PND.  Gastrointestinal: Negative for heartburn, nausea, vomiting, abdominal pain, diarrhea, constipation, blood in stool and melena.  Genitourinary: Positive for frequency (ua r/o UTI). Negative for dysuria, urgency, hematuria and flank pain.  Musculoskeletal:  Positive for falls. Negative for myalgias, back pain and joint pain.  Skin: Negative for itching and rash.  Neurological: Negative for dizziness, tingling, tremors, sensory change, speech change, focal weakness, seizures, loss of consciousness and headaches. Weakness: generalized. improved.   Endo/Heme/Allergies: Negative for environmental allergies and polydipsia. Does not bruise/bleed easily.  Psychiatric/Behavioral: Positive for depression and memory loss. Negative for hallucinations. The patient is not nervous/anxious. Insomnia: better.      Past Medical History  Diagnosis Date  . Spasm of muscle 11/2011  . Thyroid disease   . Anemia of other chronic disease 2012  . Anxiety   . Peripheral vascular disease, unspecified 2011  . Reflux esophagitis 2011  . Vomiting alone 2011  . Benign neoplasm of colon 2011  . Type II or unspecified type diabetes mellitus without mention of complication, not stated as uncontrolled 2011  . Unspecified vitamin D deficiency 2011  . Hyperlipidemia   . Depression   . Alzheimer's disease 2011  . Unspecified hearing loss 2011  . Stricture and stenosis of esophagus 2011  . Diaphragmatic hernia without mention of obstruction or gangrene 2011  . Diverticulosis of colon (without mention of hemorrhage) 2011  . Lumbago 2011  . Dizziness and giddiness 2011  . Other malaise and fatigue 2011  . Abnormality of gait 2011  .  Senile dementia, uncomplicated 2011  . Hypertension    Past Surgical History  Procedure Laterality Date  . Tonsillectomy  1923  . Cataract extraction w/ intraocular lens  implant, bilateral  1985  . Retinal detachment surgery    . Esophageal dilation  2011  . Breast cyst aspiration     Social History:   reports that she has never smoked. She has never used smokeless tobacco. She reports that she does not drink alcohol or use illicit drugs.  History reviewed. No pertinent family history.  Medications: Patient's Medications  New  Prescriptions   No medications on file  Previous Medications   ASPIRIN 81 MG TABLET    Take 81 mg by mouth daily.   CALCIUM CARBONATE-VITAMIN D (CALCIUM 500 + D) 500-125 MG-UNIT TABS    Take 1 tablet by mouth. Take one daily   CLONAZEPAM (KLONOPIN) 1 MG TABLET    Take one tablet once daily as needed for restless legs   DONEPEZIL (ARICEPT) 5 MG TABLET    Take 5 mg by mouth at bedtime as needed.   LEVOTHYROXINE (SYNTHROID, LEVOTHROID) 25 MCG TABLET    Take 25 mcg by mouth daily before breakfast.   LOSARTAN (COZAAR) 100 MG TABLET    Take 100 mg by mouth daily.   MEMANTINE (NAMENDA) 5 MG TABLET    Take 5 mg by mouth 2 (two) times daily.   METOPROLOL (LOPRESSOR) 50 MG TABLET    Take 50 mg by mouth. Take one daily for blood pressure   MIRTAZAPINE (REMERON) 15 MG TABLET    Take 15 mg by mouth. Take 1/2 tablet daily   MULTIPLE VITAMINS-MINERALS (CERTAVITE SENIOR/ANTIOXIDANT PO)    Take 1 tablet by mouth. Take one daily   OMEPRAZOLE (PRILOSEC) 20 MG CAPSULE    Take 20 mg by mouth. Take one twice daily   TRIAMTERENE-HYDROCHLOROTHIAZIDE (MAXZIDE-25) 37.5-25 MG PER TABLET    Take 1 tablet by mouth daily.  Modified Medications   No medications on file  Discontinued Medications   No medications on file     Physical Exam: Physical Exam  Constitutional: She appears well-developed and well-nourished. No distress.  HENT:  Head: Normocephalic and atraumatic.  Nose: Nose normal.  Mouth/Throat: No oropharyngeal exudate.  Eyes: Conjunctivae and EOM are normal. Pupils are equal, round, and reactive to light. Right eye exhibits no discharge. Left eye exhibits no discharge. No scleral icterus.  Neck: Normal range of motion. Neck supple. No JVD present. No thyromegaly present.  Cardiovascular: Normal rate, regular rhythm and normal heart sounds.   No murmur heard. Pulmonary/Chest: Effort normal. No respiratory distress. She has no wheezes. She has rales (bibasilar).  Abdominal: Soft. Bowel sounds are normal.  She exhibits no distension. There is no tenderness.  Musculoskeletal: She exhibits edema (trace BLE). She exhibits no tenderness (posterior lower rib cage pain when palpated or with deep breath).  Anterior left upper chest size 3x3cm pain when palpated, but not with deep breath or truncal movement. No bruise seen.   Lymphadenopathy:    She has no cervical adenopathy.  Neurological: She is alert. She has normal reflexes. She displays normal reflexes. No cranial nerve deficit. She exhibits normal muscle tone. Coordination normal.  Skin: Skin is warm and dry. No rash noted. She is not diaphoretic. No erythema. No pallor.  Psychiatric: Her mood appears not anxious. Her affect is not angry, not blunt, not labile and not inappropriate. Her speech is delayed. Her speech is not rapid and/or pressured, not tangential and  not slurred. She is slowed and withdrawn. She is not agitated, not aggressive, not hyperactive and not combative. Thought content is not paranoid and not delusional. Cognition and memory are impaired. She expresses impulsivity and inappropriate judgment. She exhibits a depressed mood. She is communicative. She exhibits abnormal recent memory and abnormal remote memory.    Filed Vitals:   06/29/13 1225  BP: 114/78  Pulse: 70  Temp: 97.5 F (36.4 C)  TempSrc: Tympanic  Resp: 20      Labs reviewed: Basic Metabolic Panel:  Recent Labs  16/10/96 04/06/13 1729  04/28/13 05/31/13 06/14/13  NA 129* 123*  < > 136* 141 140  K 4.2 4.0  < > 4.0 4.0 4.2  CL  --  87*  --   --   --   --   CO2  --  27  --   --   --   --   GLUCOSE  --  110*  --   --   --   --   BUN 20 19  < > 13 18 20   CREATININE 1.2* 1.03  < > 1.1 1.0 1.2*  CALCIUM  --  9.9  --   --   --   --   TSH 2.90  --   --   --   --   --   < > = values in this interval not displayed. Liver Function Tests:  Recent Labs  04/05/13  AST 20  ALT 10  ALKPHOS 95   No results found for this basename: LIPASE, AMYLASE,  in the last  8760 hours No results found for this basename: AMMONIA,  in the last 8760 hours CBC:  Recent Labs  04/05/13 04/06/13 1731 04/19/13 04/21/13 06/03/13  WBC 14.5 13.4* 10.7 11.2 9.6  HGB 13.4 13.0 12.1 12.1 12.1  HCT 39 40.1 36 35* 37  MCV  --  91.2  --   --   --   PLT  --   --  311 309 405*   Lipid Panel: No results found for this basename: CHOL, HDL, LDLCALC, TRIG, CHOLHDL, LDLDIRECT,  in the last 8760 hours Anemia Panel: No results found for this basename: FOLATE, IRON, VITAMINB12,  in the last 8760 hours  Past Procedures:     Assessment/Plan Spasm of muscle improved insomnia and muscle spasm after serum Na was corrected, continue to decrease Clonazepam to 0.5mg  prn.           Alzheimer's disease Baseline mentation now and no change since off  Namenda and Aricept 06/13/13.  MMSE 11/30 03/21/13            Edema Trace in ankles and bibasilar rales auscultated still present today, takes Maxzide 37.5/25mg  3x/week, Na 141, Bun/creat 18/0.99 05/31/13-20/1.17 06/14/13      Unspecified hypothyroidism Takes Synthroid daily. TSH 2.900 04/05/13            Hypertension Controlled on Losartan 100mg  and  Metoprolol to 50mg  bid, monitor Bp daily              Depression Sleep better--continue Mirtazapine for now.               Family/ Staff Communication: observe the patient.   Goals of Care: SNF Memory Care Unit  Labs/tests ordered: none.

## 2013-06-29 NOTE — Assessment & Plan Note (Signed)
improved insomnia and muscle spasm after serum Na was corrected, continue to decrease Clonazepam to 0.5mg  prn.

## 2013-06-29 NOTE — Assessment & Plan Note (Signed)
Baseline mentation now and no change since off  Namenda and Aricept 06/13/13.  MMSE 11/30 03/21/13

## 2013-06-29 NOTE — Assessment & Plan Note (Addendum)
Sleep better--continue Mirtazapine for now.    

## 2013-06-29 NOTE — Assessment & Plan Note (Signed)
Controlled on Losartan 100mg and  Metoprolol to 50mg bid, monitor Bp daily  

## 2013-07-26 LAB — BASIC METABOLIC PANEL
Creatinine: 1.1 mg/dL (ref <=1.1)
Sodium: 138 mmol/L (ref 137–147)

## 2013-08-03 ENCOUNTER — Non-Acute Institutional Stay (SKILLED_NURSING_FACILITY): Payer: Medicare Other | Admitting: Nurse Practitioner

## 2013-08-03 DIAGNOSIS — E039 Hypothyroidism, unspecified: Secondary | ICD-10-CM

## 2013-08-03 DIAGNOSIS — F32A Depression, unspecified: Secondary | ICD-10-CM

## 2013-08-03 DIAGNOSIS — M546 Pain in thoracic spine: Secondary | ICD-10-CM

## 2013-08-03 DIAGNOSIS — M62838 Other muscle spasm: Secondary | ICD-10-CM

## 2013-08-03 DIAGNOSIS — F329 Major depressive disorder, single episode, unspecified: Secondary | ICD-10-CM

## 2013-08-03 DIAGNOSIS — K5900 Constipation, unspecified: Secondary | ICD-10-CM

## 2013-08-03 DIAGNOSIS — R269 Unspecified abnormalities of gait and mobility: Secondary | ICD-10-CM

## 2013-08-03 DIAGNOSIS — R609 Edema, unspecified: Secondary | ICD-10-CM

## 2013-08-03 DIAGNOSIS — I10 Essential (primary) hypertension: Secondary | ICD-10-CM

## 2013-08-03 NOTE — Assessment & Plan Note (Signed)
Smiles and sleeps well at night,  continue Mirtazapine for now along with Melatonin 2.5mg  nightly

## 2013-08-03 NOTE — Assessment & Plan Note (Signed)
Controlled on Losartan 100mg and  Metoprolol to 50mg bid, monitor Bp daily  

## 2013-08-03 NOTE — Assessment & Plan Note (Addendum)
Posterior right lower rib cage region-resolved, CXR, Rib series of the right 04/07/13 showed no acute fracture. Had Urgent Care 04/06/13 pm. Had X-ray R hip/L spine: unremarkable. Takes Aleve 220mg daily and Tylenol 650mg q6h4 prn--well controlled.            

## 2013-08-03 NOTE — Assessment & Plan Note (Signed)
Trace in ankles and bibasilar rales auscultated still present today, takes Maxzide 37.5/25mg  3x/week, Na 141, Bun/creat 18/0.99 05/31/13-20/1.17 06/14/13-20/1.05 07/12/13-22/1.13 07/26/13

## 2013-08-03 NOTE — Assessment & Plan Note (Signed)
Stable on Senokot S I nightly  

## 2013-08-03 NOTE — Assessment & Plan Note (Signed)
W/c for mobility °

## 2013-08-03 NOTE — Progress Notes (Signed)
Patient ID: SILVA AAMODT, female   DOB: Apr 30, 1913, 79 y.o.   MRN: [id number] Code Status: DNR  No Known Allergies  Chief Complaint  Patient presents with  . Medical Managment of Chronic Issues    HPI: Patient is a 79 y.o. female seen in the SNF at [facility] today for evaluation of her chronic medical conditions.  Problem List Items Addressed This Visit   Abnormality of gait - Primary     W/c for mobility.         Back pain, thoracic     Posterior right lower rib cage region-resolved, CXR, Rib series of the right 04/07/13 showed no acute fracture. Had Urgent Care 04/06/13 pm. Had X-ray R hip/L spine: unremarkable. Takes Aleve 220mg  daily and Tylenol 650mg  q6h4 prn--well controlled.             Depression     Smiles and sleeps well at night,  continue Mirtazapine for now along with Melatonin 2.5mg  nightly                Edema (Chronic)     Trace in ankles and bibasilar rales auscultated still present today, takes Maxzide 37.5/25mg  3x/week, Na 141, Bun/creat 18/0.99 05/31/13-20/1.17 06/14/13-20/1.05 07/12/13-22/1.13 07/26/13          Hypertension     Controlled on Losartan 100mg  and  Metoprolol to 50mg  bid, monitor Bp daily                  Spasm of muscle     muscle spasm- Clonazepam 0.5mg  prn available to her.             Unspecified constipation     Stable on Senokot S I nightly    Unspecified hypothyroidism     Takes Synthroid daily. TSH 2.900 04/05/13                   Review of Systems:  Review of Systems  Constitutional: Negative for fever, chills, weight loss, malaise/fatigue and diaphoresis.  HENT: Positive for hearing loss. Negative for ear pain, congestion, sore throat and neck pain.   Eyes: Negative for pain, discharge and redness.  Respiratory: Negative for cough, sputum production, shortness of breath and wheezing.   Cardiovascular: Positive for leg swelling. Negative for chest pain,  palpitations, orthopnea, claudication and PND.  Gastrointestinal: Negative for heartburn, nausea, vomiting, abdominal pain, diarrhea, constipation, blood in stool and melena.  Genitourinary: Positive for frequency (ua r/o UTI). Negative for dysuria, urgency, hematuria and flank pain.  Musculoskeletal: Positive for falls. Negative for myalgias, back pain and joint pain.       Gait disorder-w/c for mobility.   Skin: Negative for itching and rash.  Neurological: Negative for dizziness, tingling, tremors, sensory change, speech change, focal weakness, seizures, loss of consciousness and headaches. Weakness: generalized. improved.   Endo/Heme/Allergies: Negative for environmental allergies and polydipsia. Does not bruise/bleed easily.  Psychiatric/Behavioral: Positive for memory loss. Negative for depression and hallucinations. The patient is not nervous/anxious. Insomnia: better.      Past Medical History  Diagnosis Date  . Spasm of muscle 11/2011  . Thyroid disease   . Anemia of other chronic disease 2012  . Anxiety   . Peripheral vascular disease, unspecified 2011  . Reflux esophagitis 2011  . Vomiting alone 2011  . Benign neoplasm of colon 2011  . Type II or unspecified type diabetes mellitus without mention of complication, not stated as uncontrolled 2011  . Unspecified vitamin D deficiency  2011  . Hyperlipidemia   . Depression   . Alzheimer's disease 2011  . Unspecified hearing loss 2011  . Stricture and stenosis of esophagus 2011  . Diaphragmatic hernia without mention of obstruction or gangrene 2011  . Diverticulosis of colon (without mention of hemorrhage) 2011  . Lumbago 2011  . Dizziness and giddiness 2011  . Other malaise and fatigue 2011  . Abnormality of gait 2011  . Senile dementia, uncomplicated 2011  . Hypertension    Past Surgical History  Procedure Laterality Date  . Tonsillectomy  1923  . Cataract extraction w/ intraocular lens  implant, bilateral  1985  .  Retinal detachment surgery    . Esophageal dilation  2011  . Breast cyst aspiration     Social History:   reports that she has never smoked. She has never used smokeless tobacco. She reports that she does not drink alcohol or use illicit drugs.   Medications: Patient's Medications  New Prescriptions   No medications on file  Previous Medications   ASPIRIN 81 MG TABLET    Take 81 mg by mouth daily.   CALCIUM CARBONATE-VITAMIN D (CALCIUM 500 + D) 500-125 MG-UNIT TABS    Take 1 tablet by mouth. Take one daily   CLONAZEPAM (KLONOPIN) 1 MG TABLET    Take one tablet once daily as needed for restless legs   DONEPEZIL (ARICEPT) 5 MG TABLET    Take 5 mg by mouth at bedtime as needed.   LEVOTHYROXINE (SYNTHROID, LEVOTHROID) 25 MCG TABLET    Take 25 mcg by mouth daily before breakfast.   LOSARTAN (COZAAR) 100 MG TABLET    Take 100 mg by mouth daily.   MEMANTINE (NAMENDA) 5 MG TABLET    Take 5 mg by mouth 2 (two) times daily.   METOPROLOL (LOPRESSOR) 50 MG TABLET    Take 50 mg by mouth. Take one daily for blood pressure   MIRTAZAPINE (REMERON) 15 MG TABLET    Take 15 mg by mouth. Take 1/2 tablet daily   MULTIPLE VITAMINS-MINERALS (CERTAVITE SENIOR/ANTIOXIDANT PO)    Take 1 tablet by mouth. Take one daily   OMEPRAZOLE (PRILOSEC) 20 MG CAPSULE    Take 20 mg by mouth. Take one twice daily   TRIAMTERENE-HYDROCHLOROTHIAZIDE (MAXZIDE-25) 37.5-25 MG PER TABLET    Take 1 tablet by mouth daily.  Modified Medications   No medications on file  Discontinued Medications   No medications on file     Physical Exam: Physical Exam  Constitutional: She appears well-developed and well-nourished. No distress.  HENT:  Head: Normocephalic and atraumatic.  Nose: Nose normal.  Mouth/Throat: No oropharyngeal exudate.  Eyes: Conjunctivae and EOM are normal. Pupils are equal, round, and reactive to light. Right eye exhibits no discharge. Left eye exhibits no discharge. No scleral icterus.  Neck: Normal range of  motion. Neck supple. No JVD present. No thyromegaly present.  Cardiovascular: Normal rate, regular rhythm and normal heart sounds.   No murmur heard. Pulmonary/Chest: Effort normal. No respiratory distress. She has no wheezes. She has rales (bibasilar).  Abdominal: Soft. Bowel sounds are normal. She exhibits no distension. There is no tenderness.  Musculoskeletal: She exhibits no tenderness (posterior lower rib cage pain when palpated or with deep breath). Edema: trace BLE.  Anterior left upper chest size 3x3cm pain when palpated, but not with deep breath or truncal movement. No bruise seen.   Lymphadenopathy:    She has no cervical adenopathy.  Neurological: She is alert. She has normal reflexes.  She displays normal reflexes. No cranial nerve deficit. She exhibits normal muscle tone. Coordination normal.  Skin: Skin is warm and dry. No rash noted. She is not diaphoretic. No erythema. No pallor.  Psychiatric: Her mood appears not anxious. Her affect is not angry, not blunt, not labile and not inappropriate. Her speech is delayed. Her speech is not rapid and/or pressured, not tangential and not slurred. She is slowed and withdrawn. She is not agitated, not aggressive, not hyperactive and not combative. Thought content is not paranoid and not delusional. Cognition and memory are impaired. She expresses impulsivity and inappropriate judgment. She exhibits a depressed mood. She is communicative. She exhibits abnormal recent memory and abnormal remote memory.    There were no vitals filed for this visit.    Labs reviewed: Basic Metabolic Panel:  Recent Labs  16/10/96 04/06/13 1729  05/31/13 06/14/13 07/26/13  NA 129* 123*  < > 141 140 138  K 4.2 4.0  < > 4.0 4.2 4.1  CL  --  87*  --   --   --   --   CO2  --  27  --   --   --   --   GLUCOSE  --  110*  --   --   --   --   BUN 20 19  < > 18 20 22*  CREATININE 1.2* 1.03  < > 1.0 1.2* 1.1  CALCIUM  --  9.9  --   --   --   --   TSH 2.90  --   --    --   --   --   < > = values in this interval not displayed. Liver Function Tests:  Recent Labs  04/05/13  AST 20  ALT 10  ALKPHOS 95   CBC:  Recent Labs  04/05/13 04/06/13 1731 04/19/13 04/21/13 06/03/13  WBC 14.5 13.4* 10.7 11.2 9.6  HGB 13.4 13.0 12.1 12.1 12.1  HCT 39 40.1 36 35* 37  MCV  --  91.2  --   --   --   PLT  --   --  311 309 405*   Past Procedures: 06/02/13 CXR mild pulmonary congestion increasing when correlated with the prior study. Airspace opacities infrahilar region s bilaterally thought to represent infiltrate. These findings are new when correlated with the prior study. A component of atelectatic change may be present.     Assessment/Plan Abnormality of gait W/c for mobility.       Unspecified hypothyroidism Takes Synthroid daily. TSH 2.900 04/05/13              Hypertension Controlled on Losartan 100mg  and  Metoprolol to 50mg  bid, monitor Bp daily                Back pain, thoracic Posterior right lower rib cage region-resolved, CXR, Rib series of the right 04/07/13 showed no acute fracture. Had Urgent Care 04/06/13 pm. Had X-ray R hip/L spine: unremarkable. Takes Aleve 220mg  daily and Tylenol 650mg  q6h4 prn--well controlled.           Depression Smiles and sleeps well at night,  continue Mirtazapine for now along with Melatonin 2.5mg  nightly              Spasm of muscle muscle spasm- Clonazepam 0.5mg  prn available to her.           Unspecified constipation Stable on Senokot S I nightly  Edema Trace in ankles and bibasilar rales auscultated still  present today, takes Maxzide 37.5/25mg  3x/week, Na 141, Bun/creat 18/0.99 05/31/13-20/1.17 06/14/13-20/1.05 07/12/13-22/1.13 07/26/13          Family/ Staff Communication: observe the patient  Goals of Care: SNF, [facility]  Labs/tests ordered: none

## 2013-08-03 NOTE — Assessment & Plan Note (Signed)
Takes Synthroid 25mcg daily. TSH 2.900 04/05/13                       

## 2013-08-03 NOTE — Assessment & Plan Note (Signed)
muscle spasm- Clonazepam 0.5mg  prn available to her.

## 2013-08-23 LAB — BASIC METABOLIC PANEL
Creatinine: 1 mg/dL (ref 0.5–1.1)
Glucose: 104 mg/dL
Sodium: 140 mmol/L (ref 137–147)

## 2013-09-05 ENCOUNTER — Non-Acute Institutional Stay (SKILLED_NURSING_FACILITY): Payer: Medicare Other | Admitting: Nurse Practitioner

## 2013-09-05 ENCOUNTER — Encounter: Payer: Self-pay | Admitting: Nurse Practitioner

## 2013-09-05 DIAGNOSIS — K5900 Constipation, unspecified: Secondary | ICD-10-CM

## 2013-09-05 DIAGNOSIS — E039 Hypothyroidism, unspecified: Secondary | ICD-10-CM

## 2013-09-05 DIAGNOSIS — E871 Hypo-osmolality and hyponatremia: Secondary | ICD-10-CM

## 2013-09-05 DIAGNOSIS — M62838 Other muscle spasm: Secondary | ICD-10-CM

## 2013-09-05 DIAGNOSIS — R269 Unspecified abnormalities of gait and mobility: Secondary | ICD-10-CM

## 2013-09-05 DIAGNOSIS — G4700 Insomnia, unspecified: Secondary | ICD-10-CM

## 2013-09-05 DIAGNOSIS — R609 Edema, unspecified: Secondary | ICD-10-CM

## 2013-09-05 DIAGNOSIS — I10 Essential (primary) hypertension: Secondary | ICD-10-CM

## 2013-09-05 DIAGNOSIS — M546 Pain in thoracic spine: Secondary | ICD-10-CM

## 2013-09-05 DIAGNOSIS — F329 Major depressive disorder, single episode, unspecified: Secondary | ICD-10-CM

## 2013-09-05 NOTE — Assessment & Plan Note (Signed)
Takes Synthroid 25mcg daily. TSH 2.900 04/05/13                       

## 2013-09-05 NOTE — Assessment & Plan Note (Signed)
Trace in ankles and bibasilar rales auscultated still present today, takes Maxzide 37.5/25mg  3x/week, Na 141, Bun/creat 18/0.99 05/31/13-20/1.17 06/14/13-20/1.05 07/12/13-22/1.13 07/26/13-21/1.05 08/23/13

## 2013-09-05 NOTE — Assessment & Plan Note (Signed)
Controlled on Losartan 100mg and  Metoprolol to 50mg bid, monitor Bp daily  

## 2013-09-05 NOTE — Assessment & Plan Note (Signed)
muscle spasm- Clonazepam 0.5mg prn available to her.          

## 2013-09-05 NOTE — Assessment & Plan Note (Signed)
W/c for mobility °

## 2013-09-05 NOTE — Assessment & Plan Note (Signed)
Posterior right lower rib cage region-resolved, CXR, Rib series of the right 04/07/13 showed no acute fracture. Had Urgent Care 04/06/13 pm. Had X-ray R hip/L spine: unremarkable. Takes Aleve 220mg  daily and Tylenol 650mg  q6h4 prn--well controlled.

## 2013-09-05 NOTE — Assessment & Plan Note (Signed)
Sleep better--continue Mirtazapine for now.    

## 2013-09-05 NOTE — Assessment & Plan Note (Signed)
Resolved, last serum Na 140 08/23/13

## 2013-09-05 NOTE — Assessment & Plan Note (Signed)
Stable on Senokot S I nightly  

## 2013-09-05 NOTE — Assessment & Plan Note (Signed)
Managed with Mirtazapine 7.5mg  nightly.

## 2013-09-05 NOTE — Progress Notes (Signed)
Patient ID: [name], female   DOB: [birth year], 79 y.o.   MRN: [id number] Code Status: DNR  No Known Allergies  Chief Complaint  Patient presents with  . Medical Managment of Chronic Issues    HPI: Patient is a 79 y.o. female seen in the SNF at [facility] today for evaluation of her chronic medical conditions.  Problem List Items Addressed This Visit   Abnormality of gait - Primary     W/c for mobility.           Back pain, thoracic     Posterior right lower rib cage region-resolved, CXR, Rib series of the right 04/07/13 showed no acute fracture. Had Urgent Care 04/06/13 pm. Had X-ray R hip/L spine: unremarkable. Takes Aleve 220mg  daily and Tylenol 650mg  q6h4 prn--well controlled.               Relevant Medications      naproxen sodium (ANAPROX) 220 MG tablet   Depression     Sleep better--continue Mirtazapine for now.                 Edema (Chronic)     Trace in ankles and bibasilar rales auscultated still present today, takes Maxzide 37.5/25mg  3x/week, Na 141, Bun/creat 18/0.99 05/31/13-20/1.17 06/14/13-20/1.05 07/12/13-22/1.13 07/26/13-21/1.05 08/23/13            Hypertension     Controlled on Losartan 100mg  and  Metoprolol to 50mg  bid, monitor Bp daily                  Hyponatremia (Chronic)     Resolved, last serum Na 140 08/23/13    Insomnia     Managed with Mirtazapine 7.5mg  nightly.    Spasm of muscle     muscle spasm- Clonazepam 0.5mg  prn available to her.               Unspecified constipation     Stable on Senokot S I nightly      Unspecified hypothyroidism     Takes Synthroid daily. TSH 2.900 04/05/13                     Review of Systems:  Review of Systems  Constitutional: Negative for fever, chills, weight loss, malaise/fatigue and diaphoresis.  HENT: Positive for hearing loss. Negative for ear pain, congestion, sore throat and neck pain.   Eyes: Negative for pain,  discharge and redness.  Respiratory: Negative for cough, sputum production, shortness of breath and wheezing.   Cardiovascular: Positive for leg swelling. Negative for chest pain, palpitations, orthopnea, claudication and PND.  Gastrointestinal: Negative for heartburn, nausea, vomiting, abdominal pain, diarrhea, constipation, blood in stool and melena.  Genitourinary: Positive for frequency (ua r/o UTI). Negative for dysuria, urgency, hematuria and flank pain.  Musculoskeletal: Positive for falls. Negative for myalgias, back pain and joint pain.       Gait disorder-w/c for mobility.   Skin: Negative for itching and rash.  Neurological: Negative for dizziness, tingling, tremors, sensory change, speech change, focal weakness, seizures, loss of consciousness and headaches. Weakness: generalized. improved.   Endo/Heme/Allergies: Negative for environmental allergies and polydipsia. Does not bruise/bleed easily.  Psychiatric/Behavioral: Positive for memory loss. Negative for depression and hallucinations. The patient is not nervous/anxious. Insomnia: better.      Past Medical History  Diagnosis Date  . Spasm of muscle 11/2011  . Thyroid disease   . Anemia of other chronic disease 2012  . Anxiety   .  Peripheral vascular disease, unspecified 2011  . Reflux esophagitis 2011  . Vomiting alone 2011  . Benign neoplasm of colon 2011  . Type II or unspecified type diabetes mellitus without mention of complication, not stated as uncontrolled 2011  . Unspecified vitamin D deficiency 2011  . Hyperlipidemia   . Depression   . Alzheimer's disease 2011  . Unspecified hearing loss 2011  . Stricture and stenosis of esophagus 2011  . Diaphragmatic hernia without mention of obstruction or gangrene 2011  . Diverticulosis of colon (without mention of hemorrhage) 2011  . Lumbago 2011  . Dizziness and giddiness 2011  . Other malaise and fatigue 2011  . Abnormality of gait 2011  . Senile dementia,  uncomplicated 2011  . Hypertension    Past Surgical History  Procedure Laterality Date  . Tonsillectomy  1923  . Cataract extraction w/ intraocular lens  implant, bilateral  1985  . Retinal detachment surgery    . Esophageal dilation  2011  . Breast cyst aspiration     Social History:   reports that she has never smoked. She has never used smokeless tobacco. She reports that she does not drink alcohol or use illicit drugs.   Medications: Patient's Medications  New Prescriptions   No medications on file  Previous Medications   ASPIRIN 81 MG TABLET    Take 81 mg by mouth daily.   CALCIUM CARBONATE-VITAMIN D (CALCIUM 500 + D) 500-125 MG-UNIT TABS    Take 1 tablet by mouth. Take one daily   DONEPEZIL (ARICEPT) 5 MG TABLET    Take 5 mg by mouth at bedtime as needed.   LEVOTHYROXINE (SYNTHROID, LEVOTHROID) 25 MCG TABLET    Take 25 mcg by mouth daily before breakfast.   LOSARTAN (COZAAR) 100 MG TABLET    Take 100 mg by mouth daily.   MEMANTINE (NAMENDA) 5 MG TABLET    Take 5 mg by mouth 2 (two) times daily.   METOPROLOL (LOPRESSOR) 50 MG TABLET    Take 50 mg by mouth. Take one daily for blood pressure   MIRTAZAPINE (REMERON) 15 MG TABLET    Take 15 mg by mouth. Take 1/2 tablet daily   MULTIPLE VITAMINS-MINERALS (CERTAVITE SENIOR/ANTIOXIDANT PO)    Take 1 tablet by mouth. Take one daily   NAPROXEN SODIUM (ANAPROX) 220 MG TABLET    Take 220 mg by mouth daily.   OMEPRAZOLE (PRILOSEC) 20 MG CAPSULE    Take 20 mg by mouth. Take one twice daily   TRIAMTERENE-HYDROCHLOROTHIAZIDE (MAXZIDE-25) 37.5-25 MG PER TABLET    Take 1 tablet by mouth daily.  Modified Medications   Modified Medication Previous Medication   CLONAZEPAM (KLONOPIN) 1 MG TABLET clonazePAM (KLONOPIN) 1 MG tablet      0.5 mg daily as needed. Take one tablet once daily as needed for restless legs    Take one tablet once daily as needed for restless legs  Discontinued Medications   No medications on file     Physical  Exam: Physical Exam  Constitutional: She appears well-developed and well-nourished. No distress.  HENT:  Head: Normocephalic and atraumatic.  Nose: Nose normal.  Mouth/Throat: No oropharyngeal exudate.  Eyes: Conjunctivae and EOM are normal. Pupils are equal, round, and reactive to light. Right eye exhibits no discharge. Left eye exhibits no discharge. No scleral icterus.  Neck: Normal range of motion. Neck supple. No JVD present. No thyromegaly present.  Cardiovascular: Normal rate, regular rhythm and normal heart sounds.   No murmur heard. Pulmonary/Chest: Effort  normal. No respiratory distress. She has no wheezes. She has rales (bibasilar).  Abdominal: Soft. Bowel sounds are normal. She exhibits no distension. There is no tenderness.  Musculoskeletal: She exhibits no tenderness (posterior lower rib cage pain when palpated or with deep breath). Edema: trace BLE.  Anterior left upper chest size 3x3cm pain when palpated, but not with deep breath or truncal movement. No bruise seen.   Lymphadenopathy:    She has no cervical adenopathy.  Neurological: She is alert. She has normal reflexes. No cranial nerve deficit. She exhibits normal muscle tone. Coordination normal.  Skin: Skin is warm and dry. No rash noted. She is not diaphoretic. No erythema. No pallor.  Psychiatric: Her mood appears not anxious. Her affect is not angry, not blunt, not labile and not inappropriate. Her speech is delayed. Her speech is not rapid and/or pressured, not tangential and not slurred. She is slowed and withdrawn. She is not agitated, not aggressive, not hyperactive and not combative. Thought content is not paranoid and not delusional. Cognition and memory are impaired. She expresses impulsivity and inappropriate judgment. She exhibits a depressed mood. She is communicative. She exhibits abnormal recent memory and abnormal remote memory.    Filed Vitals:   09/05/13 1619  BP: 122/70  Pulse: 60  Temp: 97.2 F (36.2  C)  TempSrc: Tympanic  Resp: 18      Labs reviewed: Basic Metabolic Panel:  Recent Labs  16/10/96 04/06/13 1729  06/14/13 07/26/13 08/23/13  NA 129* 123*  < > 140 138 140  K 4.2 4.0  < > 4.2 4.1 4.0  CL  --  87*  --   --   --   --   CO2  --  27  --   --   --   --   GLUCOSE  --  110*  --   --   --   --   BUN 20 19  < > 20 22* 21  CREATININE 1.2* 1.03  < > 1.2* 1.1 1.0  CALCIUM  --  9.9  --   --   --   --   TSH 2.90  --   --   --   --   --   < > = values in this interval not displayed. Liver Function Tests:  Recent Labs  04/05/13  AST 20  ALT 10  ALKPHOS 95   CBC:  Recent Labs  04/05/13 04/06/13 1731 04/19/13 04/21/13 06/03/13  WBC 14.5 13.4* 10.7 11.2 9.6  HGB 13.4 13.0 12.1 12.1 12.1  HCT 39 40.1 36 35* 37  MCV  --  91.2  --   --   --   PLT  --   --  311 309 405*   Past Procedures: 06/02/13 CXR mild pulmonary congestion increasing when correlated with the prior study. Airspace opacities infrahilar region s bilaterally thought to represent infiltrate. These findings are new when correlated with the prior study. A component of atelectatic change may be present.     Assessment/Plan Abnormality of gait W/c for mobility.         Edema Trace in ankles and bibasilar rales auscultated still present today, takes Maxzide 37.5/25mg  3x/week, Na 141, Bun/creat 18/0.99 05/31/13-20/1.17 06/14/13-20/1.05 07/12/13-22/1.13 07/26/13-21/1.05 08/23/13          Depression Sleep better--continue Mirtazapine for now.               Back pain, thoracic Posterior right lower rib cage region-resolved, CXR, Rib series of the  right 04/07/13 showed no acute fracture. Had Urgent Care 04/06/13 pm. Had X-ray R hip/L spine: unremarkable. Takes Aleve 220mg  daily and Tylenol 650mg  q6h4 prn--well controlled.             Unspecified hypothyroidism Takes Synthroid daily. TSH 2.900 04/05/13                Unspecified constipation Stable on Senokot  S I nightly    Spasm of muscle muscle spasm- Clonazepam 0.5mg  prn available to her.             Hypertension Controlled on Losartan 100mg  and  Metoprolol to 50mg  bid, monitor Bp daily                Hyponatremia Resolved, last serum Na 140 08/23/13  Insomnia Managed with Mirtazapine 7.5mg  nightly.    Family/ Staff Communication: observe the patient  Goals of Care: SNF, [facility]  Labs/tests ordered: none

## 2013-09-06 LAB — BASIC METABOLIC PANEL
BUN: 25 mg/dL — AB (ref 4–21)
Creatinine: 1.1 mg/dL (ref 0.5–1.1)
Glucose: 103 mg/dL
Potassium: 3.8 mmol/L (ref 3.4–5.3)
Sodium: 140 mmol/L (ref 137–147)

## 2013-09-21 ENCOUNTER — Encounter: Payer: Self-pay | Admitting: *Deleted

## 2013-10-03 ENCOUNTER — Encounter: Payer: Self-pay | Admitting: Nurse Practitioner

## 2013-10-03 ENCOUNTER — Non-Acute Institutional Stay (SKILLED_NURSING_FACILITY): Payer: Medicare Other | Admitting: Nurse Practitioner

## 2013-10-03 DIAGNOSIS — E039 Hypothyroidism, unspecified: Secondary | ICD-10-CM

## 2013-10-03 DIAGNOSIS — F0280 Dementia in other diseases classified elsewhere without behavioral disturbance: Secondary | ICD-10-CM

## 2013-10-03 DIAGNOSIS — K5900 Constipation, unspecified: Secondary | ICD-10-CM

## 2013-10-03 DIAGNOSIS — F329 Major depressive disorder, single episode, unspecified: Secondary | ICD-10-CM

## 2013-10-03 DIAGNOSIS — M62838 Other muscle spasm: Secondary | ICD-10-CM

## 2013-10-03 DIAGNOSIS — R269 Unspecified abnormalities of gait and mobility: Secondary | ICD-10-CM

## 2013-10-03 DIAGNOSIS — R609 Edema, unspecified: Secondary | ICD-10-CM

## 2013-10-03 DIAGNOSIS — I10 Essential (primary) hypertension: Secondary | ICD-10-CM

## 2013-10-03 DIAGNOSIS — G4700 Insomnia, unspecified: Secondary | ICD-10-CM

## 2013-10-03 NOTE — Assessment & Plan Note (Signed)
Better with  Mirtazapine 15mg nightly   

## 2013-10-03 NOTE — Assessment & Plan Note (Signed)
Ambulates with walker--more confused today-walking w/o walker frequently and risk for falling. Intensive supervision needed.   

## 2013-10-03 NOTE — Assessment & Plan Note (Signed)
Trace in ankles and bibasilar rales auscultated still present today, takes Maxzide 37.5/25mg  3x/week, update CMP

## 2013-10-03 NOTE — Assessment & Plan Note (Signed)
Sleep better--continue Mirtazapine for now.    

## 2013-10-03 NOTE — Assessment & Plan Note (Signed)
Stable on Senokot S I nightly  

## 2013-10-03 NOTE — Assessment & Plan Note (Signed)
No longer a problem. Off prn Clonazepam.

## 2013-10-03 NOTE — Assessment & Plan Note (Signed)
Takes Synthroid daily. TSH 2.900 04/05/13, update TSH

## 2013-10-03 NOTE — Assessment & Plan Note (Signed)
Controlled on Losartan 100mg and  Metoprolol to 50mg bid, monitor Bp daily  

## 2013-10-03 NOTE — Assessment & Plan Note (Signed)
Baseline mentation now and no change since off  Namenda and Aricept 06/13/13.  MMSE 11/30 03/21/13. More confused today-looking for her mother's picture, didn't sleep/rest well last night, agitated when she was re-directed, walking without walker--will obtain UA and C/S--observe the patient.

## 2013-10-03 NOTE — Progress Notes (Signed)
Patient ID: [name], female   DOB: 08-Sep-1913, 79 y.o.   MRN: [id number]  Code Status: DNR  No Known Allergies  Chief Complaint  Patient presents with  . Medical Managment of Chronic Issues    increased confustion and agitation.     HPI: Patient is a 79 y.o. female seen in the SNF at [facility] today for evaluation of increased confusion, agitation, restlessness last night, and her other chronic medical conditions.  Problem List Items Addressed This Visit   Abnormality of gait     Ambulates with walker--more confused today-walking w/o walker frequently and risk for falling. Intensive supervision needed.     Alzheimer's disease     Baseline mentation now and no change since off  Namenda and Aricept 06/13/13.  MMSE 11/30 03/21/13. More confused today-looking for her mother's picture, didn't sleep/rest well last night, agitated when she was re-directed, walking without walker--will obtain UA and C/S--observe the patient.                 Depression     Sleep better--continue Mirtazapine for now.                   Edema (Chronic)     Trace in ankles and bibasilar rales auscultated still present today, takes Maxzide 37.5/25mg  3x/week, update CMP              Hypertension     Controlled on Losartan 100mg  and  Metoprolol to 50mg  bid, monitor Bp daily                    Insomnia     Better with  Mirtazapine 15mg  nightly           Spasm of muscle     No longer a problem. Off prn Clonazepam.     Unspecified constipation     Stable on Senokot S I nightly        Unspecified hypothyroidism - Primary     Takes Synthroid daily. TSH 2.900 04/05/13, update TSH                       Review of Systems:  Review of Systems  Constitutional: Negative for fever, chills, weight loss, malaise/fatigue and diaphoresis.  HENT: Positive for hearing loss. Negative for congestion, ear pain and sore throat.    Eyes: Negative for pain, discharge and redness.  Respiratory: Negative for cough, sputum production, shortness of breath and wheezing.   Cardiovascular: Positive for leg swelling. Negative for chest pain, palpitations, orthopnea, claudication and PND.  Gastrointestinal: Negative for heartburn, nausea, vomiting, abdominal pain, diarrhea, constipation, blood in stool and melena.  Genitourinary: Positive for frequency (ua r/o UTI). Negative for dysuria, urgency, hematuria and flank pain.  Musculoskeletal: Positive for falls. Negative for back pain, joint pain, myalgias and neck pain.       Gait disorder-w/c for mobility.   Skin: Negative for itching and rash.  Neurological: Negative for dizziness, tingling, tremors, sensory change, speech change, focal weakness, seizures, loss of consciousness and headaches. Weakness: generalized. improved.   Endo/Heme/Allergies: Negative for environmental allergies and polydipsia. Does not bruise/bleed easily.  Psychiatric/Behavioral: Positive for memory loss. Negative for depression and hallucinations. The patient has insomnia (better). The patient is not nervous/anxious.      Past Medical History  Diagnosis Date  . Spasm of muscle 11/2011  . Thyroid disease   . Anemia of other chronic disease 2012  .  Anxiety   . Peripheral vascular disease, unspecified 2011  . Reflux esophagitis 2011  . Vomiting alone 2011  . Benign neoplasm of colon 2011  . Type II or unspecified type diabetes mellitus without mention of complication, not stated as uncontrolled 2011  . Unspecified vitamin D deficiency 2011  . Hyperlipidemia   . Depression   . Alzheimer's disease 2011  . Unspecified hearing loss 2011  . Stricture and stenosis of esophagus 2011  . Diaphragmatic hernia without mention of obstruction or gangrene 2011  . Diverticulosis of colon (without mention of hemorrhage) 2011  . Lumbago 2011  . Dizziness and giddiness 2011  . Other malaise and fatigue 2011  .  Abnormality of gait 2011  . Senile dementia, uncomplicated 2011  . Hypertension    Past Surgical History  Procedure Laterality Date  . Tonsillectomy  1923  . Cataract extraction w/ intraocular lens  implant, bilateral  1985  . Retinal detachment surgery    . Esophageal dilation  2011  . Breast cyst aspiration     Social History:   reports that she has never smoked. She has never used smokeless tobacco. She reports that she does not drink alcohol or use illicit drugs.   Medications: Patient's Medications  New Prescriptions   No medications on file  Previous Medications   ASPIRIN 325 MG EC TABLET    Take 650 mg by mouth every 6 (six) hours as needed for pain. Take 2 tabs(650 mg) by mouth every 6 hours as needed for pain.   ASPIRIN 81 MG TABLET    Take 81 mg by mouth daily.   CALCIUM CARBONATE-VITAMIN D (CALCIUM 500 + D) 500-125 MG-UNIT TABS    Take 1 tablet by mouth. Take one daily   CARBOXYMETHYLCELLUL-GLYCERIN (OPTIVE) 0.5-0.9 % SOLN    Apply 1 drop to eye 4 (four) times daily. Instill 1 drop in both eyes four times daily. *Wait 3-5 minutes between eye drops.*   DONEPEZIL (ARICEPT) 5 MG TABLET    Take 5 mg by mouth at bedtime as needed.   LEVOTHYROXINE (SYNTHROID, LEVOTHROID) 25 MCG TABLET    Take 25 mcg by mouth daily before breakfast. *Check pulse weekly on Saturday.*   LOSARTAN (COZAAR) 100 MG TABLET    Take 100 mg by mouth daily.   MELATONIN 2.5 MG CAPS    Take 2.5 mg by mouth at bedtime.   MEMANTINE (NAMENDA) 5 MG TABLET    Take 5 mg by mouth 2 (two) times daily.   METOPROLOL (LOPRESSOR) 50 MG TABLET    Take 50 mg by mouth 2 (two) times daily. Take one daily for blood pressure   MIRTAZAPINE (REMERON) 15 MG TABLET    Take 15 mg by mouth.    MULTIPLE VITAMINS-MINERALS (CERTAVITE SENIOR/ANTIOXIDANT PO)    Take 1 tablet by mouth. Take one daily   NAPROXEN SODIUM (ANAPROX) 220 MG TABLET    Take 220 mg by mouth daily. For back pain.   OMEPRAZOLE (PRILOSEC) 20 MG CAPSULE    Take 20 mg  by mouth 2 (two) times daily. *Do Not Crush* Take on a empty stomach.   SENNOSIDES-DOCUSATE SODIUM (SENOKOT-S) 8.6-50 MG TABLET    Take 1 tablet by mouth at bedtime.   TRIAMTERENE-HYDROCHLOROTHIAZIDE (MAXZIDE-25) 37.5-25 MG PER TABLET    Take 1 tablet by mouth daily. Take 1 tablet on Mon, Wed, and Fri by mouth.  Modified Medications   No medications on file  Discontinued Medications   CLONAZEPAM (KLONOPIN) 1 MG TABLET  0.5 mg daily as needed. Take one tablet once daily as needed for restless legs     Physical Exam: Physical Exam  Constitutional: She appears well-developed and well-nourished. No distress.  HENT:  Head: Normocephalic and atraumatic.  Nose: Nose normal.  Mouth/Throat: No oropharyngeal exudate.  Eyes: Conjunctivae and EOM are normal. Pupils are equal, round, and reactive to light. Right eye exhibits no discharge. Left eye exhibits no discharge. No scleral icterus.  Neck: Normal range of motion. Neck supple. No JVD present. No thyromegaly present.  Cardiovascular: Normal rate, regular rhythm and normal heart sounds.   No murmur heard. Pulmonary/Chest: Effort normal. No respiratory distress. She has no wheezes. She has rales (bibasilar).  Abdominal: Soft. Bowel sounds are normal. She exhibits no distension. There is no tenderness.  Musculoskeletal: She exhibits no tenderness (posterior lower rib cage pain when palpated or with deep breath). Edema: trace BLE.  Anterior left upper chest size 3x3cm pain when palpated, but not with deep breath or truncal movement. No bruise seen.   Lymphadenopathy:    She has no cervical adenopathy.  Neurological: She is alert. She has normal reflexes. No cranial nerve deficit. She exhibits normal muscle tone. Coordination normal.  Skin: Skin is warm and dry. No rash noted. She is not diaphoretic. No erythema. No pallor.  Psychiatric: Her mood appears anxious. Her affect is not angry, not blunt, not labile and not inappropriate. Her speech is  delayed. Her speech is not rapid and/or pressured, not tangential and not slurred. She is agitated, slowed and withdrawn. She is not aggressive, not hyperactive and not combative. Thought content is not paranoid and not delusional. Cognition and memory are impaired. She expresses impulsivity and inappropriate judgment. She exhibits a depressed mood. She is communicative. She exhibits abnormal recent memory and abnormal remote memory.    Filed Vitals:   10/03/13 1554  BP: 116/84  Pulse: 72  Temp: 97.6 F (36.4 C)  TempSrc: Tympanic  Resp: 20      Labs reviewed: Basic Metabolic Panel:  Recent Labs  16/10/96 04/06/13 1729  07/26/13 08/23/13 09/06/13  NA 129* 123*  < > 138 140 140  K 4.2 4.0  < > 4.1 4.0 3.8  CL  --  87*  --   --   --   --   CO2  --  27  --   --   --   --   GLUCOSE  --  110*  --   --   --   --   BUN 20 19  < > 22* 21 25*  CREATININE 1.2* 1.03  < > 1.1 1.0 1.1  CALCIUM  --  9.9  --   --   --   --   TSH 2.90  --   --   --   --   --   < > = values in this interval not displayed. Liver Function Tests:  Recent Labs  04/05/13  AST 20  ALT 10  ALKPHOS 95   CBC:  Recent Labs  04/05/13 04/06/13 1731 04/19/13 04/21/13 06/03/13  WBC 14.5 13.4* 10.7 11.2 9.6  HGB 13.4 13.0 12.1 12.1 12.1  HCT 39 40.1 36 35* 37  MCV  --  91.2  --   --   --   PLT  --   --  311 309 405*   Past Procedures: 06/02/13 CXR mild pulmonary congestion increasing when correlated with the prior study. Airspace opacities infrahilar region s bilaterally thought to represent  infiltrate. These findings are new when correlated with the prior study. A component of atelectatic change may be present.     Assessment/Plan Unspecified hypothyroidism Takes Synthroid daily. TSH 2.900 04/05/13, update TSH                  Unspecified constipation Stable on Senokot S I nightly      Spasm of muscle No longer a problem. Off prn Clonazepam.   Insomnia Better with   Mirtazapine 15mg  nightly         Hypertension Controlled on Losartan 100mg  and  Metoprolol to 50mg  bid, monitor Bp daily                  Edema Trace in ankles and bibasilar rales auscultated still present today, takes Maxzide 37.5/25mg  3x/week, update CMP            Depression Sleep better--continue Mirtazapine for now.                 Alzheimer's disease Baseline mentation now and no change since off  Namenda and Aricept 06/13/13.  MMSE 11/30 03/21/13. More confused today-looking for her mother's picture, didn't sleep/rest well last night, agitated when she was re-directed, walking without walker--will obtain UA and C/S--observe the patient.               Abnormality of gait Ambulates with walker--more confused today-walking w/o walker frequently and risk for falling. Intensive supervision needed.     Family/ Staff Communication: observe the patient  Goals of Care: SNF, [facility]  Labs/tests ordered: CBC, CMP, UA C/S

## 2013-11-03 LAB — BASIC METABOLIC PANEL
BUN: 30 mg/dL — AB (ref 4–21)
Glucose: 114 mg/dL
Potassium: 4.1 mmol/L (ref 3.4–5.3)

## 2013-11-09 ENCOUNTER — Non-Acute Institutional Stay (SKILLED_NURSING_FACILITY): Payer: Medicare Other | Admitting: Nurse Practitioner

## 2013-11-09 DIAGNOSIS — K5900 Constipation, unspecified: Secondary | ICD-10-CM

## 2013-11-09 DIAGNOSIS — F329 Major depressive disorder, single episode, unspecified: Secondary | ICD-10-CM

## 2013-11-09 DIAGNOSIS — R609 Edema, unspecified: Secondary | ICD-10-CM

## 2013-11-09 DIAGNOSIS — F0280 Dementia in other diseases classified elsewhere without behavioral disturbance: Secondary | ICD-10-CM

## 2013-11-09 DIAGNOSIS — F32A Depression, unspecified: Secondary | ICD-10-CM

## 2013-11-09 DIAGNOSIS — E039 Hypothyroidism, unspecified: Secondary | ICD-10-CM

## 2013-11-09 DIAGNOSIS — I10 Essential (primary) hypertension: Secondary | ICD-10-CM

## 2013-11-09 DIAGNOSIS — G4700 Insomnia, unspecified: Secondary | ICD-10-CM

## 2013-11-09 DIAGNOSIS — E871 Hypo-osmolality and hyponatremia: Secondary | ICD-10-CM

## 2013-11-09 NOTE — Progress Notes (Signed)
Patient ID: [name], female   DOB: 1913-09-05, 79 y.o.   MRN: [id number]   Code Status: DNR  No Known Allergies  Chief Complaint  Patient presents with  . Medical Managment of Chronic Issues    HPI: Patient is a 79 y.o. female seen in the SNF at [facility] today for evaluation of  her chronic medical conditions.  Problem List Items Addressed This Visit   Alzheimer's disease - Primary     Baseline mentation now and no change since off  Namenda and Aricept 06/13/13.  MMSE 11/30 03/21/13. Observe the patient.                   Depression     Sleep better--continue Mirtazapine for now.                     Edema (Chronic)     Trace in ankles and bibasilar rales auscultated still present today, takes Maxzide 37.5/25mg  3x/week                Hypertension     Controlled on Losartan 100mg  and  Metoprolol to 50mg  bid, monitor Bp daily                      Hyponatremia (Chronic)     Resolved. Will check BMP prn.     Insomnia     Better with  Mirtazapine 15mg  nightly             Unspecified constipation     Stable on Senokot S I nightly          Unspecified hypothyroidism     Takes Synthroid daily. TSH 2.900 04/05/13                         Review of Systems:  Review of Systems  Constitutional: Negative for fever, chills, weight loss, malaise/fatigue and diaphoresis.  HENT: Positive for hearing loss. Negative for congestion, ear discharge, ear pain, nosebleeds, sore throat and tinnitus.   Eyes: Negative for blurred vision, double vision, photophobia, pain, discharge and redness.  Respiratory: Negative for cough, hemoptysis, sputum production, shortness of breath, wheezing and stridor.   Cardiovascular: Positive for leg swelling. Negative for chest pain, palpitations, orthopnea, claudication and PND.  Gastrointestinal: Negative for heartburn, nausea, vomiting, abdominal pain,  diarrhea, constipation, blood in stool and melena.  Genitourinary: Positive for frequency. Negative for dysuria, urgency, hematuria and flank pain.  Musculoskeletal: Negative for back pain, joint pain, myalgias and neck pain.  Skin: Negative for itching and rash.  Neurological: Negative for dizziness, tingling, tremors, sensory change, speech change, focal weakness, seizures, loss of consciousness, weakness and headaches.  Endo/Heme/Allergies: Negative for environmental allergies and polydipsia. Does not bruise/bleed easily.  Psychiatric/Behavioral: Positive for memory loss. Negative for depression, suicidal ideas, hallucinations and substance abuse. The patient is not nervous/anxious and does not have insomnia.      Past Medical History  Diagnosis Date  . Spasm of muscle 11/2011  . Thyroid disease   . Anemia of other chronic disease 2012  . Anxiety   . Peripheral vascular disease, unspecified 2011  . Reflux esophagitis 2011  . Vomiting alone 2011  . Benign neoplasm of colon 2011  . Type II or unspecified type diabetes mellitus without mention of complication, not stated as uncontrolled 2011  . Unspecified vitamin D deficiency 2011  . Hyperlipidemia   . Depression   .  Alzheimer's disease 2011  . Unspecified hearing loss 2011  . Stricture and stenosis of esophagus 2011  . Diaphragmatic hernia without mention of obstruction or gangrene 2011  . Diverticulosis of colon (without mention of hemorrhage) 2011  . Lumbago 2011  . Dizziness and giddiness 2011  . Other malaise and fatigue 2011  . Abnormality of gait 2011  . Senile dementia, uncomplicated 2011  . Hypertension    Past Surgical History  Procedure Laterality Date  . Tonsillectomy  1923  . Cataract extraction w/ intraocular lens  implant, bilateral  1985  . Retinal detachment surgery    . Esophageal dilation  2011  . Breast cyst aspiration     Social History:   reports that she has never smoked. She has never used  smokeless tobacco. She reports that she does not drink alcohol or use illicit drugs.   Medications: Patient's Medications  New Prescriptions   No medications on file  Previous Medications   ASPIRIN 325 MG EC TABLET    Take 650 mg by mouth every 6 (six) hours as needed for pain. Take 2 tabs(650 mg) by mouth every 6 hours as needed for pain.   ASPIRIN 81 MG TABLET    Take 81 mg by mouth daily.   CALCIUM CARBONATE-VITAMIN D (CALCIUM 500 + D) 500-125 MG-UNIT TABS    Take 1 tablet by mouth. Take one daily   CARBOXYMETHYLCELLUL-GLYCERIN (OPTIVE) 0.5-0.9 % SOLN    Apply 1 drop to eye 4 (four) times daily. Instill 1 drop in both eyes four times daily. *Wait 3-5 minutes between eye drops.*   DONEPEZIL (ARICEPT) 5 MG TABLET    Take 5 mg by mouth at bedtime as needed.   LEVOTHYROXINE (SYNTHROID, LEVOTHROID) 25 MCG TABLET    Take 25 mcg by mouth daily before breakfast. *Check pulse weekly on Saturday.*   LOSARTAN (COZAAR) 100 MG TABLET    Take 100 mg by mouth daily.   MELATONIN 2.5 MG CAPS    Take 2.5 mg by mouth at bedtime.   MEMANTINE (NAMENDA) 5 MG TABLET    Take 5 mg by mouth 2 (two) times daily.   METOPROLOL (LOPRESSOR) 50 MG TABLET    Take 50 mg by mouth 2 (two) times daily. Take one daily for blood pressure   MIRTAZAPINE (REMERON) 15 MG TABLET    Take 15 mg by mouth.    MULTIPLE VITAMINS-MINERALS (CERTAVITE SENIOR/ANTIOXIDANT PO)    Take 1 tablet by mouth. Take one daily   NAPROXEN SODIUM (ANAPROX) 220 MG TABLET    Take 220 mg by mouth daily. For back pain.   OMEPRAZOLE (PRILOSEC) 20 MG CAPSULE    Take 20 mg by mouth 2 (two) times daily. *Do Not Crush* Take on a empty stomach.   SENNOSIDES-DOCUSATE SODIUM (SENOKOT-S) 8.6-50 MG TABLET    Take 1 tablet by mouth at bedtime.   TRIAMTERENE-HYDROCHLOROTHIAZIDE (MAXZIDE-25) 37.5-25 MG PER TABLET    Take 1 tablet by mouth daily. Take 1 tablet on Mon, Wed, and Fri by mouth.  Modified Medications   No medications on file  Discontinued Medications   No  medications on file     Physical Exam: Physical Exam  Constitutional: She is oriented to person, place, and time. She appears well-developed and well-nourished. No distress.  HENT:  Head: Normocephalic and atraumatic.  Right Ear: External ear normal.  Left Ear: External ear normal.  Nose: Nose normal.  Mouth/Throat: Oropharynx is clear and moist. No oropharyngeal exudate.  Eyes: Conjunctivae and EOM  are normal. Pupils are equal, round, and reactive to light. Right eye exhibits no discharge. Left eye exhibits no discharge. No scleral icterus.  Neck: Normal range of motion. Neck supple. No JVD present. No tracheal deviation present. No thyromegaly present.  Cardiovascular: Normal rate, regular rhythm and normal heart sounds.  Exam reveals no friction rub.   No murmur heard. Pulmonary/Chest: Effort normal and breath sounds normal. No stridor. No respiratory distress. She has no wheezes. She has no rales. She exhibits no tenderness.  Abdominal: Soft. Bowel sounds are normal. She exhibits no distension. There is no tenderness. There is no rebound and no guarding.  Musculoskeletal: Normal range of motion. She exhibits edema. She exhibits no tenderness.  Trace edema in BLE. Ambulates with walker.   Lymphadenopathy:    She has no cervical adenopathy.  Neurological: She is alert and oriented to person, place, and time. She has normal reflexes. She displays normal reflexes. No cranial nerve deficit. She exhibits normal muscle tone. Coordination normal.  Skin: Skin is warm and dry. No rash noted. She is not diaphoretic. No erythema. No pallor.  Psychiatric: She has a normal mood and affect. Her behavior is normal. Thought content normal. Cognition and memory are impaired. She expresses impulsivity and inappropriate judgment. She exhibits abnormal recent memory.    Filed Vitals:   11/10/13 1959  BP: 110/80  Pulse: 70  Temp: 97.1 F (36.2 C)  TempSrc: Tympanic  Resp: 20      Labs  reviewed: Basic Metabolic Panel:  Recent Labs  40/98/11 04/06/13 1729  08/23/13 09/06/13 11/03/13  NA 129* 123*  < > 140 140 141  K 4.2 4.0  < > 4.0 3.8 4.1  CL  --  87*  --   --   --   --   CO2  --  27  --   --   --   --   GLUCOSE  --  110*  --   --   --   --   BUN 20 19  < > 21 25* 30*  CREATININE 1.2* 1.03  < > 1.0 1.1 1.0  CALCIUM  --  9.9  --   --   --   --   TSH 2.90  --   --   --   --   --   < > = values in this interval not displayed. Liver Function Tests:  Recent Labs  04/05/13  AST 20  ALT 10  ALKPHOS 95   CBC:  Recent Labs  04/05/13 04/06/13 1731 04/19/13 04/21/13 06/03/13  WBC 14.5 13.4* 10.7 11.2 9.6  HGB 13.4 13.0 12.1 12.1 12.1  HCT 39 40.1 36 35* 37  MCV  --  91.2  --   --   --   PLT  --   --  311 309 405*   Past Procedures:  06/02/13 CXR mild pulmonary congestion increasing when correlated with the prior study. Airspace opacities infrahilar region s bilaterally thought to represent infiltrate. These findings are new when correlated with the prior study. A component of atelectatic change may be present.   Assessment/Plan Alzheimer's disease Baseline mentation now and no change since off  Namenda and Aricept 06/13/13.  MMSE 11/30 03/21/13. Observe the patient.                 Depression Sleep better--continue Mirtazapine for now.                   Edema Trace in  ankles and bibasilar rales auscultated still present today, takes Maxzide 37.5/25mg  3x/week              Hypertension Controlled on Losartan 100mg  and  Metoprolol to 50mg  bid, monitor Bp daily                    Insomnia Better with  Mirtazapine 15mg  nightly           Unspecified constipation Stable on Senokot S I nightly        Unspecified hypothyroidism Takes Synthroid daily. TSH 2.900 04/05/13                    Hyponatremia Resolved. Will check BMP prn.     Family/ Staff  Communication: observe the patient  Goals of Care: SNF  Labs/tests ordered: none

## 2013-11-10 ENCOUNTER — Encounter: Payer: Self-pay | Admitting: Nurse Practitioner

## 2013-11-10 NOTE — Assessment & Plan Note (Signed)
Better with  Mirtazapine 15mg nightly   

## 2013-11-10 NOTE — Assessment & Plan Note (Signed)
Baseline mentation now and no change since off  Namenda and Aricept 06/13/13.  MMSE 11/30 03/21/13. Observe the patient.   

## 2013-11-10 NOTE — Assessment & Plan Note (Signed)
Controlled on Losartan 100mg and  Metoprolol to 50mg bid, monitor Bp daily  

## 2013-11-10 NOTE — Assessment & Plan Note (Signed)
Stable on Senokot S I nightly  

## 2013-11-10 NOTE — Assessment & Plan Note (Signed)
Resolved. Will check BMP prn.

## 2013-11-10 NOTE — Assessment & Plan Note (Signed)
Takes Synthroid 25mcg daily. TSH 2.900 04/05/13                       

## 2013-11-10 NOTE — Assessment & Plan Note (Signed)
Trace in ankles and bibasilar rales auscultated still present today, takes Maxzide 37.5/25mg 3x/week               

## 2013-11-10 NOTE — Assessment & Plan Note (Signed)
Sleep better--continue Mirtazapine for now.    

## 2013-11-30 ENCOUNTER — Encounter: Payer: Self-pay | Admitting: Nurse Practitioner

## 2013-11-30 ENCOUNTER — Non-Acute Institutional Stay (SKILLED_NURSING_FACILITY): Payer: Medicare Other | Admitting: Nurse Practitioner

## 2013-11-30 DIAGNOSIS — K5900 Constipation, unspecified: Secondary | ICD-10-CM

## 2013-11-30 DIAGNOSIS — I10 Essential (primary) hypertension: Secondary | ICD-10-CM

## 2013-11-30 DIAGNOSIS — R609 Edema, unspecified: Secondary | ICD-10-CM

## 2013-11-30 DIAGNOSIS — F0280 Dementia in other diseases classified elsewhere without behavioral disturbance: Secondary | ICD-10-CM

## 2013-11-30 DIAGNOSIS — E039 Hypothyroidism, unspecified: Secondary | ICD-10-CM

## 2013-11-30 DIAGNOSIS — E871 Hypo-osmolality and hyponatremia: Secondary | ICD-10-CM

## 2013-11-30 DIAGNOSIS — F329 Major depressive disorder, single episode, unspecified: Secondary | ICD-10-CM

## 2013-11-30 NOTE — Assessment & Plan Note (Signed)
Takes Synthroid 25mcg daily. TSH 2.900 04/05/13                       

## 2013-11-30 NOTE — Assessment & Plan Note (Signed)
Sleep better--continue Mirtazapine for now.    

## 2013-11-30 NOTE — Assessment & Plan Note (Signed)
Stable on Senokot S I nightly  

## 2013-11-30 NOTE — Assessment & Plan Note (Signed)
Trace in ankles and bibasilar rales auscultated still present today, takes Maxzide 37.5/25mg  3x/week

## 2013-11-30 NOTE — Progress Notes (Signed)
Patient ID: [name], female   DOB: 01-10-1913, 79 y.o.   MRN: [id number]   Code Status: DNR  No Known Allergies  Chief Complaint  Patient presents with  . Medical Managment of Chronic Issues    HPI: Patient is a 79 y.o. female seen in the SNF at [facility] today for evaluation of her chronic medical conditions.  Problem List Items Addressed This Visit   Hyponatremia (Chronic)     Resolved.       Edema (Chronic)     Trace in ankles and bibasilar rales auscultated still present today, takes Maxzide 37.5/25mg  3x/week                  Hypertension     Controlled on Losartan 100mg  and  Metoprolol to 50mg  bid, monitor Bp daily                        Depression     Sleep better--continue Mirtazapine for now.                       Alzheimer's disease     Baseline mentation now and no change since off  Namenda and Aricept 06/13/13.  MMSE 11/30 03/21/13. Observe the patient.                     Unspecified hypothyroidism     Takes Synthroid daily. TSH 2.900 04/05/13                        Unspecified constipation - Primary     Stable on Senokot S I nightly               Review of System:Review of Systems  Constitutional: Negative for fever, chills, weight loss, malaise/fatigue and diaphoresis.  HENT: Positive for hearing loss. Negative for congestion, ear discharge, ear pain, nosebleeds, sore throat and tinnitus.   Eyes: Negative for blurred vision, double vision, photophobia, pain, discharge and redness.  Respiratory: Negative for cough, hemoptysis, sputum production, shortness of breath, wheezing and stridor.   Cardiovascular: Positive for leg swelling. Negative for chest pain, palpitations, orthopnea, claudication and PND.  Gastrointestinal: Negative for heartburn, nausea, vomiting, abdominal pain, diarrhea, constipation, blood in stool and melena.  Genitourinary:  Positive for frequency. Negative for dysuria, urgency, hematuria and flank pain.  Musculoskeletal: Negative for back pain, joint pain, myalgias and neck pain.  Skin: Negative for itching and rash.  Neurological: Negative for dizziness, tingling, tremors, sensory change, speech change, focal weakness, seizures, loss of consciousness, weakness and headaches.  Endo/Heme/Allergies: Negative for environmental allergies and polydipsia. Does not bruise/bleed easily.  Psychiatric/Behavioral: Positive for memory loss. Negative for depression, suicidal ideas, hallucinations and substance abuse. The patient is not nervous/anxious and does not have insomnia.      Past Medical History  Diagnosis Date  . Spasm of muscle 11/2011  . Thyroid disease   . Anemia of other chronic disease 2012  . Anxiety   . Peripheral vascular disease, unspecified 2011  . Reflux esophagitis 2011  . Vomiting alone 2011  . Benign neoplasm of colon 2011  . Type II or unspecified type diabetes mellitus without mention of complication, not stated as uncontrolled 2011  . Unspecified vitamin D deficiency 2011  . Hyperlipidemia   . Depression   . Alzheimer's disease 2011  . Unspecified hearing loss 2011  . Stricture and stenosis of esophagus  2011  . Diaphragmatic hernia without mention of obstruction or gangrene 2011  . Diverticulosis of colon (without mention of hemorrhage) 2011  . Lumbago 2011  . Dizziness and giddiness 2011  . Other malaise and fatigue 2011  . Abnormality of gait 2011  . Senile dementia, uncomplicated 2011  . Hypertension    Past Surgical History  Procedure Laterality Date  . Tonsillectomy  1923  . Cataract extraction w/ intraocular lens  implant, bilateral  1985  . Retinal detachment surgery    . Esophageal dilation  2011  . Breast cyst aspiration     Social History:   reports that she has never smoked. She has never used smokeless tobacco. She reports that she does not drink alcohol or use  illicit drugs.  Medications: Patient's Medications  New Prescriptions   No medications on file  Previous Medications   ASPIRIN 325 MG EC TABLET    Take 650 mg by mouth every 6 (six) hours as needed for pain. Take 2 tabs(650 mg) by mouth every 6 hours as needed for pain.   ASPIRIN 81 MG TABLET    Take 81 mg by mouth daily.   CALCIUM CARBONATE-VITAMIN D (CALCIUM 500 + D) 500-125 MG-UNIT TABS    Take 1 tablet by mouth. Take one daily   CARBOXYMETHYLCELLUL-GLYCERIN (OPTIVE) 0.5-0.9 % SOLN    Apply 1 drop to eye 4 (four) times daily. Instill 1 drop in both eyes four times daily. *Wait 3-5 minutes between eye drops.*   DONEPEZIL (ARICEPT) 5 MG TABLET    Take 5 mg by mouth at bedtime as needed.   LEVOTHYROXINE (SYNTHROID, LEVOTHROID) 25 MCG TABLET    Take 25 mcg by mouth daily before breakfast. *Check pulse weekly on Saturday.*   LOSARTAN (COZAAR) 100 MG TABLET    Take 100 mg by mouth daily.   MELATONIN 2.5 MG CAPS    Take 2.5 mg by mouth at bedtime.   MEMANTINE (NAMENDA) 5 MG TABLET    Take 5 mg by mouth 2 (two) times daily.   METOPROLOL (LOPRESSOR) 50 MG TABLET    Take 50 mg by mouth 2 (two) times daily. Take one daily for blood pressure   MIRTAZAPINE (REMERON) 15 MG TABLET    Take 15 mg by mouth.    MULTIPLE VITAMINS-MINERALS (CERTAVITE SENIOR/ANTIOXIDANT PO)    Take 1 tablet by mouth. Take one daily   NAPROXEN SODIUM (ANAPROX) 220 MG TABLET    Take 220 mg by mouth daily. For back pain.   OMEPRAZOLE (PRILOSEC) 20 MG CAPSULE    Take 20 mg by mouth 2 (two) times daily. *Do Not Crush* Take on a empty stomach.   SENNOSIDES-DOCUSATE SODIUM (SENOKOT-S) 8.6-50 MG TABLET    Take 1 tablet by mouth at bedtime.   TRIAMTERENE-HYDROCHLOROTHIAZIDE (MAXZIDE-25) 37.5-25 MG PER TABLET    Take 1 tablet by mouth daily. Take 1 tablet on Mon, Wed, and Fri by mouth.  Modified Medications   No medications on file  Discontinued Medications   No medications on file     Physical Exam: Physical Exam    Constitutional: She is oriented to person, place, and time. She appears well-developed and well-nourished. No distress.  HENT:  Head: Normocephalic and atraumatic.  Right Ear: External ear normal.  Left Ear: External ear normal.  Nose: Nose normal.  Mouth/Throat: Oropharynx is clear and moist. No oropharyngeal exudate.  Eyes: Conjunctivae and EOM are normal. Pupils are equal, round, and reactive to light. Right eye exhibits no discharge. Left  eye exhibits no discharge. No scleral icterus.  Neck: Normal range of motion. Neck supple. No JVD present. No tracheal deviation present. No thyromegaly present.  Cardiovascular: Normal rate, regular rhythm and normal heart sounds.  Exam reveals no friction rub.   No murmur heard. Pulmonary/Chest: Effort normal and breath sounds normal. No stridor. No respiratory distress. She has no wheezes. She has no rales. She exhibits no tenderness.  Abdominal: Soft. Bowel sounds are normal. She exhibits no distension. There is no tenderness. There is no rebound and no guarding.  Musculoskeletal: Normal range of motion. She exhibits edema. She exhibits no tenderness.  Trace edema in BLE. Ambulates with walker.   Lymphadenopathy:    She has no cervical adenopathy.  Neurological: She is alert and oriented to person, place, and time. She has normal reflexes. She displays normal reflexes. No cranial nerve deficit. She exhibits normal muscle tone. Coordination normal.  Skin: Skin is warm and dry. No rash noted. She is not diaphoretic. No erythema. No pallor.  Psychiatric: She has a normal mood and affect. Her behavior is normal. Thought content normal. Cognition and memory are impaired. She expresses impulsivity and inappropriate judgment. She exhibits abnormal recent memory.    Filed Vitals:   11/30/13 1603  BP: 92/76  Pulse: 80  Temp: 97.9 F (36.6 C)  TempSrc: Tympanic  Resp: 20      Labs reviewed: Basic Metabolic Panel:  Recent Labs  16/10/96  04/06/13 1729  08/23/13 09/06/13 11/03/13  NA 129* 123*  < > 140 140 141  K 4.2 4.0  < > 4.0 3.8 4.1  CL  --  87*  --   --   --   --   CO2  --  27  --   --   --   --   GLUCOSE  --  110*  --   --   --   --   BUN 20 19  < > 21 25* 30*  CREATININE 1.2* 1.03  < > 1.0 1.1 1.0  CALCIUM  --  9.9  --   --   --   --   TSH 2.90  --   --   --   --   --   < > = values in this interval not displayed. Liver Function Tests:  Recent Labs  04/05/13  AST 20  ALT 10  ALKPHOS 95   CBC:  Recent Labs  04/05/13 04/06/13 1731 04/19/13 04/21/13 06/03/13  WBC 14.5 13.4* 10.7 11.2 9.6  HGB 13.4 13.0 12.1 12.1 12.1  HCT 39 40.1 36 35* 37  MCV  --  91.2  --   --   --   PLT  --   --  311 309 405*   Assessment/Plan Unspecified constipation Stable on Senokot S I nightly          Unspecified hypothyroidism Takes Synthroid daily. TSH 2.900 04/05/13                      Alzheimer's disease Baseline mentation now and no change since off  Namenda and Aricept 06/13/13.  MMSE 11/30 03/21/13. Observe the patient.                   Depression Sleep better--continue Mirtazapine for now.                     Hypertension Controlled on Losartan 100mg  and  Metoprolol to 50mg  bid, monitor Bp  daily                      Edema Trace in ankles and bibasilar rales auscultated still present today, takes Maxzide 37.5/25mg  3x/week                Hyponatremia Resolved.       Family/ Staff Communication: observe the patient  Goals of Care: SNF  Labs/tests ordered: none

## 2013-11-30 NOTE — Assessment & Plan Note (Signed)
Controlled on Losartan 100mg and  Metoprolol to 50mg bid, monitor Bp daily  

## 2013-11-30 NOTE — Assessment & Plan Note (Signed)
Resolved

## 2013-11-30 NOTE — Assessment & Plan Note (Signed)
Baseline mentation now and no change since off  Namenda and Aricept 06/13/13.  MMSE 11/30 03/21/13. Observe the patient.   

## 2014-01-04 ENCOUNTER — Encounter: Payer: Self-pay | Admitting: Nurse Practitioner

## 2014-01-04 ENCOUNTER — Non-Acute Institutional Stay (SKILLED_NURSING_FACILITY): Payer: Medicare Other | Admitting: Nurse Practitioner

## 2014-01-04 DIAGNOSIS — K5900 Constipation, unspecified: Secondary | ICD-10-CM

## 2014-01-04 DIAGNOSIS — E039 Hypothyroidism, unspecified: Secondary | ICD-10-CM

## 2014-01-04 DIAGNOSIS — K219 Gastro-esophageal reflux disease without esophagitis: Secondary | ICD-10-CM

## 2014-01-04 DIAGNOSIS — R609 Edema, unspecified: Secondary | ICD-10-CM

## 2014-01-04 DIAGNOSIS — I10 Essential (primary) hypertension: Secondary | ICD-10-CM

## 2014-01-04 DIAGNOSIS — F32A Depression, unspecified: Secondary | ICD-10-CM

## 2014-01-04 DIAGNOSIS — F329 Major depressive disorder, single episode, unspecified: Secondary | ICD-10-CM

## 2014-01-04 DIAGNOSIS — F3289 Other specified depressive episodes: Secondary | ICD-10-CM

## 2014-01-04 DIAGNOSIS — B3789 Other sites of candidiasis: Secondary | ICD-10-CM | POA: Insufficient documentation

## 2014-01-04 NOTE — Assessment & Plan Note (Signed)
Controlled on Losartan 100mg and  Metoprolol to 50mg bid, monitor Bp daily  

## 2014-01-04 NOTE — Progress Notes (Signed)
Patient ID: [name], female   DOB: 1913-06-04, 80 y.o.   MRN: [id number]   Code Status: DNR  No Known Allergies  Chief Complaint  Patient presents with  . Medical Managment of Chronic Issues    under the right breast rash.     HPI: Patient is a 80 y.o. female seen in the SNF at [facility] today for evaluation of rash under the right breast skin fold and chronic medical conditions.  Problem List Items Addressed This Visit   Candidiasis of breast - Primary     Rash in satellite pattern under the right breast-will cleanse the area with water, pat dry, then apply Nystatin powder bid until healed.     Depression     Sleep better--continue Mirtazapine for now.                         Edema (Chronic)     Trace in ankles and bibasilar rales auscultated still present today, takes Maxzide 37.5/25mg  3x/week. Last Bun/creat 20/1.04 11/03/13                    GERD (gastroesophageal reflux disease)     Stable, takes Omeprazole 20mg  daily.     Hypertension     Controlled on Losartan 100mg  and  Metoprolol to 50mg  bid, monitor Bp daily                          Unspecified constipation     Stable on Senokot S I nightly              Unspecified hypothyroidism     Takes Synthroid 58mcg daily. TSH 2.900 04/05/13                             Review of System:Review of Systems  Constitutional: Negative for fever, chills, weight loss, malaise/fatigue and diaphoresis.  HENT: Positive for hearing loss. Negative for congestion, ear discharge, ear pain, nosebleeds, sore throat and tinnitus.   Eyes: Negative for blurred vision, double vision, photophobia, pain, discharge and redness.  Respiratory: Negative for cough, hemoptysis, sputum production, shortness of breath, wheezing and stridor.   Cardiovascular: Positive for leg swelling. Negative for chest pain, palpitations, orthopnea, claudication and  PND.  Gastrointestinal: Negative for heartburn, nausea, vomiting, abdominal pain, diarrhea, constipation, blood in stool and melena.  Genitourinary: Positive for frequency. Negative for dysuria, urgency, hematuria and flank pain.  Musculoskeletal: Negative for back pain, joint pain, myalgias and neck pain.  Skin: Positive for rash. Negative for itching.       Under the right breast  Neurological: Negative for dizziness, tingling, tremors, sensory change, speech change, focal weakness, seizures, loss of consciousness, weakness and headaches.  Endo/Heme/Allergies: Negative for environmental allergies and polydipsia. Does not bruise/bleed easily.  Psychiatric/Behavioral: Positive for memory loss. Negative for depression, suicidal ideas, hallucinations and substance abuse. The patient is not nervous/anxious and does not have insomnia.      Past Medical History  Diagnosis Date  . Spasm of muscle 11/2011  . Thyroid disease   . Anemia of other chronic disease 2012  . Anxiety   . Peripheral vascular disease, unspecified 2011  . Reflux esophagitis 2011  . Vomiting alone 2011  . Benign neoplasm of colon 2011  . Type II or unspecified type diabetes mellitus without mention of complication, not stated as uncontrolled 2011  .  Unspecified vitamin D deficiency 2011  . Hyperlipidemia   . Depression   . Alzheimer's disease 2011  . Unspecified hearing loss 2011  . Stricture and stenosis of esophagus 2011  . Diaphragmatic hernia without mention of obstruction or gangrene 2011  . Diverticulosis of colon (without mention of hemorrhage) 2011  . Lumbago 2011  . Dizziness and giddiness 2011  . Other malaise and fatigue 2011  . Abnormality of gait 2011  . Senile dementia, uncomplicated 5956  . Hypertension    Past Surgical History  Procedure Laterality Date  . Tonsillectomy  1923  . Cataract extraction w/ intraocular lens  implant, bilateral  1985  . Retinal detachment surgery    . Esophageal  dilation  2011  . Breast cyst aspiration     Social History:   reports that she has never smoked. She has never used smokeless tobacco. She reports that she does not drink alcohol or use illicit drugs.  Medications: Patient's Medications  New Prescriptions   No medications on file  Previous Medications   ASPIRIN 325 MG EC TABLET    Take 650 mg by mouth every 6 (six) hours as needed for pain. Take 2 tabs(650 mg) by mouth every 6 hours as needed for pain.   ASPIRIN 81 MG TABLET    Take 81 mg by mouth daily.   CALCIUM CARBONATE-VITAMIN D (CALCIUM 500 + D) 500-125 MG-UNIT TABS    Take 1 tablet by mouth. Take one daily   CARBOXYMETHYLCELLUL-GLYCERIN (OPTIVE) 0.5-0.9 % SOLN    Apply 1 drop to eye 4 (four) times daily. Instill 1 drop in both eyes four times daily. *Wait 3-5 minutes between eye drops.*   DONEPEZIL (ARICEPT) 5 MG TABLET    Take 5 mg by mouth at bedtime as needed.   LEVOTHYROXINE (SYNTHROID, LEVOTHROID) 25 MCG TABLET    Take 25 mcg by mouth daily before breakfast. *Check pulse weekly on Saturday.*   LOSARTAN (COZAAR) 100 MG TABLET    Take 100 mg by mouth daily.   MELATONIN 2.5 MG CAPS    Take 2.5 mg by mouth at bedtime.   MEMANTINE (NAMENDA) 5 MG TABLET    Take 5 mg by mouth 2 (two) times daily.   METOPROLOL (LOPRESSOR) 50 MG TABLET    Take 50 mg by mouth 2 (two) times daily. Take one daily for blood pressure   MIRTAZAPINE (REMERON) 15 MG TABLET    Take 15 mg by mouth.    MULTIPLE VITAMINS-MINERALS (CERTAVITE SENIOR/ANTIOXIDANT PO)    Take 1 tablet by mouth. Take one daily   NAPROXEN SODIUM (ANAPROX) 220 MG TABLET    Take 220 mg by mouth daily. For back pain.   OMEPRAZOLE (PRILOSEC) 20 MG CAPSULE    Take 20 mg by mouth 2 (two) times daily. *Do Not Crush* Take on a empty stomach.   SENNOSIDES-DOCUSATE SODIUM (SENOKOT-S) 8.6-50 MG TABLET    Take 1 tablet by mouth at bedtime.   TRIAMTERENE-HYDROCHLOROTHIAZIDE (MAXZIDE-25) 37.5-25 MG PER TABLET    Take 1 tablet by mouth daily. Take 1  tablet on Mon, Wed, and Fri by mouth.  Modified Medications   No medications on file  Discontinued Medications   No medications on file     Physical Exam: Physical Exam  Constitutional: She is oriented to person, place, and time. She appears well-developed and well-nourished. No distress.  HENT:  Head: Normocephalic and atraumatic.  Right Ear: External ear normal.  Left Ear: External ear normal.  Nose: Nose normal.  Mouth/Throat: Oropharynx is clear and moist. No oropharyngeal exudate.  Eyes: Conjunctivae and EOM are normal. Pupils are equal, round, and reactive to light. Right eye exhibits no discharge. Left eye exhibits no discharge. No scleral icterus.  Neck: Normal range of motion. Neck supple. No JVD present. No tracheal deviation present. No thyromegaly present.  Cardiovascular: Normal rate, regular rhythm and normal heart sounds.  Exam reveals no friction rub.   No murmur heard. Pulmonary/Chest: Effort normal and breath sounds normal. No stridor. No respiratory distress. She has no wheezes. She has no rales. She exhibits no tenderness.  Abdominal: Soft. Bowel sounds are normal. She exhibits no distension. There is no tenderness. There is no rebound and no guarding.  Musculoskeletal: Normal range of motion. She exhibits edema. She exhibits no tenderness.  Trace edema in BLE. Ambulates with walker.   Lymphadenopathy:    She has no cervical adenopathy.  Neurological: She is alert and oriented to person, place, and time. She has normal reflexes. No cranial nerve deficit. She exhibits normal muscle tone. Coordination normal.  Skin: Skin is warm and dry. No rash noted. She is not diaphoretic. No erythema. No pallor.  Under the right breast.   Psychiatric: She has a normal mood and affect. Her behavior is normal. Thought content normal. Cognition and memory are impaired. She expresses impulsivity and inappropriate judgment. She exhibits abnormal recent memory.    Filed Vitals:    01/04/14 1143  BP: 126/70  Pulse: 70  Temp: 97.9 F (36.6 C)  TempSrc: Tympanic  Resp: 18      Labs reviewed: Basic Metabolic Panel:  Recent Labs  04/05/13 04/06/13 1729  08/23/13 09/06/13 11/03/13  NA 129* 123*  < > 140 140 141  K 4.2 4.0  < > 4.0 3.8 4.1  CL  --  87*  --   --   --   --   CO2  --  27  --   --   --   --   GLUCOSE  --  110*  --   --   --   --   BUN 20 19  < > 21 25* 30*  CREATININE 1.2* 1.03  < > 1.0 1.1 1.0  CALCIUM  --  9.9  --   --   --   --   TSH 2.90  --   --   --   --   --   < > = values in this interval not displayed. Liver Function Tests:  Recent Labs  04/05/13  AST 20  ALT 10  ALKPHOS 95   CBC:  Recent Labs  04/05/13 04/06/13 1731 04/19/13 04/21/13 06/03/13  WBC 14.5 13.4* 10.7 11.2 9.6  HGB 13.4 13.0 12.1 12.1 12.1  HCT 39 40.1 36 35* 37  MCV  --  91.2  --   --   --   PLT  --   --  311 309 405*   Assessment/Plan Candidiasis of breast Rash in satellite pattern under the right breast-will cleanse the area with water, pat dry, then apply Nystatin powder bid until healed.   Unspecified hypothyroidism Takes Synthroid 20mcg daily. TSH 2.900 04/05/13                        Unspecified constipation Stable on Senokot S I nightly            Hypertension Controlled on Losartan 100mg  and  Metoprolol to 50mg  bid, monitor Bp daily  Depression Sleep better--continue Mirtazapine for now.                       GERD (gastroesophageal reflux disease) Stable, takes Omeprazole 20mg  daily.   Edema Trace in ankles and bibasilar rales auscultated still present today, takes Maxzide 37.5/25mg  3x/week. Last Bun/creat 20/1.04 11/03/13                    Family/ Staff Communication: observe the patient  Goals of Care: SNF  Labs/tests ordered: none

## 2014-01-04 NOTE — Assessment & Plan Note (Signed)
Sleep better--continue Mirtazapine for now.    

## 2014-01-04 NOTE — Assessment & Plan Note (Signed)
Takes Synthroid 20mcg daily. TSH 2.900 04/05/13

## 2014-01-04 NOTE — Assessment & Plan Note (Signed)
Stable on Senokot S I nightly  

## 2014-01-04 NOTE — Assessment & Plan Note (Signed)
Rash in satellite pattern under the right breast-will cleanse the area with water, pat dry, then apply Nystatin powder bid until healed.

## 2014-01-04 NOTE — Assessment & Plan Note (Signed)
Trace in ankles and bibasilar rales auscultated still present today, takes Maxzide 37.5/25mg  3x/week. Last Bun/creat 20/1.04 11/03/13

## 2014-01-04 NOTE — Assessment & Plan Note (Signed)
Stable, takes Omeprazole 20mg daily.  

## 2014-02-08 ENCOUNTER — Encounter: Payer: Self-pay | Admitting: Nurse Practitioner

## 2014-02-08 ENCOUNTER — Non-Acute Institutional Stay (SKILLED_NURSING_FACILITY): Payer: Medicare Other | Admitting: Nurse Practitioner

## 2014-02-08 DIAGNOSIS — D72829 Elevated white blood cell count, unspecified: Secondary | ICD-10-CM

## 2014-02-08 DIAGNOSIS — G309 Alzheimer's disease, unspecified: Secondary | ICD-10-CM

## 2014-02-08 DIAGNOSIS — F3289 Other specified depressive episodes: Secondary | ICD-10-CM

## 2014-02-08 DIAGNOSIS — F32A Depression, unspecified: Secondary | ICD-10-CM

## 2014-02-08 DIAGNOSIS — F0280 Dementia in other diseases classified elsewhere without behavioral disturbance: Secondary | ICD-10-CM

## 2014-02-08 DIAGNOSIS — F329 Major depressive disorder, single episode, unspecified: Secondary | ICD-10-CM

## 2014-02-08 DIAGNOSIS — I10 Essential (primary) hypertension: Secondary | ICD-10-CM

## 2014-02-08 DIAGNOSIS — R269 Unspecified abnormalities of gait and mobility: Secondary | ICD-10-CM

## 2014-02-08 DIAGNOSIS — K219 Gastro-esophageal reflux disease without esophagitis: Secondary | ICD-10-CM

## 2014-02-08 DIAGNOSIS — K5900 Constipation, unspecified: Secondary | ICD-10-CM

## 2014-02-08 DIAGNOSIS — R609 Edema, unspecified: Secondary | ICD-10-CM

## 2014-02-08 DIAGNOSIS — E039 Hypothyroidism, unspecified: Secondary | ICD-10-CM

## 2014-02-08 DIAGNOSIS — G4700 Insomnia, unspecified: Secondary | ICD-10-CM

## 2014-02-08 NOTE — Assessment & Plan Note (Signed)
Better with  Mirtazapine 15mg  nightly

## 2014-02-08 NOTE — Assessment & Plan Note (Signed)
Controlled on Losartan 100mg  and  Metoprolol to 50mg  bid, monitor Bp daily

## 2014-02-08 NOTE — Assessment & Plan Note (Signed)
Repeat CBC

## 2014-02-08 NOTE — Assessment & Plan Note (Signed)
Stable on Senokot S I nightly  

## 2014-02-08 NOTE — Assessment & Plan Note (Signed)
Sleep better--continue Mirtazapine for now.

## 2014-02-08 NOTE — Assessment & Plan Note (Signed)
Trace in ankles and bibasilar rales auscultated still present today, takes Maxzide 37.5/25mg  3x/week. Last Bun/creat 20/1.04 11/03/13. Update CMP

## 2014-02-08 NOTE — Assessment & Plan Note (Signed)
Ambulates with walker--more confused today-walking w/o walker frequently and risk for falling. Intensive supervision needed.

## 2014-02-08 NOTE — Assessment & Plan Note (Signed)
Takes Synthroid 25mcg daily. TSH 2.900 04/05/13                       

## 2014-02-08 NOTE — Progress Notes (Signed)
Patient ID: [name], female   DOB: 1913-04-20, 80 y.o.   MRN: [id number]   Code Status: DNR  No Known Allergies  Chief Complaint  Patient presents with  . Medical Managment of Chronic Issues    HPI: Patient is a 80 y.o. female seen in the SNF at [facility] today for evaluation of  chronic medical conditions.  Problem List Items Addressed This Visit   Unspecified hypothyroidism - Primary     Takes Synthroid 46mcg daily. TSH 2.900 04/05/13      Unspecified constipation     Stable on Senokot S I nightly    RESOLVED: Leukocytosis, unspecified     Repeat CBC    Insomnia     Better with  Mirtazapine 15mg  nightly      Hypertension     Controlled on Losartan 100mg  and  Metoprolol to 50mg  bid, monitor Bp daily      GERD (gastroesophageal reflux disease)     Stable, takes Omeprazole 20mg  daily.      Edema (Chronic)     Trace in ankles and bibasilar rales auscultated still present today, takes Maxzide 37.5/25mg  3x/week. Last Bun/creat 20/1.04 11/03/13. Update CMP    Depression     Sleep better--continue Mirtazapine for now.       Alzheimer's disease     Baseline mentation now and no change since off  Namenda and Aricept 06/13/13.  MMSE 11/30 03/21/13. Observe the patient.      Abnormality of gait     Ambulates with walker--more confused today-walking w/o walker frequently and risk for falling. Intensive supervision needed.         Review of System:Review of Systems  Constitutional: Negative for fever, chills, weight loss, malaise/fatigue and diaphoresis.  HENT: Positive for hearing loss. Negative for congestion, ear discharge, ear pain, nosebleeds, sore throat and tinnitus.   Eyes: Negative for blurred vision, double vision, photophobia, pain, discharge and redness.  Respiratory: Negative for cough, hemoptysis, sputum production, shortness of breath, wheezing and stridor.   Cardiovascular: Positive for leg swelling. Negative for chest pain, palpitations,  orthopnea, claudication and PND.  Gastrointestinal: Negative for heartburn, nausea, vomiting, abdominal pain, diarrhea, constipation, blood in stool and melena.  Genitourinary: Positive for frequency. Negative for dysuria, urgency, hematuria and flank pain.  Musculoskeletal: Negative for back pain, joint pain, myalgias and neck pain.  Skin: Positive for rash. Negative for itching.       Under the right breast  Neurological: Negative for dizziness, tingling, tremors, sensory change, speech change, focal weakness, seizures, loss of consciousness, weakness and headaches.  Endo/Heme/Allergies: Negative for environmental allergies and polydipsia. Does not bruise/bleed easily.  Psychiatric/Behavioral: Positive for memory loss. Negative for depression, suicidal ideas, hallucinations and substance abuse. The patient is not nervous/anxious and does not have insomnia.      Past Medical History  Diagnosis Date  . Spasm of muscle 11/2011  . Thyroid disease   . Anemia of other chronic disease 2012  . Anxiety   . Peripheral vascular disease, unspecified 2011  . Reflux esophagitis 2011  . Vomiting alone 2011  . Benign neoplasm of colon 2011  . Type II or unspecified type diabetes mellitus without mention of complication, not stated as uncontrolled 2011  . Unspecified vitamin D deficiency 2011  . Hyperlipidemia   . Depression   . Alzheimer's disease 2011  . Unspecified hearing loss 2011  . Stricture and stenosis of esophagus 2011  . Diaphragmatic hernia without mention of obstruction or gangrene 2011  .  Diverticulosis of colon (without mention of hemorrhage) 2011  . Lumbago 2011  . Dizziness and giddiness 2011  . Other malaise and fatigue 2011  . Abnormality of gait 2011  . Senile dementia, uncomplicated 8588  . Hypertension    Past Surgical History  Procedure Laterality Date  . Tonsillectomy  1923  . Cataract extraction w/ intraocular lens  implant, bilateral  1985  . Retinal detachment  surgery    . Esophageal dilation  2011  . Breast cyst aspiration     Social History:   reports that she has never smoked. She has never used smokeless tobacco. She reports that she does not drink alcohol or use illicit drugs.  Medications: Patient's Medications  New Prescriptions   No medications on file  Previous Medications   ASPIRIN 325 MG EC TABLET    Take 650 mg by mouth every 6 (six) hours as needed for pain. Take 2 tabs(650 mg) by mouth every 6 hours as needed for pain.   ASPIRIN 81 MG TABLET    Take 81 mg by mouth daily.   CALCIUM CARBONATE-VITAMIN D (CALCIUM 500 + D) 500-125 MG-UNIT TABS    Take 1 tablet by mouth. Take one daily   CARBOXYMETHYLCELLUL-GLYCERIN (OPTIVE) 0.5-0.9 % SOLN    Apply 1 drop to eye 4 (four) times daily. Instill 1 drop in both eyes four times daily. *Wait 3-5 minutes between eye drops.*   DONEPEZIL (ARICEPT) 5 MG TABLET    Take 5 mg by mouth at bedtime as needed.   LEVOTHYROXINE (SYNTHROID, LEVOTHROID) 25 MCG TABLET    Take 25 mcg by mouth daily before breakfast. *Check pulse weekly on Saturday.*   LOSARTAN (COZAAR) 100 MG TABLET    Take 100 mg by mouth daily.   MELATONIN 2.5 MG CAPS    Take 2.5 mg by mouth at bedtime.   MEMANTINE (NAMENDA) 5 MG TABLET    Take 5 mg by mouth 2 (two) times daily.   METOPROLOL (LOPRESSOR) 50 MG TABLET    Take 50 mg by mouth 2 (two) times daily. Take one daily for blood pressure   MIRTAZAPINE (REMERON) 15 MG TABLET    Take 15 mg by mouth.    MULTIPLE VITAMINS-MINERALS (CERTAVITE SENIOR/ANTIOXIDANT PO)    Take 1 tablet by mouth. Take one daily   NAPROXEN SODIUM (ANAPROX) 220 MG TABLET    Take 220 mg by mouth daily. For back pain.   OMEPRAZOLE (PRILOSEC) 20 MG CAPSULE    Take 20 mg by mouth 2 (two) times daily. *Do Not Crush* Take on a empty stomach.   SENNOSIDES-DOCUSATE SODIUM (SENOKOT-S) 8.6-50 MG TABLET    Take 1 tablet by mouth at bedtime.   TRIAMTERENE-HYDROCHLOROTHIAZIDE (MAXZIDE-25) 37.5-25 MG PER TABLET    Take 1 tablet  by mouth daily. Take 1 tablet on Mon, Wed, and Fri by mouth.  Modified Medications   No medications on file  Discontinued Medications   No medications on file     Physical Exam: Physical Exam  Constitutional: She is oriented to person, place, and time. She appears well-developed and well-nourished. No distress.  HENT:  Head: Normocephalic and atraumatic.  Right Ear: External ear normal.  Left Ear: External ear normal.  Nose: Nose normal.  Mouth/Throat: Oropharynx is clear and moist. No oropharyngeal exudate.  Eyes: Conjunctivae and EOM are normal. Pupils are equal, round, and reactive to light. Right eye exhibits no discharge. Left eye exhibits no discharge. No scleral icterus.  Neck: Normal range of motion. Neck supple.  No JVD present. No tracheal deviation present. No thyromegaly present.  Cardiovascular: Normal rate, regular rhythm and normal heart sounds.  Exam reveals no friction rub.   No murmur heard. Pulmonary/Chest: Effort normal and breath sounds normal. No stridor. No respiratory distress. She has no wheezes. She has no rales. She exhibits no tenderness.  Abdominal: Soft. Bowel sounds are normal. She exhibits no distension. There is no tenderness. There is no rebound and no guarding.  Musculoskeletal: Normal range of motion. She exhibits edema. She exhibits no tenderness.  Trace edema in BLE. Ambulates with walker.   Lymphadenopathy:    She has no cervical adenopathy.  Neurological: She is alert and oriented to person, place, and time. She has normal reflexes. No cranial nerve deficit. She exhibits normal muscle tone. Coordination normal.  Skin: Skin is warm and dry. No rash noted. She is not diaphoretic. No erythema. No pallor.  Under the right breast.   Psychiatric: She has a normal mood and affect. Her behavior is normal. Thought content normal. Cognition and memory are impaired. She expresses impulsivity and inappropriate judgment. She exhibits abnormal recent memory.     Filed Vitals:   02/08/14 1523  BP: 120/70  Pulse: 70  Temp: 97.2 F (36.2 C)  TempSrc: Tympanic  Resp: 18      Labs reviewed: Basic Metabolic Panel:  Recent Labs  04/05/13 04/06/13 1729  08/23/13 09/06/13 11/03/13  NA 129* 123*  < > 140 140 141  K 4.2 4.0  < > 4.0 3.8 4.1  CL  --  87*  --   --   --   --   CO2  --  27  --   --   --   --   GLUCOSE  --  110*  --   --   --   --   BUN 20 19  < > 21 25* 30*  CREATININE 1.2* 1.03  < > 1.0 1.1 1.0  CALCIUM  --  9.9  --   --   --   --   TSH 2.90  --   --   --   --   --   < > = values in this interval not displayed. Liver Function Tests:  Recent Labs  04/05/13  AST 20  ALT 10  ALKPHOS 95   CBC:  Recent Labs  04/05/13 04/06/13 1731 04/19/13 04/21/13 06/03/13  WBC 14.5 13.4* 10.7 11.2 9.6  HGB 13.4 13.0 12.1 12.1 12.1  HCT 39 40.1 36 35* 37  MCV  --  91.2  --   --   --   PLT  --   --  311 309 405*   Assessment/Plan Unspecified hypothyroidism Takes Synthroid 39mcg daily. TSH 2.900 04/05/13    Unspecified constipation Stable on Senokot S I nightly  Insomnia Better with  Mirtazapine 15mg  nightly    Hypertension Controlled on Losartan 100mg  and  Metoprolol to 50mg  bid, monitor Bp daily    GERD (gastroesophageal reflux disease) Stable, takes Omeprazole 20mg  daily.    Edema Trace in ankles and bibasilar rales auscultated still present today, takes Maxzide 37.5/25mg  3x/week. Last Bun/creat 20/1.04 11/03/13. Update CMP  Depression Sleep better--continue Mirtazapine for now.     Alzheimer's disease Baseline mentation now and no change since off  Namenda and Aricept 06/13/13.  MMSE 11/30 03/21/13. Observe the patient.    Abnormality of gait Ambulates with walker--more confused today-walking w/o walker frequently and risk for falling. Intensive supervision needed.    Leukocytosis,  unspecified Repeat CBC    Family/ Staff Communication: observe the patient  Goals of Care: SNF  Labs/tests ordered:  CBC, CMP, TSH

## 2014-02-08 NOTE — Assessment & Plan Note (Signed)
Baseline mentation now and no change since off  Namenda and Aricept 06/13/13.  MMSE 11/30 03/21/13. Observe the patient.

## 2014-02-08 NOTE — Assessment & Plan Note (Signed)
Stable, takes Omeprazole 20mg daily.  

## 2014-02-10 LAB — BASIC METABOLIC PANEL
BUN: 22 mg/dL — AB (ref 4–21)
CREATININE: 1.2 mg/dL — AB (ref 0.5–1.1)
Glucose: 102 mg/dL
Potassium: 3.8 mmol/L (ref 3.4–5.3)
Sodium: 141 mmol/L (ref 137–147)

## 2014-02-10 LAB — CBC AND DIFFERENTIAL
HEMATOCRIT: 36 % (ref 36–46)
HEMOGLOBIN: 12.3 g/dL (ref 12.0–16.0)
Platelets: 292 10*3/uL (ref 150–399)
WBC: 7.6 10*3/mL

## 2014-02-10 LAB — TSH: TSH: 3.74 u[IU]/mL (ref 0.41–5.90)

## 2014-03-08 ENCOUNTER — Non-Acute Institutional Stay (SKILLED_NURSING_FACILITY): Payer: Medicare Other | Admitting: Nurse Practitioner

## 2014-03-08 ENCOUNTER — Encounter: Payer: Self-pay | Admitting: Nurse Practitioner

## 2014-03-08 DIAGNOSIS — R609 Edema, unspecified: Secondary | ICD-10-CM

## 2014-03-08 DIAGNOSIS — I10 Essential (primary) hypertension: Secondary | ICD-10-CM

## 2014-03-08 DIAGNOSIS — F0280 Dementia in other diseases classified elsewhere without behavioral disturbance: Secondary | ICD-10-CM

## 2014-03-08 DIAGNOSIS — F32A Depression, unspecified: Secondary | ICD-10-CM

## 2014-03-08 DIAGNOSIS — F3289 Other specified depressive episodes: Secondary | ICD-10-CM

## 2014-03-08 DIAGNOSIS — K5900 Constipation, unspecified: Secondary | ICD-10-CM

## 2014-03-08 DIAGNOSIS — K219 Gastro-esophageal reflux disease without esophagitis: Secondary | ICD-10-CM

## 2014-03-08 DIAGNOSIS — F329 Major depressive disorder, single episode, unspecified: Secondary | ICD-10-CM

## 2014-03-08 DIAGNOSIS — G309 Alzheimer's disease, unspecified: Secondary | ICD-10-CM

## 2014-03-08 DIAGNOSIS — E039 Hypothyroidism, unspecified: Secondary | ICD-10-CM

## 2014-03-08 NOTE — Assessment & Plan Note (Signed)
Stable, takes Omeprazole 20mg daily.  

## 2014-03-08 NOTE — Assessment & Plan Note (Signed)
Stable on Senokot S I nightly  

## 2014-03-08 NOTE — Progress Notes (Signed)
Patient ID: [name], female   DOB: 1913/10/20, 80 y.o.   MRN: [id number]   Code Status: DNR  No Known Allergies  Chief Complaint  Patient presents with  . Medical Managment of Chronic Issues    HPI: Patient is a 80 y.o. female seen in the SNF at [facility] today for evaluation of  chronic medical conditions.  Problem List Items Addressed This Visit   Edema (Chronic)     Trace in ankles and bibasilar rales auscultated still present today, takes Maxzide 37.5/25mg  3x/week. Last Bun/creat 20/1.04 11/03/13 and 22/1.16 02/10/14    Hypertension     Controlled on Losartan 100mg  and  Metoprolol to 50mg  bid, monitor Bp daily      Depression     Sleep better--continue Mirtazapine for now    Alzheimer's disease     Baseline mentation now and no change since off  Namenda and Aricept 06/13/13.  MMSE 11/30 03/21/13. Observe the patient.      Unspecified hypothyroidism - Primary     Takes Synthroid 87mcg daily. TSH 2.900 04/05/13. 3.744 02/10/14      Unspecified constipation     Stable on Senokot S I nightly     GERD (gastroesophageal reflux disease)     Stable, takes Omeprazole 20mg  daily.         Review of System:Review of Systems  Constitutional: Negative for fever, chills, weight loss, malaise/fatigue and diaphoresis.  HENT: Positive for hearing loss. Negative for congestion, ear discharge, ear pain, nosebleeds, sore throat and tinnitus.   Eyes: Negative for blurred vision, double vision, photophobia, pain, discharge and redness.  Respiratory: Negative for cough, hemoptysis, sputum production, shortness of breath, wheezing and stridor.   Cardiovascular: Positive for leg swelling. Negative for chest pain, palpitations, orthopnea, claudication and PND.       Trace in ankles  Gastrointestinal: Negative for heartburn, nausea, vomiting, abdominal pain, diarrhea, constipation, blood in stool and melena.  Genitourinary: Positive for frequency. Negative for dysuria, urgency,  hematuria and flank pain.  Musculoskeletal: Negative for back pain, joint pain, myalgias and neck pain.  Skin: Negative for itching and rash.  Neurological: Negative for dizziness, tingling, tremors, sensory change, speech change, focal weakness, seizures, loss of consciousness, weakness and headaches.  Endo/Heme/Allergies: Negative for environmental allergies and polydipsia. Does not bruise/bleed easily.  Psychiatric/Behavioral: Positive for memory loss. Negative for depression, suicidal ideas, hallucinations and substance abuse. The patient is not nervous/anxious and does not have insomnia.      Past Medical History  Diagnosis Date  . Spasm of muscle 11/2011  . Thyroid disease   . Anemia of other chronic disease 2012  . Anxiety   . Peripheral vascular disease, unspecified 2011  . Reflux esophagitis 2011  . Vomiting alone 2011  . Benign neoplasm of colon 2011  . Type II or unspecified type diabetes mellitus without mention of complication, not stated as uncontrolled 2011  . Unspecified vitamin D deficiency 2011  . Hyperlipidemia   . Depression   . Alzheimer's disease 2011  . Unspecified hearing loss 2011  . Stricture and stenosis of esophagus 2011  . Diaphragmatic hernia without mention of obstruction or gangrene 2011  . Diverticulosis of colon (without mention of hemorrhage) 2011  . Lumbago 2011  . Dizziness and giddiness 2011  . Other malaise and fatigue 2011  . Abnormality of gait 2011  . Senile dementia, uncomplicated 5732  . Hypertension    Past Surgical History  Procedure Laterality Date  . Tonsillectomy  1923  . Cataract extraction w/ intraocular lens  implant, bilateral  1985  . Retinal detachment surgery    . Esophageal dilation  2011  . Breast cyst aspiration     Social History:   reports that she has never smoked. She has never used smokeless tobacco. She reports that she does not drink alcohol or use illicit drugs.  Medications: Patient's Medications  New  Prescriptions   No medications on file  Previous Medications   ASPIRIN 325 MG EC TABLET    Take 650 mg by mouth every 6 (six) hours as needed for pain. Take 2 tabs(650 mg) by mouth every 6 hours as needed for pain.   ASPIRIN 81 MG TABLET    Take 81 mg by mouth daily.   CALCIUM CARBONATE-VITAMIN D (CALCIUM 500 + D) 500-125 MG-UNIT TABS    Take 1 tablet by mouth. Take one daily   CARBOXYMETHYLCELLUL-GLYCERIN (OPTIVE) 0.5-0.9 % SOLN    Apply 1 drop to eye 4 (four) times daily. Instill 1 drop in both eyes four times daily. *Wait 3-5 minutes between eye drops.*   DONEPEZIL (ARICEPT) 5 MG TABLET    Take 5 mg by mouth at bedtime as needed.   LEVOTHYROXINE (SYNTHROID, LEVOTHROID) 25 MCG TABLET    Take 25 mcg by mouth daily before breakfast. *Check pulse weekly on Saturday.*   LOSARTAN (COZAAR) 100 MG TABLET    Take 100 mg by mouth daily.   MELATONIN 2.5 MG CAPS    Take 2.5 mg by mouth at bedtime.   MEMANTINE (NAMENDA) 5 MG TABLET    Take 5 mg by mouth 2 (two) times daily.   METOPROLOL (LOPRESSOR) 50 MG TABLET    Take 50 mg by mouth 2 (two) times daily. Take one daily for blood pressure   MIRTAZAPINE (REMERON) 15 MG TABLET    Take 15 mg by mouth.    MULTIPLE VITAMINS-MINERALS (CERTAVITE SENIOR/ANTIOXIDANT PO)    Take 1 tablet by mouth. Take one daily   NAPROXEN SODIUM (ANAPROX) 220 MG TABLET    Take 220 mg by mouth daily. For back pain.   OMEPRAZOLE (PRILOSEC) 20 MG CAPSULE    Take 20 mg by mouth 2 (two) times daily. *Do Not Crush* Take on a empty stomach.   SENNOSIDES-DOCUSATE SODIUM (SENOKOT-S) 8.6-50 MG TABLET    Take 1 tablet by mouth at bedtime.   TRIAMTERENE-HYDROCHLOROTHIAZIDE (MAXZIDE-25) 37.5-25 MG PER TABLET    Take 1 tablet by mouth daily. Take 1 tablet on Mon, Wed, and Fri by mouth.  Modified Medications   No medications on file  Discontinued Medications   No medications on file     Physical Exam: Physical Exam  Constitutional: She is oriented to person, place, and time. She appears  well-developed and well-nourished. No distress.  HENT:  Head: Normocephalic and atraumatic.  Right Ear: External ear normal.  Left Ear: External ear normal.  Nose: Nose normal.  Mouth/Throat: Oropharynx is clear and moist. No oropharyngeal exudate.  Eyes: Conjunctivae and EOM are normal. Pupils are equal, round, and reactive to light. Right eye exhibits no discharge. Left eye exhibits no discharge. No scleral icterus.  Neck: Normal range of motion. Neck supple. No JVD present. No tracheal deviation present. No thyromegaly present.  Cardiovascular: Normal rate, regular rhythm and normal heart sounds.  Exam reveals no friction rub.   No murmur heard. Pulmonary/Chest: Effort normal and breath sounds normal. No stridor. No respiratory distress. She has no wheezes. She has no rales. She exhibits no  tenderness.  Abdominal: Soft. Bowel sounds are normal. She exhibits no distension. There is no tenderness. There is no rebound and no guarding.  Musculoskeletal: Normal range of motion. She exhibits edema. She exhibits no tenderness.  Trace edema in BLE. Ambulates with walker.   Lymphadenopathy:    She has no cervical adenopathy.  Neurological: She is alert and oriented to person, place, and time. She has normal reflexes. No cranial nerve deficit. She exhibits normal muscle tone. Coordination normal.  Skin: Skin is warm and dry. No rash noted. She is not diaphoretic. No erythema. No pallor.  Psychiatric: She has a normal mood and affect. Her behavior is normal. Thought content normal. Cognition and memory are impaired. She expresses impulsivity and inappropriate judgment. She exhibits abnormal recent memory.    Filed Vitals:   03/08/14 1437  BP: 130/70  Pulse: 76  Temp: 97.2 F (36.2 C)  TempSrc: Tympanic  Resp: 16      Labs reviewed: Basic Metabolic Panel:  Recent Labs  04/05/13 04/06/13 1729  09/06/13 11/03/13 02/10/14  NA 129* 123*  < > 140 141 141  K 4.2 4.0  < > 3.8 4.1 3.8  CL   --  87*  --   --   --   --   CO2  --  27  --   --   --   --   GLUCOSE  --  110*  --   --   --   --   BUN 20 19  < > 25* 30* 22*  CREATININE 1.2* 1.03  < > 1.1 1.0 1.2*  CALCIUM  --  9.9  --   --   --   --   TSH 2.90  --   --   --   --  3.74  < > = values in this interval not displayed. Liver Function Tests:  Recent Labs  04/05/13  AST 20  ALT 10  ALKPHOS 95   CBC:  Recent Labs  04/05/13 04/06/13 1731  04/21/13 06/03/13 02/10/14  WBC 14.5 13.4*  < > 11.2 9.6 7.6  HGB 13.4 13.0  < > 12.1 12.1 12.3  HCT 39 40.1  < > 35* 37 36  MCV  --  91.2  --   --   --   --   PLT  --   --   < > 309 405* 292  < > = values in this interval not displayed. Assessment/Plan Unspecified hypothyroidism Takes Synthroid 43mcg daily. TSH 2.900 04/05/13. 3.744 02/10/14    Edema Trace in ankles and bibasilar rales auscultated still present today, takes Maxzide 37.5/25mg  3x/week. Last Bun/creat 20/1.04 11/03/13 and 22/1.16 02/10/14  Hypertension Controlled on Losartan 100mg  and  Metoprolol to 50mg  bid, monitor Bp daily    Alzheimer's disease Baseline mentation now and no change since off  Namenda and Aricept 06/13/13.  MMSE 11/30 03/21/13. Observe the patient.    Unspecified constipation Stable on Senokot S I nightly   GERD (gastroesophageal reflux disease) Stable, takes Omeprazole 20mg  daily.    Depression Sleep better--continue Mirtazapine for now    Family/ Staff Communication: observe the patient  Goals of Care: SNF  Labs/tests ordered: none

## 2014-03-08 NOTE — Assessment & Plan Note (Signed)
Takes Synthroid 25mcg daily. TSH 2.900 04/05/13. 3.744 02/10/14   

## 2014-03-08 NOTE — Assessment & Plan Note (Signed)
Sleep better--continue Mirtazapine for now

## 2014-03-08 NOTE — Assessment & Plan Note (Signed)
Controlled on Losartan 100mg and  Metoprolol to 50mg bid, monitor Bp daily  

## 2014-03-08 NOTE — Assessment & Plan Note (Signed)
Trace in ankles and bibasilar rales auscultated still present today, takes Maxzide 37.5/25mg  3x/week. Last Bun/creat 20/1.04 11/03/13 and 22/1.16 02/10/14

## 2014-03-08 NOTE — Assessment & Plan Note (Signed)
Baseline mentation now and no change since off  Namenda and Aricept 06/13/13.  MMSE 11/30 03/21/13. Observe the patient.

## 2014-04-05 ENCOUNTER — Non-Acute Institutional Stay (SKILLED_NURSING_FACILITY): Payer: Medicare Other | Admitting: Nurse Practitioner

## 2014-04-05 ENCOUNTER — Encounter: Payer: Self-pay | Admitting: Nurse Practitioner

## 2014-04-05 DIAGNOSIS — E039 Hypothyroidism, unspecified: Secondary | ICD-10-CM

## 2014-04-05 DIAGNOSIS — M546 Pain in thoracic spine: Secondary | ICD-10-CM

## 2014-04-05 DIAGNOSIS — K5900 Constipation, unspecified: Secondary | ICD-10-CM

## 2014-04-05 DIAGNOSIS — I10 Essential (primary) hypertension: Secondary | ICD-10-CM

## 2014-04-05 DIAGNOSIS — F329 Major depressive disorder, single episode, unspecified: Secondary | ICD-10-CM

## 2014-04-05 DIAGNOSIS — F0280 Dementia in other diseases classified elsewhere without behavioral disturbance: Secondary | ICD-10-CM

## 2014-04-05 DIAGNOSIS — K219 Gastro-esophageal reflux disease without esophagitis: Secondary | ICD-10-CM

## 2014-04-05 DIAGNOSIS — F32A Depression, unspecified: Secondary | ICD-10-CM

## 2014-04-05 DIAGNOSIS — R609 Edema, unspecified: Secondary | ICD-10-CM

## 2014-04-05 DIAGNOSIS — F3289 Other specified depressive episodes: Secondary | ICD-10-CM

## 2014-04-05 DIAGNOSIS — G309 Alzheimer's disease, unspecified: Secondary | ICD-10-CM

## 2014-04-05 NOTE — Assessment & Plan Note (Signed)
Controlled on Losartan 100mg  and  Metoprolol to 50mg  bid, monitor Bp daily

## 2014-04-05 NOTE — Assessment & Plan Note (Signed)
Stable on Senokot S I nightly  

## 2014-04-05 NOTE — Assessment & Plan Note (Signed)
Baseline mentation now and no change since off  Namenda and Aricept 06/13/13.  MMSE 11/30 03/21/13. Observe the patient.   

## 2014-04-05 NOTE — Assessment & Plan Note (Signed)
Sleep better--continue Mirtazapine 7.5mg nightly. Weight stabilized 

## 2014-04-05 NOTE — Progress Notes (Signed)
Patient ID: [name], female   DOB: May 27, 1913, 80 y.o.   MRN: [id number]   Code Status: DNR  No Known Allergies  Chief Complaint  Patient presents with  . Medical Management of Chronic Issues    HPI: Patient is a 80 y.o. female seen in the SNF at [facility] today for evaluation of  chronic medical conditions.  Problem List Items Addressed This Visit   Alzheimer's disease     Baseline mentation now and no change since off  Namenda and Aricept 06/13/13.  MMSE 11/30 03/21/13. Observe the patient.      Relevant Medications      mirtazapine (REMERON) 7.5 MG tablet   Back pain, thoracic     Pain is managed with Naproxen 220mg  daily.     Depression     Sleep better--continue Mirtazapine 7.5mg  nightly. Weight stabilized.      Relevant Medications      mirtazapine (REMERON) 7.5 MG tablet   Edema (Chronic)     race in ankles and bibasilar rales auscultated still present today, takes Maxzide 37.5/25mg  3x/week. Last Bun/creat 20/1.04 11/03/13 and 22/1.16 02/10/14     GERD (gastroesophageal reflux disease) - Primary     Stable, takes Omeprazole 20mg  daily.        Hypertension     Controlled on Losartan 100mg  and  Metoprolol to 50mg  bid, monitor Bp daily     Unspecified constipation     Stable on Senokot S I nightly      Unspecified hypothyroidism     Takes Synthroid 47mcg daily. TSH 2.900 04/05/13. 3.744 02/10/14        Review of System:Review of Systems  Constitutional: Negative for fever, chills, weight loss, malaise/fatigue and diaphoresis.  HENT: Positive for hearing loss. Negative for congestion, ear discharge, ear pain, nosebleeds, sore throat and tinnitus.   Eyes: Negative for blurred vision, double vision, photophobia, pain, discharge and redness.  Respiratory: Negative for cough, hemoptysis, sputum production, shortness of breath, wheezing and stridor.   Cardiovascular: Positive for leg swelling. Negative for chest pain, palpitations, orthopnea,  claudication and PND.       Trace in ankles  Gastrointestinal: Negative for heartburn, nausea, vomiting, abdominal pain, diarrhea, constipation, blood in stool and melena.  Genitourinary: Positive for frequency. Negative for dysuria, urgency, hematuria and flank pain.  Musculoskeletal: Negative for back pain, joint pain, myalgias and neck pain.  Skin: Negative for itching and rash.  Neurological: Negative for dizziness, tingling, tremors, sensory change, speech change, focal weakness, seizures, loss of consciousness, weakness and headaches.  Endo/Heme/Allergies: Negative for environmental allergies and polydipsia. Does not bruise/bleed easily.  Psychiatric/Behavioral: Positive for memory loss. Negative for depression, suicidal ideas, hallucinations and substance abuse. The patient is not nervous/anxious and does not have insomnia.      Past Medical History  Diagnosis Date  . Spasm of muscle 11/2011  . Thyroid disease   . Anemia of other chronic disease 2012  . Anxiety   . Peripheral vascular disease, unspecified 2011  . Reflux esophagitis 2011  . Vomiting alone 2011  . Benign neoplasm of colon 2011  . Type II or unspecified type diabetes mellitus without mention of complication, not stated as uncontrolled 2011  . Unspecified vitamin D deficiency 2011  . Hyperlipidemia   . Depression   . Alzheimer's disease 2011  . Unspecified hearing loss 2011  . Stricture and stenosis of esophagus 2011  . Diaphragmatic hernia without mention of obstruction or gangrene 2011  . Diverticulosis of  colon (without mention of hemorrhage) 2011  . Lumbago 2011  . Dizziness and giddiness 2011  . Other malaise and fatigue 2011  . Abnormality of gait 2011  . Senile dementia, uncomplicated 4098  . Hypertension    Past Surgical History  Procedure Laterality Date  . Tonsillectomy  1923  . Cataract extraction w/ intraocular lens  implant, bilateral  1985  . Retinal detachment surgery    . Esophageal  dilation  2011  . Breast cyst aspiration     Social History:   reports that she has never smoked. She has never used smokeless tobacco. She reports that she does not drink alcohol or use illicit drugs.  Medications: Patient's Medications  New Prescriptions   No medications on file  Previous Medications   ASPIRIN 81 MG TABLET    Take 81 mg by mouth daily.   CALCIUM CARBONATE-VITAMIN D (CALCIUM 500 + D) 500-125 MG-UNIT TABS    Take 1 tablet by mouth. Take one daily   CARBOXYMETHYLCELLUL-GLYCERIN (OPTIVE) 0.5-0.9 % SOLN    Apply 1 drop to eye 4 (four) times daily. Instill 1 drop in both eyes four times daily. *Wait 3-5 minutes between eye drops.*   LEVOTHYROXINE (SYNTHROID, LEVOTHROID) 25 MCG TABLET    Take 25 mcg by mouth daily before breakfast. *Check pulse weekly on Saturday.*   LOSARTAN (COZAAR) 100 MG TABLET    Take 100 mg by mouth daily.   METOPROLOL (LOPRESSOR) 50 MG TABLET    Take 50 mg by mouth 2 (two) times daily. Take one daily for blood pressure   MIRTAZAPINE (REMERON) 7.5 MG TABLET    Take 7.5 mg by mouth at bedtime.   MULTIPLE VITAMINS-MINERALS (CERTAVITE SENIOR/ANTIOXIDANT PO)    Take 1 tablet by mouth. Take one daily   NAPROXEN SODIUM (ANAPROX) 220 MG TABLET    Take 220 mg by mouth daily. For back pain.   OMEPRAZOLE (PRILOSEC) 20 MG CAPSULE    Take 20 mg by mouth daily. *Do Not Crush* Take on a empty stomach.   SENNOSIDES-DOCUSATE SODIUM (SENOKOT-S) 8.6-50 MG TABLET    Take 1 tablet by mouth at bedtime.   TRIAMTERENE-HYDROCHLOROTHIAZIDE (MAXZIDE-25) 37.5-25 MG PER TABLET    Take 1 tablet by mouth daily. Take 1 tablet on Mon, Wed, and Fri by mouth.  Modified Medications   No medications on file  Discontinued Medications   ASPIRIN 325 MG EC TABLET    Take 650 mg by mouth every 6 (six) hours as needed for pain. Take 2 tabs(650 mg) by mouth every 6 hours as needed for pain.   DONEPEZIL (ARICEPT) 5 MG TABLET    Take 5 mg by mouth at bedtime as needed.   MELATONIN 2.5 MG CAPS     Take 2.5 mg by mouth at bedtime.   MEMANTINE (NAMENDA) 5 MG TABLET    Take 5 mg by mouth 2 (two) times daily.   MIRTAZAPINE (REMERON) 15 MG TABLET    Take 15 mg by mouth.      Physical Exam: Physical Exam  Constitutional: She is oriented to person, place, and time. She appears well-developed and well-nourished. No distress.  HENT:  Head: Normocephalic and atraumatic.  Right Ear: External ear normal.  Left Ear: External ear normal.  Nose: Nose normal.  Mouth/Throat: Oropharynx is clear and moist. No oropharyngeal exudate.  Eyes: Conjunctivae and EOM are normal. Pupils are equal, round, and reactive to light. Right eye exhibits no discharge. Left eye exhibits no discharge. No scleral icterus.  Neck:  Normal range of motion. Neck supple. No JVD present. No tracheal deviation present. No thyromegaly present.  Cardiovascular: Normal rate, regular rhythm and normal heart sounds.  Exam reveals no friction rub.   No murmur heard. Pulmonary/Chest: Effort normal and breath sounds normal. No stridor. No respiratory distress. She has no wheezes. She has no rales. She exhibits no tenderness.  Abdominal: Soft. Bowel sounds are normal. She exhibits no distension. There is no tenderness. There is no rebound and no guarding.  Musculoskeletal: Normal range of motion. She exhibits edema. She exhibits no tenderness.  Trace edema in BLE. Ambulates with walker.   Lymphadenopathy:    She has no cervical adenopathy.  Neurological: She is alert and oriented to person, place, and time. She has normal reflexes. No cranial nerve deficit. She exhibits normal muscle tone. Coordination normal.  Skin: Skin is warm and dry. No rash noted. She is not diaphoretic. No erythema. No pallor.  Psychiatric: She has a normal mood and affect. Her behavior is normal. Thought content normal. Cognition and memory are impaired. She expresses impulsivity and inappropriate judgment. She exhibits abnormal recent memory.    Filed Vitals:    04/05/14 1719  BP: 120/70  Pulse: 68  Temp: 97.9 F (36.6 C)  TempSrc: Tympanic  Resp: 20      Labs reviewed: Basic Metabolic Panel:  Recent Labs  04/06/13 1729  09/06/13 11/03/13 02/10/14  NA 123*  < > 140 141 141  K 4.0  < > 3.8 4.1 3.8  CL 87*  --   --   --   --   CO2 27  --   --   --   --   GLUCOSE 110*  --   --   --   --   BUN 19  < > 25* 30* 22*  CREATININE 1.03  < > 1.1 1.0 1.2*  CALCIUM 9.9  --   --   --   --   TSH  --   --   --   --  3.74  < > = values in this interval not displayed.  CBC:  Recent Labs  04/06/13 1731  04/21/13 06/03/13 02/10/14  WBC 13.4*  < > 11.2 9.6 7.6  HGB 13.0  < > 12.1 12.1 12.3  HCT 40.1  < > 35* 37 36  MCV 91.2  --   --   --   --   PLT  --   < > 309 405* 292  < > = values in this interval not displayed. Assessment/Plan GERD (gastroesophageal reflux disease) Stable, takes Omeprazole 20mg  daily.      Edema race in ankles and bibasilar rales auscultated still present today, takes Maxzide 37.5/25mg  3x/week. Last Bun/creat 20/1.04 11/03/13 and 22/1.16 02/10/14   Unspecified hypothyroidism Takes Synthroid 67mcg daily. TSH 2.900 04/05/13. 3.744 02/10/14   Hypertension Controlled on Losartan 100mg  and  Metoprolol to 50mg  bid, monitor Bp daily   Unspecified constipation Stable on Senokot S I nightly    Back pain, thoracic Pain is managed with Naproxen 220mg  daily.   Depression Sleep better--continue Mirtazapine 7.5mg  nightly. Weight stabilized.    Alzheimer's disease Baseline mentation now and no change since off  Namenda and Aricept 06/13/13.  MMSE 11/30 03/21/13. Observe the patient.      Family/ Staff Communication: observe the patient  Goals of Care: SNF  Labs/tests ordered: none

## 2014-04-05 NOTE — Assessment & Plan Note (Signed)
Takes Synthroid 25mcg daily. TSH 2.900 04/05/13. 3.744 02/10/14   

## 2014-04-05 NOTE — Assessment & Plan Note (Signed)
Stable, takes Omeprazole 20mg daily.  

## 2014-04-05 NOTE — Assessment & Plan Note (Signed)
Pain is managed with Naproxen 220mg daily.   

## 2014-04-05 NOTE — Assessment & Plan Note (Signed)
race in ankles and bibasilar rales auscultated still present today, takes Maxzide 37.5/25mg  3x/week. Last Bun/creat 20/1.04 11/03/13 and 22/1.16 02/10/14

## 2014-05-03 ENCOUNTER — Non-Acute Institutional Stay (SKILLED_NURSING_FACILITY): Payer: Medicare Other | Admitting: Nurse Practitioner

## 2014-05-03 ENCOUNTER — Encounter: Payer: Self-pay | Admitting: Nurse Practitioner

## 2014-05-03 DIAGNOSIS — I10 Essential (primary) hypertension: Secondary | ICD-10-CM

## 2014-05-03 DIAGNOSIS — E039 Hypothyroidism, unspecified: Secondary | ICD-10-CM

## 2014-05-03 DIAGNOSIS — F329 Major depressive disorder, single episode, unspecified: Secondary | ICD-10-CM

## 2014-05-03 DIAGNOSIS — R609 Edema, unspecified: Secondary | ICD-10-CM

## 2014-05-03 DIAGNOSIS — F32A Depression, unspecified: Secondary | ICD-10-CM

## 2014-05-03 DIAGNOSIS — K219 Gastro-esophageal reflux disease without esophagitis: Secondary | ICD-10-CM

## 2014-05-03 DIAGNOSIS — K5900 Constipation, unspecified: Secondary | ICD-10-CM

## 2014-05-03 DIAGNOSIS — F3289 Other specified depressive episodes: Secondary | ICD-10-CM

## 2014-05-03 NOTE — Assessment & Plan Note (Signed)
Sleep better--continue Mirtazapine 7.5mg  nightly. Weight stabilized

## 2014-05-03 NOTE — Assessment & Plan Note (Signed)
Takes Synthroid 25mcg daily. TSH 2.900 04/05/13. 3.744 02/10/14   

## 2014-05-03 NOTE — Assessment & Plan Note (Signed)
Controlled on Losartan 100mg and  Metoprolol 50mg bid, monitor Bp daily  

## 2014-05-03 NOTE — Assessment & Plan Note (Signed)
Stable, takes Omeprazole 20mg daily.  

## 2014-05-03 NOTE — Assessment & Plan Note (Signed)
Trace in ankles, takes Maxzide 37.5/25mg  3x/week. Update BMP

## 2014-05-03 NOTE — Progress Notes (Signed)
Patient ID: [name], female   DOB: [birth year], 80 y.o.   MRN: [id number]   Code Status: DNR  No Known Allergies  Chief Complaint  Patient presents with  . Medical Management of Chronic Issues    HPI: Patient is a 80 y.o. female seen in the SNF at [facility] today for evaluation of  chronic medical conditions.  Problem List Items Addressed This Visit   Depression     Sleep better--continue Mirtazapine 7.5mg  nightly. Weight stabilized    Edema (Chronic)     Trace in ankles, takes Maxzide 37.5/25mg  3x/week. Update BMP     GERD (gastroesophageal reflux disease) - Primary     Stable, takes Omeprazole 20mg  daily.       Hypertension     Controlled on Losartan 100mg  and  Metoprolol 50mg  bid, monitor Bp daily      Unspecified constipation     Stable on Senokot S I nightly       Unspecified hypothyroidism     Takes Synthroid 3mcg daily. TSH 2.900 04/05/13. 3.744 02/10/14         Review of System:Review of Systems  Constitutional: Negative for fever, chills, weight loss, malaise/fatigue and diaphoresis.  HENT: Positive for hearing loss. Negative for congestion, ear discharge, ear pain, nosebleeds, sore throat and tinnitus.   Eyes: Negative for blurred vision, double vision, photophobia, pain, discharge and redness.  Respiratory: Negative for cough, hemoptysis, sputum production, shortness of breath, wheezing and stridor.   Cardiovascular: Positive for leg swelling. Negative for chest pain, palpitations, orthopnea, claudication and PND.       Trace in ankles  Gastrointestinal: Negative for heartburn, nausea, vomiting, abdominal pain, diarrhea, constipation, blood in stool and melena.  Genitourinary: Positive for frequency. Negative for dysuria, urgency, hematuria and flank pain.  Musculoskeletal: Negative for back pain, joint pain, myalgias and neck pain.  Skin: Negative for itching and rash.  Neurological: Negative for dizziness, tingling, tremors, sensory  change, speech change, focal weakness, seizures, loss of consciousness, weakness and headaches.  Endo/Heme/Allergies: Negative for environmental allergies and polydipsia. Does not bruise/bleed easily.  Psychiatric/Behavioral: Positive for memory loss. Negative for depression, suicidal ideas, hallucinations and substance abuse. The patient is not nervous/anxious and does not have insomnia.      Past Medical History  Diagnosis Date  . Spasm of muscle 11/2011  . Thyroid disease   . Anemia of other chronic disease 2012  . Anxiety   . Peripheral vascular disease, unspecified 2011  . Reflux esophagitis 2011  . Vomiting alone 2011  . Benign neoplasm of colon 2011  . Type II or unspecified type diabetes mellitus without mention of complication, not stated as uncontrolled 2011  . Unspecified vitamin D deficiency 2011  . Hyperlipidemia   . Depression   . Alzheimer's disease 2011  . Unspecified hearing loss 2011  . Stricture and stenosis of esophagus 2011  . Diaphragmatic hernia without mention of obstruction or gangrene 2011  . Diverticulosis of colon (without mention of hemorrhage) 2011  . Lumbago 2011  . Dizziness and giddiness 2011  . Other malaise and fatigue 2011  . Abnormality of gait 2011  . Senile dementia, uncomplicated 0626  . Hypertension    Past Surgical History  Procedure Laterality Date  . Tonsillectomy  1923  . Cataract extraction w/ intraocular lens  implant, bilateral  1985  . Retinal detachment surgery    . Esophageal dilation  2011  . Breast cyst aspiration     Social History:  reports that she has never smoked. She has never used smokeless tobacco. She reports that she does not drink alcohol or use illicit drugs.  Medications: Patient's Medications  New Prescriptions   No medications on file  Previous Medications   ASPIRIN 81 MG TABLET    Take 81 mg by mouth daily.   CALCIUM CARBONATE-VITAMIN D (CALCIUM 500 + D) 500-125 MG-UNIT TABS    Take 1 tablet by  mouth. Take one daily   CARBOXYMETHYLCELLUL-GLYCERIN (OPTIVE) 0.5-0.9 % SOLN    Apply 1 drop to eye 4 (four) times daily. Instill 1 drop in both eyes four times daily. *Wait 3-5 minutes between eye drops.*   LEVOTHYROXINE (SYNTHROID, LEVOTHROID) 25 MCG TABLET    Take 25 mcg by mouth daily before breakfast. *Check pulse weekly on Saturday.*   LOSARTAN (COZAAR) 100 MG TABLET    Take 100 mg by mouth daily.   METOPROLOL (LOPRESSOR) 50 MG TABLET    Take 50 mg by mouth 2 (two) times daily. Take one daily for blood pressure   MIRTAZAPINE (REMERON) 7.5 MG TABLET    Take 7.5 mg by mouth at bedtime.   MULTIPLE VITAMINS-MINERALS (CERTAVITE SENIOR/ANTIOXIDANT PO)    Take 1 tablet by mouth. Take one daily   NAPROXEN SODIUM (ANAPROX) 220 MG TABLET    Take 220 mg by mouth daily. For back pain.   OMEPRAZOLE (PRILOSEC) 20 MG CAPSULE    Take 20 mg by mouth daily. *Do Not Crush* Take on a empty stomach.   SENNOSIDES-DOCUSATE SODIUM (SENOKOT-S) 8.6-50 MG TABLET    Take 1 tablet by mouth at bedtime.   TRIAMTERENE-HYDROCHLOROTHIAZIDE (MAXZIDE-25) 37.5-25 MG PER TABLET    Take 1 tablet by mouth daily. Take 1 tablet on Mon, Wed, and Fri by mouth.  Modified Medications   No medications on file  Discontinued Medications   No medications on file     Physical Exam: Physical Exam  Constitutional: She is oriented to person, place, and time. She appears well-developed and well-nourished. No distress.  HENT:  Head: Normocephalic and atraumatic.  Right Ear: External ear normal.  Left Ear: External ear normal.  Nose: Nose normal.  Mouth/Throat: Oropharynx is clear and moist. No oropharyngeal exudate.  Eyes: Conjunctivae and EOM are normal. Pupils are equal, round, and reactive to light. Right eye exhibits no discharge. Left eye exhibits no discharge. No scleral icterus.  Neck: Normal range of motion. Neck supple. No JVD present. No tracheal deviation present. No thyromegaly present.  Cardiovascular: Normal rate, regular  rhythm and normal heart sounds.  Exam reveals no friction rub.   No murmur heard. Pulmonary/Chest: Effort normal and breath sounds normal. No stridor. No respiratory distress. She has no wheezes. She has no rales. She exhibits no tenderness.  Abdominal: Soft. Bowel sounds are normal. She exhibits no distension. There is no tenderness. There is no rebound and no guarding.  Musculoskeletal: Normal range of motion. She exhibits edema. She exhibits no tenderness.  Trace edema in BLE. Ambulates with walker.   Lymphadenopathy:    She has no cervical adenopathy.  Neurological: She is alert and oriented to person, place, and time. She has normal reflexes. No cranial nerve deficit. She exhibits normal muscle tone. Coordination normal.  Skin: Skin is warm and dry. No rash noted. She is not diaphoretic. No erythema. No pallor.  Psychiatric: She has a normal mood and affect. Her behavior is normal. Thought content normal. Cognition and memory are impaired. She expresses impulsivity and inappropriate judgment. She exhibits abnormal recent  memory.    Filed Vitals:   05/03/14 1233  BP: 118/74  Pulse: 72  Temp: 97.4 F (36.3 C)  TempSrc: Tympanic  Resp: 18      Labs reviewed: Basic Metabolic Panel:  Recent Labs  09/06/13 11/03/13 02/10/14  NA 140 141 141  K 3.8 4.1 3.8  BUN 25* 30* 22*  CREATININE 1.1 1.0 1.2*  TSH  --   --  3.74    CBC:  Recent Labs  06/03/13 02/10/14  WBC 9.6 7.6  HGB 12.1 12.3  HCT 37 36  PLT 405* 292   Assessment/Plan GERD (gastroesophageal reflux disease) Stable, takes Omeprazole 20mg  daily.     Edema Trace in ankles, takes Maxzide 37.5/25mg  3x/week. Update BMP   Unspecified hypothyroidism Takes Synthroid 89mcg daily. TSH 2.900 04/05/13. 3.744 02/10/14    Hypertension Controlled on Losartan 100mg  and  Metoprolol 50mg  bid, monitor Bp daily    Unspecified constipation Stable on Senokot S I nightly     Depression Sleep better--continue  Mirtazapine 7.5mg  nightly. Weight stabilized    Family/ Staff Communication: observe the patient  Goals of Care: SNF  Labs/tests ordered: BMP

## 2014-05-03 NOTE — Assessment & Plan Note (Signed)
Stable on Senokot S I nightly  

## 2014-05-04 LAB — BASIC METABOLIC PANEL
BUN: 23 mg/dL — AB (ref 4–21)
Creatinine: 1 mg/dL (ref 0.5–1.1)
GLUCOSE: 94 mg/dL
POTASSIUM: 3.9 mmol/L (ref 3.4–5.3)
Sodium: 140 mmol/L (ref 137–147)

## 2014-05-10 ENCOUNTER — Other Ambulatory Visit: Payer: Self-pay | Admitting: Nurse Practitioner

## 2014-06-07 ENCOUNTER — Non-Acute Institutional Stay (SKILLED_NURSING_FACILITY): Payer: Medicare Other | Admitting: Nurse Practitioner

## 2014-06-07 ENCOUNTER — Encounter: Payer: Self-pay | Admitting: Nurse Practitioner

## 2014-06-07 DIAGNOSIS — E039 Hypothyroidism, unspecified: Secondary | ICD-10-CM

## 2014-06-07 DIAGNOSIS — R609 Edema, unspecified: Secondary | ICD-10-CM

## 2014-06-07 DIAGNOSIS — G4700 Insomnia, unspecified: Secondary | ICD-10-CM

## 2014-06-07 DIAGNOSIS — N390 Urinary tract infection, site not specified: Secondary | ICD-10-CM | POA: Insufficient documentation

## 2014-06-07 DIAGNOSIS — I10 Essential (primary) hypertension: Secondary | ICD-10-CM

## 2014-06-07 DIAGNOSIS — K219 Gastro-esophageal reflux disease without esophagitis: Secondary | ICD-10-CM

## 2014-06-07 DIAGNOSIS — M546 Pain in thoracic spine: Secondary | ICD-10-CM

## 2014-06-07 NOTE — Assessment & Plan Note (Signed)
Controlled on Losartan 100mg and  Metoprolol 50mg bid, monitor Bp daily  

## 2014-06-07 NOTE — Assessment & Plan Note (Signed)
Stable, takes Omeprazole 20mg daily.  

## 2014-06-07 NOTE — Assessment & Plan Note (Signed)
stable with Mirtazapine 7.5mg  nightly

## 2014-06-07 NOTE — Assessment & Plan Note (Signed)
Pain is managed with Naproxen 220mg  daily.

## 2014-06-07 NOTE — Progress Notes (Signed)
Patient ID: [name], female   DOB: October 19, 1913, 80 y.o.   MRN: [id number]   Code Status: DNR  No Known Allergies  Chief Complaint  Patient presents with  . Medical Management of Chronic Issues  . Acute Visit    UTI    HPI: Patient is a 80 y.o. female seen in the SNF at [facility] today for evaluation of UTI and chronic medical conditions.  Problem List Items Addressed This Visit   Urinary tract infection, site not specified     Presentation: extremely confused, foul urine, POA desires urine culture. 06/06/14 urine culture E.Coli and P. Mirabilis 2.000c/ml-will treat with Cipro 250mg  bid x 7 days since the patient is asymptomatic at advanced age of 7     Unspecified hypothyroidism     Takes Synthroid 22mcg daily. TSH 2.900 04/05/13. 3.744 02/10/14      Insomnia     stable with Mirtazapine 7.5mg  nightly       Hypertension     Controlled on Losartan 100mg  and  Metoprolol 50mg  bid, monitor Bp daily       GERD (gastroesophageal reflux disease)     Stable, takes Omeprazole 20mg  daily.       Edema - Primary (Chronic)     Trace in ankles, takes Maxzide 37.5/25mg  3x/week. Bun/creat23/1.05 Na 140 K3.9      Back pain, thoracic     Pain is managed with Naproxen 220mg  daily.         Review of System:Review of Systems  Constitutional: Negative for fever, chills, weight loss, malaise/fatigue and diaphoresis.  HENT: Positive for hearing loss. Negative for congestion, ear discharge, ear pain, nosebleeds, sore throat and tinnitus.   Eyes: Negative for blurred vision, double vision, photophobia, pain, discharge and redness.  Respiratory: Negative for cough, hemoptysis, sputum production, shortness of breath, wheezing and stridor.   Cardiovascular: Positive for leg swelling. Negative for chest pain, palpitations, orthopnea, claudication and PND.       Trace in ankles  Gastrointestinal: Negative for heartburn, nausea, vomiting, abdominal pain, diarrhea, constipation,  blood in stool and melena.  Genitourinary: Positive for frequency. Negative for dysuria, urgency, hematuria and flank pain.  Musculoskeletal: Negative for back pain, joint pain, myalgias and neck pain.  Skin: Negative for itching and rash.  Neurological: Negative for dizziness, tingling, tremors, sensory change, speech change, focal weakness, seizures, loss of consciousness, weakness and headaches.  Endo/Heme/Allergies: Negative for environmental allergies and polydipsia. Does not bruise/bleed easily.  Psychiatric/Behavioral: Positive for memory loss. Negative for depression, suicidal ideas, hallucinations and substance abuse. The patient is not nervous/anxious and does not have insomnia.      Past Medical History  Diagnosis Date  . Spasm of muscle 11/2011  . Thyroid disease   . Anemia of other chronic disease 2012  . Anxiety   . Peripheral vascular disease, unspecified 2011  . Reflux esophagitis 2011  . Vomiting alone 2011  . Benign neoplasm of colon 2011  . Type II or unspecified type diabetes mellitus without mention of complication, not stated as uncontrolled 2011  . Unspecified vitamin D deficiency 2011  . Hyperlipidemia   . Depression   . Alzheimer's disease 2011  . Unspecified hearing loss 2011  . Stricture and stenosis of esophagus 2011  . Diaphragmatic hernia without mention of obstruction or gangrene 2011  . Diverticulosis of colon (without mention of hemorrhage) 2011  . Lumbago 2011  . Dizziness and giddiness 2011  . Other malaise and fatigue 2011  . Abnormality  of gait 2011  . Senile dementia, uncomplicated 9562  . Hypertension    Past Surgical History  Procedure Laterality Date  . Tonsillectomy  1923  . Cataract extraction w/ intraocular lens  implant, bilateral  1985  . Retinal detachment surgery    . Esophageal dilation  2011  . Breast cyst aspiration     Social History:   reports that she has never smoked. She has never used smokeless tobacco. She reports  that she does not drink alcohol or use illicit drugs.  Medications: Patient's Medications  New Prescriptions   No medications on file  Previous Medications   ASPIRIN 81 MG TABLET    Take 81 mg by mouth daily.   CALCIUM CARBONATE-VITAMIN D (CALCIUM 500 + D) 500-125 MG-UNIT TABS    Take 1 tablet by mouth. Take one daily   CARBOXYMETHYLCELLUL-GLYCERIN (OPTIVE) 0.5-0.9 % SOLN    Apply 1 drop to eye 4 (four) times daily. Instill 1 drop in both eyes four times daily. *Wait 3-5 minutes between eye drops.*   LEVOTHYROXINE (SYNTHROID, LEVOTHROID) 25 MCG TABLET    Take 25 mcg by mouth daily before breakfast. *Check pulse weekly on Saturday.*   LOSARTAN (COZAAR) 100 MG TABLET    Take 100 mg by mouth daily.   METOPROLOL (LOPRESSOR) 50 MG TABLET    Take 50 mg by mouth 2 (two) times daily. Take one daily for blood pressure   MIRTAZAPINE (REMERON) 7.5 MG TABLET    Take 7.5 mg by mouth at bedtime.   MULTIPLE VITAMINS-MINERALS (CERTAVITE SENIOR/ANTIOXIDANT PO)    Take 1 tablet by mouth. Take one daily   NAPROXEN SODIUM (ANAPROX) 220 MG TABLET    Take 220 mg by mouth daily. For back pain.   OMEPRAZOLE (PRILOSEC) 20 MG CAPSULE    Take 20 mg by mouth daily. *Do Not Crush* Take on a empty stomach.   SENNOSIDES-DOCUSATE SODIUM (SENOKOT-S) 8.6-50 MG TABLET    Take 1 tablet by mouth at bedtime.   TRIAMTERENE-HYDROCHLOROTHIAZIDE (MAXZIDE-25) 37.5-25 MG PER TABLET    Take 1 tablet by mouth daily. Take 1 tablet on Mon, Wed, and Fri by mouth.  Modified Medications   No medications on file  Discontinued Medications   No medications on file     Physical Exam: Physical Exam  Constitutional: She is oriented to person, place, and time. She appears well-developed and well-nourished. No distress.  HENT:  Head: Normocephalic and atraumatic.  Right Ear: External ear normal.  Left Ear: External ear normal.  Nose: Nose normal.  Mouth/Throat: Oropharynx is clear and moist. No oropharyngeal exudate.  Eyes: Conjunctivae  and EOM are normal. Pupils are equal, round, and reactive to light. Right eye exhibits no discharge. Left eye exhibits no discharge. No scleral icterus.  Neck: Normal range of motion. Neck supple. No JVD present. No tracheal deviation present. No thyromegaly present.  Cardiovascular: Normal rate, regular rhythm and normal heart sounds.  Exam reveals no friction rub.   No murmur heard. Pulmonary/Chest: Effort normal and breath sounds normal. No stridor. No respiratory distress. She has no wheezes. She has no rales. She exhibits no tenderness.  Abdominal: Soft. Bowel sounds are normal. She exhibits no distension. There is no tenderness. There is no rebound and no guarding.  Musculoskeletal: Normal range of motion. She exhibits edema. She exhibits no tenderness.  Trace edema in BLE. Ambulates with walker.   Lymphadenopathy:    She has no cervical adenopathy.  Neurological: She is alert and oriented to person, place, and  time. She has normal reflexes. No cranial nerve deficit. She exhibits normal muscle tone. Coordination normal.  Skin: Skin is warm and dry. No rash noted. She is not diaphoretic. No erythema. No pallor.  Psychiatric: She has a normal mood and affect. Her behavior is normal. Thought content normal. Cognition and memory are impaired. She expresses impulsivity and inappropriate judgment. She exhibits abnormal recent memory.    Filed Vitals:   06/07/14 1303  BP: 130/76  Pulse: 76  Temp: 97.2 F (36.2 C)  TempSrc: Tympanic  Resp: 18      Labs reviewed: Basic Metabolic Panel:  Recent Labs  11/03/13 02/10/14 05/04/14  NA 141 141 140  K 4.1 3.8 3.9  BUN 30* 22* 23*  CREATININE 1.0 1.2* 1.0  TSH  --  3.74  --     CBC:  Recent Labs  02/10/14  WBC 7.6  HGB 12.3  HCT 36  PLT 292   Assessment/Plan Edema Trace in ankles, takes Maxzide 37.5/25mg  3x/week. Bun/creat23/1.05 Na 140 K3.9    Unspecified hypothyroidism Takes Synthroid 69mcg daily. TSH 2.900 04/05/13.  3.744 02/10/14    Hypertension Controlled on Losartan 100mg  and  Metoprolol 50mg  bid, monitor Bp daily     GERD (gastroesophageal reflux disease) Stable, takes Omeprazole 20mg  daily.     Insomnia stable with Mirtazapine 7.5mg  nightly     Back pain, thoracic Pain is managed with Naproxen 220mg  daily.    Urinary tract infection, site not specified Presentation: extremely confused, foul urine, POA desires urine culture. 06/06/14 urine culture E.Coli and P. Mirabilis 2.000c/ml-will treat with Cipro 250mg  bid x 7 days since the patient is asymptomatic at advanced age of 33     Family/ Staff Communication: observe the patient  Goals of Care: SNF  Labs/tests ordered: urine culture done 06/06/14

## 2014-06-07 NOTE — Assessment & Plan Note (Addendum)
Presentation: extremely confused, foul urine, POA desires urine culture. 06/06/14 urine culture E.Coli and P. Mirabilis 2.000c/ml-will treat with Cipro 250mg  bid x 7 days since the patient is asymptomatic at advanced age of 89

## 2014-06-07 NOTE — Assessment & Plan Note (Signed)
Takes Synthroid 65mcg daily. TSH 2.900 04/05/13. 3.744 02/10/14

## 2014-06-07 NOTE — Assessment & Plan Note (Signed)
Trace in ankles, takes Maxzide 37.5/25mg  3x/week. Bun/creat23/1.05 Na 140 K3.9

## 2014-07-10 ENCOUNTER — Non-Acute Institutional Stay (SKILLED_NURSING_FACILITY): Payer: Medicare Other | Admitting: Nurse Practitioner

## 2014-07-10 ENCOUNTER — Encounter: Payer: Self-pay | Admitting: Nurse Practitioner

## 2014-07-10 DIAGNOSIS — G4700 Insomnia, unspecified: Secondary | ICD-10-CM

## 2014-07-10 DIAGNOSIS — R609 Edema, unspecified: Secondary | ICD-10-CM

## 2014-07-10 DIAGNOSIS — K5900 Constipation, unspecified: Secondary | ICD-10-CM

## 2014-07-10 DIAGNOSIS — I10 Essential (primary) hypertension: Secondary | ICD-10-CM

## 2014-07-10 DIAGNOSIS — K219 Gastro-esophageal reflux disease without esophagitis: Secondary | ICD-10-CM

## 2014-07-10 DIAGNOSIS — E039 Hypothyroidism, unspecified: Secondary | ICD-10-CM

## 2014-07-10 NOTE — Assessment & Plan Note (Signed)
Trace in ankles, takes Maxzide 37.5/25mg  3x/week. Bun/creat23/1.05 Na 140 K3.9

## 2014-07-10 NOTE — Assessment & Plan Note (Signed)
Controlled on Losartan 100mg and  Metoprolol 50mg bid, monitor Bp daily  

## 2014-07-10 NOTE — Progress Notes (Signed)
Patient ID: [name], female   DOB: June 10, 1913, 80 y.o.   MRN: [id number]   Code Status: DNR  No Known Allergies  Chief Complaint  Patient presents with  . Medical Management of Chronic Issues    HPI: Patient is a 80 y.o. female seen in the SNF at [facility] today for evaluation of chronic medical conditions.  Problem List Items Addressed This Visit   Edema - Primary (Chronic)     Trace in ankles, takes Maxzide 37.5/25mg  3x/week. Bun/creat23/1.05 Na 140 K3.9     Hypertension     Controlled on Losartan 100mg  and  Metoprolol 50mg  bid, monitor Bp daily     Unspecified hypothyroidism     Takes Synthroid 60mcg daily. TSH 2.900 04/05/13. 3.744 02/10/14       Insomnia     stable with Mirtazapine 7.5mg  nightly      Unspecified constipation     Stable on Senokot S I nightly       GERD (gastroesophageal reflux disease)     Stable, takes Omeprazole 20mg  daily.          Review of System:Review of Systems  Constitutional: Negative for fever, chills, weight loss, malaise/fatigue and diaphoresis.  HENT: Positive for hearing loss. Negative for congestion, ear discharge, ear pain, nosebleeds, sore throat and tinnitus.   Eyes: Negative for blurred vision, double vision, photophobia, pain, discharge and redness.  Respiratory: Negative for cough, hemoptysis, sputum production, shortness of breath, wheezing and stridor.   Cardiovascular: Positive for leg swelling. Negative for chest pain, palpitations, orthopnea, claudication and PND.       Trace in ankles  Gastrointestinal: Negative for heartburn, nausea, vomiting, abdominal pain, diarrhea, constipation, blood in stool and melena.  Genitourinary: Positive for frequency. Negative for dysuria, urgency, hematuria and flank pain.  Musculoskeletal: Negative for back pain, joint pain, myalgias and neck pain.  Skin: Negative for itching and rash.  Neurological: Negative for dizziness, tingling, tremors, sensory change, speech  change, focal weakness, seizures, loss of consciousness, weakness and headaches.  Endo/Heme/Allergies: Negative for environmental allergies and polydipsia. Does not bruise/bleed easily.  Psychiatric/Behavioral: Positive for memory loss. Negative for depression, suicidal ideas, hallucinations and substance abuse. The patient is not nervous/anxious and does not have insomnia.      Past Medical History  Diagnosis Date  . Spasm of muscle 11/2011  . Thyroid disease   . Anemia of other chronic disease 2012  . Anxiety   . Peripheral vascular disease, unspecified 2011  . Reflux esophagitis 2011  . Vomiting alone 2011  . Benign neoplasm of colon 2011  . Type II or unspecified type diabetes mellitus without mention of complication, not stated as uncontrolled 2011  . Unspecified vitamin D deficiency 2011  . Hyperlipidemia   . Depression   . Alzheimer's disease 2011  . Unspecified hearing loss 2011  . Stricture and stenosis of esophagus 2011  . Diaphragmatic hernia without mention of obstruction or gangrene 2011  . Diverticulosis of colon (without mention of hemorrhage) 2011  . Lumbago 2011  . Dizziness and giddiness 2011  . Other malaise and fatigue 2011  . Abnormality of gait 2011  . Senile dementia, uncomplicated 4098  . Hypertension    Past Surgical History  Procedure Laterality Date  . Tonsillectomy  1923  . Cataract extraction w/ intraocular lens  implant, bilateral  1985  . Retinal detachment surgery    . Esophageal dilation  2011  . Breast cyst aspiration     Social History:  reports that she has never smoked. She has never used smokeless tobacco. She reports that she does not drink alcohol or use illicit drugs.  Medications: Patient's Medications  New Prescriptions   No medications on file  Previous Medications   ASPIRIN 81 MG TABLET    Take 81 mg by mouth daily.   CALCIUM CARBONATE-VITAMIN D (CALCIUM 500 + D) 500-125 MG-UNIT TABS    Take 1 tablet by mouth. Take one  daily   CARBOXYMETHYLCELLUL-GLYCERIN (OPTIVE) 0.5-0.9 % SOLN    Apply 1 drop to eye 4 (four) times daily. Instill 1 drop in both eyes four times daily. *Wait 3-5 minutes between eye drops.*   LEVOTHYROXINE (SYNTHROID, LEVOTHROID) 25 MCG TABLET    Take 25 mcg by mouth daily before breakfast. *Check pulse weekly on Saturday.*   LOSARTAN (COZAAR) 100 MG TABLET    Take 100 mg by mouth daily.   METOPROLOL (LOPRESSOR) 50 MG TABLET    Take 50 mg by mouth 2 (two) times daily. Take one daily for blood pressure   MIRTAZAPINE (REMERON) 7.5 MG TABLET    Take 7.5 mg by mouth at bedtime.   MULTIPLE VITAMINS-MINERALS (CERTAVITE SENIOR/ANTIOXIDANT PO)    Take 1 tablet by mouth. Take one daily   NAPROXEN SODIUM (ANAPROX) 220 MG TABLET    Take 220 mg by mouth daily. For back pain.   OMEPRAZOLE (PRILOSEC) 20 MG CAPSULE    Take 20 mg by mouth daily. *Do Not Crush* Take on a empty stomach.   SENNOSIDES-DOCUSATE SODIUM (SENOKOT-S) 8.6-50 MG TABLET    Take 1 tablet by mouth at bedtime.   TRIAMTERENE-HYDROCHLOROTHIAZIDE (MAXZIDE-25) 37.5-25 MG PER TABLET    Take 1 tablet by mouth daily. Take 1 tablet on Mon, Wed, and Fri by mouth.  Modified Medications   No medications on file  Discontinued Medications   No medications on file     Physical Exam: Physical Exam  Constitutional: She is oriented to person, place, and time. She appears well-developed and well-nourished. No distress.  HENT:  Head: Normocephalic and atraumatic.  Right Ear: External ear normal.  Left Ear: External ear normal.  Nose: Nose normal.  Mouth/Throat: Oropharynx is clear and moist. No oropharyngeal exudate.  Eyes: Conjunctivae and EOM are normal. Pupils are equal, round, and reactive to light. Right eye exhibits no discharge. Left eye exhibits no discharge. No scleral icterus.  Neck: Normal range of motion. Neck supple. No JVD present. No tracheal deviation present. No thyromegaly present.  Cardiovascular: Normal rate, regular rhythm and  normal heart sounds.  Exam reveals no friction rub.   No murmur heard. Pulmonary/Chest: Effort normal and breath sounds normal. No stridor. No respiratory distress. She has no wheezes. She has no rales. She exhibits no tenderness.  Abdominal: Soft. Bowel sounds are normal. She exhibits no distension. There is no tenderness. There is no rebound and no guarding.  Musculoskeletal: Normal range of motion. She exhibits edema. She exhibits no tenderness.  Trace edema in BLE. Ambulates with walker.   Lymphadenopathy:    She has no cervical adenopathy.  Neurological: She is alert and oriented to person, place, and time. She has normal reflexes. No cranial nerve deficit. She exhibits normal muscle tone. Coordination normal.  Skin: Skin is warm and dry. No rash noted. She is not diaphoretic. No erythema. No pallor.  Psychiatric: She has a normal mood and affect. Her behavior is normal. Thought content normal. Cognition and memory are impaired. She expresses impulsivity and inappropriate judgment. She exhibits abnormal recent  memory.    Filed Vitals:   07/10/14 1632  BP: 142/58  Pulse: 92  Temp: 97.2 F (36.2 C)  TempSrc: Tympanic  Resp: 16      Labs reviewed: Basic Metabolic Panel:  Recent Labs  11/03/13 02/10/14 05/04/14  NA 141 141 140  K 4.1 3.8 3.9  BUN 30* 22* 23*  CREATININE 1.0 1.2* 1.0  TSH  --  3.74  --     CBC:  Recent Labs  02/10/14  WBC 7.6  HGB 12.3  HCT 36  PLT 292   Assessment/Plan Edema Trace in ankles, takes Maxzide 37.5/25mg  3x/week. Bun/creat23/1.05 Na 140 K3.9   Unspecified hypothyroidism Takes Synthroid 49mcg daily. TSH 2.900 04/05/13. 3.744 02/10/14     Hypertension Controlled on Losartan 100mg  and  Metoprolol 50mg  bid, monitor Bp daily   GERD (gastroesophageal reflux disease) Stable, takes Omeprazole 20mg  daily.     Insomnia stable with Mirtazapine 7.5mg  nightly    Unspecified constipation Stable on Senokot S I  nightly       Family/ Staff Communication: observe the patient  Goals of Care: SNF  Labs/tests ordered: none

## 2014-07-10 NOTE — Assessment & Plan Note (Signed)
Stable, takes Omeprazole 20mg daily.  

## 2014-07-10 NOTE — Assessment & Plan Note (Signed)
Stable on Senokot S I nightly  

## 2014-07-10 NOTE — Assessment & Plan Note (Signed)
stable with Mirtazapine 7.5mg  nightly

## 2014-07-10 NOTE — Assessment & Plan Note (Signed)
Takes Synthroid 94mcg daily. TSH 2.900 04/05/13. 3.744 02/10/14

## 2014-07-11 ENCOUNTER — Encounter: Payer: Self-pay | Admitting: *Deleted

## 2014-07-17 ENCOUNTER — Encounter: Payer: Self-pay | Admitting: Nurse Practitioner

## 2014-07-26 LAB — BASIC METABOLIC PANEL
BUN: 22 mg/dL — AB (ref 4–21)
Creatinine: 1.1 mg/dL (ref 0.5–1.1)
Glucose: 117 mg/dL
Potassium: 4.1 mmol/L (ref 3.4–5.3)
Sodium: 138 mmol/L (ref 137–147)

## 2014-08-07 ENCOUNTER — Encounter: Payer: Self-pay | Admitting: Nurse Practitioner

## 2014-08-07 ENCOUNTER — Non-Acute Institutional Stay (SKILLED_NURSING_FACILITY): Payer: Medicare Other | Admitting: Nurse Practitioner

## 2014-08-07 DIAGNOSIS — F32A Depression, unspecified: Secondary | ICD-10-CM

## 2014-08-07 DIAGNOSIS — I10 Essential (primary) hypertension: Secondary | ICD-10-CM

## 2014-08-07 DIAGNOSIS — G309 Alzheimer's disease, unspecified: Secondary | ICD-10-CM

## 2014-08-07 DIAGNOSIS — F3289 Other specified depressive episodes: Secondary | ICD-10-CM

## 2014-08-07 DIAGNOSIS — F0280 Dementia in other diseases classified elsewhere without behavioral disturbance: Secondary | ICD-10-CM

## 2014-08-07 DIAGNOSIS — K219 Gastro-esophageal reflux disease without esophagitis: Secondary | ICD-10-CM

## 2014-08-07 DIAGNOSIS — E039 Hypothyroidism, unspecified: Secondary | ICD-10-CM

## 2014-08-07 DIAGNOSIS — K5900 Constipation, unspecified: Secondary | ICD-10-CM

## 2014-08-07 DIAGNOSIS — F329 Major depressive disorder, single episode, unspecified: Secondary | ICD-10-CM

## 2014-08-07 DIAGNOSIS — R609 Edema, unspecified: Secondary | ICD-10-CM

## 2014-08-07 NOTE — Assessment & Plan Note (Signed)
Baseline mentation now and no change since off  Namenda and Aricept 06/13/13.  MMSE 11/30 03/21/13. Observe the patient. Etiology of her confusion.    

## 2014-08-07 NOTE — Assessment & Plan Note (Signed)
Controlled on Losartan 100mg and  Metoprolol 50mg bid, monitor Bp daily  

## 2014-08-07 NOTE — Assessment & Plan Note (Signed)
Stable, takes Omeprazole 20mg daily.  

## 2014-08-07 NOTE — Assessment & Plan Note (Addendum)
Trace in ankles, takes Maxzide 37.5/25mg  3x/week. Bun/creat 22/1.13 07/26/14. Dry, warm, scaly, mild edema in ankles as prior-continue to monitor "cold and edmematous BLE"

## 2014-08-07 NOTE — Progress Notes (Signed)
Patient ID: [name], female   DOB: 08-Oct-1913, 80 y.o.   MRN: [id number]   Code Status: DNR  No Known Allergies  Chief Complaint  Patient presents with  . Medical Management of Chronic Issues  . Acute Visit    cold and edematous legs. confusion.     HPI: Patient is a 80 y.o. female seen in the SNF at [facility] today for evaluation of confusion, cold and edematous legs, and chronic medical conditions.  Problem List Items Addressed This Visit   Alzheimer's disease     Baseline mentation now and no change since off  Namenda and Aricept 06/13/13.  MMSE 11/30 03/21/13. Observe the patient. Etiology of her confusion.      Depression     stable with Mirtazapine 7.5mg  nightly      Edema - Primary (Chronic)     Trace in ankles, takes Maxzide 37.5/25mg  3x/week. Bun/creat 22/1.13 07/26/14. Dry, warm, scaly, mild edema in ankles as prior-continue to monitor "cold and edmematous BLE"      GERD (gastroesophageal reflux disease)     Stable, takes Omeprazole 20mg  daily.       Hypertension     Controlled on Losartan 100mg  and  Metoprolol 50mg  bid, monitor Bp daily      Unspecified constipation     Stable on Senokot S I nightly     Unspecified hypothyroidism     Takes Synthroid 84mcg daily. TSH 2.900 04/05/13. 3.744 02/10/14        Review of System:Review of Systems  Constitutional: Negative for fever, chills, weight loss, malaise/fatigue and diaphoresis.  HENT: Positive for hearing loss. Negative for congestion, ear discharge, ear pain, nosebleeds, sore throat and tinnitus.   Eyes: Negative for blurred vision, double vision, photophobia, pain, discharge and redness.  Respiratory: Negative for cough, hemoptysis, sputum production, shortness of breath, wheezing and stridor.   Cardiovascular: Positive for leg swelling. Negative for chest pain, palpitations, orthopnea, claudication and PND.       Trace in ankles  Gastrointestinal: Negative for heartburn, nausea, vomiting,  abdominal pain, diarrhea, constipation, blood in stool and melena.  Genitourinary: Positive for frequency. Negative for dysuria, urgency, hematuria and flank pain.  Musculoskeletal: Negative for back pain, joint pain, myalgias and neck pain.  Skin: Negative for itching and rash.       BLE warmth to touch.   Neurological: Negative for dizziness, tingling, tremors, sensory change, speech change, focal weakness, seizures, loss of consciousness, weakness and headaches.  Endo/Heme/Allergies: Negative for environmental allergies and polydipsia. Does not bruise/bleed easily.  Psychiatric/Behavioral: Positive for memory loss. Negative for depression, suicidal ideas, hallucinations and substance abuse. The patient is not nervous/anxious and does not have insomnia.      Past Medical History  Diagnosis Date  . Spasm of muscle 11/2011  . Thyroid disease   . Anemia of other chronic disease 2012  . Anxiety   . Peripheral vascular disease, unspecified 2011  . Reflux esophagitis 2011  . Vomiting alone 2011  . Benign neoplasm of colon 2011  . Type II or unspecified type diabetes mellitus without mention of complication, not stated as uncontrolled 2011  . Unspecified vitamin D deficiency 2011  . Hyperlipidemia   . Depression   . Alzheimer's disease 2011  . Unspecified hearing loss 2011  . Stricture and stenosis of esophagus 2011  . Diaphragmatic hernia without mention of obstruction or gangrene 2011  . Diverticulosis of colon (without mention of hemorrhage) 2011  . Lumbago 2011  .  Dizziness and giddiness 2011  . Other malaise and fatigue 2011  . Abnormality of gait 2011  . Senile dementia, uncomplicated 9323  . Hypertension    Past Surgical History  Procedure Laterality Date  . Tonsillectomy  1923  . Cataract extraction w/ intraocular lens  implant, bilateral  1985  . Retinal detachment surgery    . Esophageal dilation  2011  . Breast cyst aspiration     Social History:   reports that she  has never smoked. She has never used smokeless tobacco. She reports that she does not drink alcohol or use illicit drugs.  Medications: Patient's Medications  New Prescriptions   No medications on file  Previous Medications   ACETAMINOPHEN (TYLENOL) 325 MG TABLET    Take 650 mg by mouth every 6 (six) hours as needed for mild pain or moderate pain.   ASPIRIN 81 MG TABLET    Take 81 mg by mouth daily.   CALCIUM CARBONATE-VITAMIN D (CALCIUM 500 + D) 500-125 MG-UNIT TABS    Take 1 tablet by mouth. Take one daily   CARBOXYMETHYLCELLUL-GLYCERIN (OPTIVE) 0.5-0.9 % SOLN    Apply 1 drop to eye 4 (four) times daily. Instill 1 drop in both eyes four times daily. *Wait 3-5 minutes between eye drops.*   LEVOTHYROXINE (SYNTHROID, LEVOTHROID) 25 MCG TABLET    Take 25 mcg by mouth daily before breakfast. *Check pulse weekly on Saturday.*   LOSARTAN (COZAAR) 100 MG TABLET    Take 100 mg by mouth daily.   METOPROLOL (LOPRESSOR) 50 MG TABLET    Take 50 mg by mouth 2 (two) times daily. For blood pressure   MIRTAZAPINE (REMERON) 7.5 MG TABLET    Take 7.5 mg by mouth at bedtime.   MULTIPLE VITAMINS-MINERALS (CERTAVITE SENIOR/ANTIOXIDANT PO)    Take 1 tablet by mouth. Take one daily   NAPROXEN SODIUM (ANAPROX) 220 MG TABLET    Take 220 mg by mouth daily. For back pain.   OMEPRAZOLE (PRILOSEC) 20 MG CAPSULE    Take 20 mg by mouth daily. *Do Not Crush* Take on a empty stomach.   SENNOSIDES-DOCUSATE SODIUM (SENOKOT-S) 8.6-50 MG TABLET    Take 1 tablet by mouth at bedtime.   TRIAMTERENE-HYDROCHLOROTHIAZIDE (MAXZIDE-25) 37.5-25 MG PER TABLET    Take 1 tablet by mouth on Mon, Wed, and Fri by mouth.  Modified Medications   No medications on file  Discontinued Medications   No medications on file     Physical Exam: Physical Exam  Constitutional: She is oriented to person, place, and time. She appears well-developed and well-nourished. No distress.  HENT:  Head: Normocephalic and atraumatic.  Right Ear: External ear  normal.  Left Ear: External ear normal.  Nose: Nose normal.  Mouth/Throat: Oropharynx is clear and moist. No oropharyngeal exudate.  Eyes: Conjunctivae and EOM are normal. Pupils are equal, round, and reactive to light. Right eye exhibits no discharge. Left eye exhibits no discharge. No scleral icterus.  Neck: Normal range of motion. Neck supple. No JVD present. No tracheal deviation present. No thyromegaly present.  Cardiovascular: Normal rate, regular rhythm and normal heart sounds.  Exam reveals no friction rub.   No murmur heard. Pulmonary/Chest: Effort normal and breath sounds normal. No stridor. No respiratory distress. She has no wheezes. She has no rales. She exhibits no tenderness.  Abdominal: Soft. Bowel sounds are normal. She exhibits no distension. There is no tenderness. There is no rebound and no guarding.  Musculoskeletal: Normal range of motion. She exhibits edema.  She exhibits no tenderness.  Trace edema in BLE. Ambulates with walker.   Lymphadenopathy:    She has no cervical adenopathy.  Neurological: She is alert and oriented to person, place, and time. She has normal reflexes. No cranial nerve deficit. She exhibits normal muscle tone. Coordination normal.  Skin: Skin is warm and dry. No rash noted. She is not diaphoretic. No erythema. No pallor.  Dry scaly warm BLE  Psychiatric: She has a normal mood and affect. Her behavior is normal. Thought content normal. Cognition and memory are impaired. She expresses impulsivity and inappropriate judgment. She exhibits abnormal recent memory.    Filed Vitals:   08/07/14 1613  BP: 124/82  Pulse: 57  Temp: 97.5 F (36.4 C)  TempSrc: Tympanic  Resp: 16      Labs reviewed: Basic Metabolic Panel:  Recent Labs  02/10/14 05/04/14 07/26/14  NA 141 140 138  K 3.8 3.9 4.1  BUN 22* 23* 22*  CREATININE 1.2* 1.0 1.1  TSH 3.74  --   --     CBC:  Recent Labs  02/10/14  WBC 7.6  HGB 12.3  HCT 36  PLT 292    Assessment/Plan Edema Trace in ankles, takes Maxzide 37.5/25mg  3x/week. Bun/creat 22/1.13 07/26/14. Dry, warm, scaly, mild edema in ankles as prior-continue to monitor "cold and edmematous BLE"    Unspecified hypothyroidism Takes Synthroid 50mcg daily. TSH 2.900 04/05/13. 3.744 02/10/14   GERD (gastroesophageal reflux disease) Stable, takes Omeprazole 20mg  daily.     Depression stable with Mirtazapine 7.5mg  nightly    Unspecified constipation Stable on Senokot S I nightly   Hypertension Controlled on Losartan 100mg  and  Metoprolol 50mg  bid, monitor Bp daily    Alzheimer's disease Baseline mentation now and no change since off  Namenda and Aricept 06/13/13.  MMSE 11/30 03/21/13. Observe the patient. Etiology of her confusion.      Family/ Staff Communication: observe the patient  Goals of Care: SNF  Labs/tests ordered: none

## 2014-08-07 NOTE — Assessment & Plan Note (Signed)
Takes Synthroid 73mcg daily. TSH 2.900 04/05/13. 3.744 02/10/14

## 2014-08-07 NOTE — Assessment & Plan Note (Signed)
stable with Mirtazapine 7.5mg  nightly

## 2014-08-07 NOTE — Assessment & Plan Note (Signed)
Stable on Senokot S I nightly  

## 2014-09-06 ENCOUNTER — Non-Acute Institutional Stay (SKILLED_NURSING_FACILITY): Payer: Medicare Other | Admitting: Nurse Practitioner

## 2014-09-06 ENCOUNTER — Encounter: Payer: Self-pay | Admitting: Nurse Practitioner

## 2014-09-06 DIAGNOSIS — F0280 Dementia in other diseases classified elsewhere without behavioral disturbance: Secondary | ICD-10-CM

## 2014-09-06 DIAGNOSIS — F32A Depression, unspecified: Secondary | ICD-10-CM

## 2014-09-06 DIAGNOSIS — K219 Gastro-esophageal reflux disease without esophagitis: Secondary | ICD-10-CM

## 2014-09-06 DIAGNOSIS — G309 Alzheimer's disease, unspecified: Secondary | ICD-10-CM

## 2014-09-06 DIAGNOSIS — F3289 Other specified depressive episodes: Secondary | ICD-10-CM

## 2014-09-06 DIAGNOSIS — G4700 Insomnia, unspecified: Secondary | ICD-10-CM

## 2014-09-06 DIAGNOSIS — I10 Essential (primary) hypertension: Secondary | ICD-10-CM

## 2014-09-06 DIAGNOSIS — K5900 Constipation, unspecified: Secondary | ICD-10-CM

## 2014-09-06 DIAGNOSIS — E039 Hypothyroidism, unspecified: Secondary | ICD-10-CM

## 2014-09-06 DIAGNOSIS — F329 Major depressive disorder, single episode, unspecified: Secondary | ICD-10-CM

## 2014-09-06 DIAGNOSIS — R609 Edema, unspecified: Secondary | ICD-10-CM

## 2014-09-06 NOTE — Progress Notes (Signed)
Patient ID: [name], female   DOB: September 19, 1913, 80 y.o.   MRN: [id number]   Code Status: DNR  No Known Allergies  Chief Complaint  Patient presents with  . Medical Management of Chronic Issues    HPI: Patient is a 80 y.o. female seen in the SNF at [facility] today for evaluation of chronic medical conditions.  Problem List Items Addressed This Visit   Unspecified hypothyroidism     Takes Synthroid 93mcg daily. TSH 2.900 04/05/13. 3.744 02/10/14      Unspecified constipation     Stable on Senokot S I nightly    Insomnia     stable with Mirtazapine 7.5mg  nightly       Hypertension - Primary     Controlled on Losartan 100mg  and  Metoprolol 50mg  bid, monitor Bp daily       GERD (gastroesophageal reflux disease)     Stable, takes Omeprazole 20mg  daily.      Edema (Chronic)     Trace in ankles, takes Maxzide 37.5/25mg  3x/week. Bun/creat 22/1.13 07/26/14. Dry, warm, scaly, mild edema in ankles as prior-continue to monitor "cold and edmematous BLE"      Depression     stable with Mirtazapine 7.5mg  nightly       Alzheimer's disease     Baseline mentation now and no change since off  Namenda and Aricept 06/13/13.  MMSE 11/30 03/21/13. Observe the patient. Etiology of her confusion.          Review of System:Review of Systems  Constitutional: Negative for fever, chills, weight loss, malaise/fatigue and diaphoresis.  HENT: Positive for hearing loss. Negative for congestion, ear discharge, ear pain, nosebleeds, sore throat and tinnitus.   Eyes: Negative for blurred vision, double vision, photophobia, pain, discharge and redness.  Respiratory: Negative for cough, hemoptysis, sputum production, shortness of breath, wheezing and stridor.   Cardiovascular: Positive for leg swelling. Negative for chest pain, palpitations, orthopnea, claudication and PND.       Trace in ankles  Gastrointestinal: Negative for heartburn, nausea, vomiting, abdominal pain, diarrhea,  constipation, blood in stool and melena.  Genitourinary: Positive for frequency. Negative for dysuria, urgency, hematuria and flank pain.  Musculoskeletal: Negative for back pain, joint pain, myalgias and neck pain.  Skin: Negative for itching and rash.       BLE warmth to touch.   Neurological: Negative for dizziness, tingling, tremors, sensory change, speech change, focal weakness, seizures, loss of consciousness, weakness and headaches.  Endo/Heme/Allergies: Negative for environmental allergies and polydipsia. Does not bruise/bleed easily.  Psychiatric/Behavioral: Positive for memory loss. Negative for depression, suicidal ideas, hallucinations and substance abuse. The patient is not nervous/anxious and does not have insomnia.      Past Medical History  Diagnosis Date  . Spasm of muscle 11/2011  . Thyroid disease   . Anemia of other chronic disease 2012  . Anxiety   . Peripheral vascular disease, unspecified 2011  . Reflux esophagitis 2011  . Vomiting alone 2011  . Benign neoplasm of colon 2011  . Type II or unspecified type diabetes mellitus without mention of complication, not stated as uncontrolled 2011  . Unspecified vitamin D deficiency 2011  . Hyperlipidemia   . Depression   . Alzheimer's disease 2011  . Unspecified hearing loss 2011  . Stricture and stenosis of esophagus 2011  . Diaphragmatic hernia without mention of obstruction or gangrene 2011  . Diverticulosis of colon (without mention of hemorrhage) 2011  . Lumbago 2011  . Dizziness  and giddiness 2011  . Other malaise and fatigue 2011  . Abnormality of gait 2011  . Senile dementia, uncomplicated 1027  . Hypertension    Past Surgical History  Procedure Laterality Date  . Tonsillectomy  1923  . Cataract extraction w/ intraocular lens  implant, bilateral  1985  . Retinal detachment surgery    . Esophageal dilation  2011  . Breast cyst aspiration     Social History:   reports that she has never smoked. She has  never used smokeless tobacco. She reports that she does not drink alcohol or use illicit drugs.  Medications: Patient's Medications  New Prescriptions   No medications on file  Previous Medications   ACETAMINOPHEN (TYLENOL) 325 MG TABLET    Take 650 mg by mouth every 6 (six) hours as needed for mild pain or moderate pain.   ASPIRIN 81 MG TABLET    Take 81 mg by mouth daily.   CALCIUM CARBONATE-VITAMIN D (CALCIUM 500 + D) 500-125 MG-UNIT TABS    Take 1 tablet by mouth. Take one daily   CARBOXYMETHYLCELLUL-GLYCERIN (OPTIVE) 0.5-0.9 % SOLN    Apply 1 drop to eye 4 (four) times daily. Instill 1 drop in both eyes four times daily. *Wait 3-5 minutes between eye drops.*   LEVOTHYROXINE (SYNTHROID, LEVOTHROID) 25 MCG TABLET    Take 25 mcg by mouth daily before breakfast. *Check pulse weekly on Saturday.*   LOSARTAN (COZAAR) 100 MG TABLET    Take 100 mg by mouth daily.   METOPROLOL (LOPRESSOR) 50 MG TABLET    Take 50 mg by mouth 2 (two) times daily. For blood pressure   MIRTAZAPINE (REMERON) 7.5 MG TABLET    Take 7.5 mg by mouth at bedtime.   MULTIPLE VITAMINS-MINERALS (CERTAVITE SENIOR/ANTIOXIDANT PO)    Take 1 tablet by mouth. Take one daily   NAPROXEN SODIUM (ANAPROX) 220 MG TABLET    Take 220 mg by mouth daily. For back pain.   OMEPRAZOLE (PRILOSEC) 20 MG CAPSULE    Take 20 mg by mouth daily. *Do Not Crush* Take on a empty stomach.   SENNOSIDES-DOCUSATE SODIUM (SENOKOT-S) 8.6-50 MG TABLET    Take 1 tablet by mouth at bedtime.   TRIAMTERENE-HYDROCHLOROTHIAZIDE (MAXZIDE-25) 37.5-25 MG PER TABLET    Take 1 tablet by mouth on Mon, Wed, and Fri by mouth.  Modified Medications   No medications on file  Discontinued Medications   No medications on file     Physical Exam: Physical Exam  Constitutional: She is oriented to person, place, and time. She appears well-developed and well-nourished. No distress.  HENT:  Head: Normocephalic and atraumatic.  Right Ear: External ear normal.  Left Ear:  External ear normal.  Nose: Nose normal.  Mouth/Throat: Oropharynx is clear and moist. No oropharyngeal exudate.  Eyes: Conjunctivae and EOM are normal. Pupils are equal, round, and reactive to light. Right eye exhibits no discharge. Left eye exhibits no discharge. No scleral icterus.  Neck: Normal range of motion. Neck supple. No JVD present. No tracheal deviation present. No thyromegaly present.  Cardiovascular: Normal rate, regular rhythm and normal heart sounds.  Exam reveals no friction rub.   No murmur heard. Pulmonary/Chest: Effort normal. No stridor. No respiratory distress. She has no wheezes. She has rales. She exhibits no tenderness.  Bibasilar   Abdominal: Soft. Bowel sounds are normal. She exhibits no distension. There is no tenderness. There is no rebound and no guarding.  Musculoskeletal: Normal range of motion. She exhibits edema. She exhibits no  tenderness.  Trace edema in BLE. Ambulates with walker.   Lymphadenopathy:    She has no cervical adenopathy.  Neurological: She is alert and oriented to person, place, and time. She has normal reflexes. No cranial nerve deficit. She exhibits normal muscle tone. Coordination normal.  Skin: Skin is warm and dry. No rash noted. She is not diaphoretic. No erythema. No pallor.  Dry scaly warm BLE  Psychiatric: She has a normal mood and affect. Her behavior is normal. Thought content normal. Cognition and memory are impaired. She expresses impulsivity and inappropriate judgment. She exhibits abnormal recent memory.    Filed Vitals:   09/06/14 1224  BP: 118/72  Pulse: 68  Temp: 97.6 F (36.4 C)  TempSrc: Tympanic  Resp: 16      Labs reviewed: Basic Metabolic Panel:  Recent Labs  02/10/14 05/04/14 07/26/14  NA 141 140 138  K 3.8 3.9 4.1  BUN 22* 23* 22*  CREATININE 1.2* 1.0 1.1  TSH 3.74  --   --     CBC:  Recent Labs  02/10/14  WBC 7.6  HGB 12.3  HCT 36  PLT 292   Assessment/Plan Unspecified  hypothyroidism Takes Synthroid 31mcg daily. TSH 2.900 04/05/13. 3.744 02/10/14    Unspecified constipation Stable on Senokot S I nightly  Insomnia stable with Mirtazapine 7.5mg  nightly     Hypertension Controlled on Losartan 100mg  and  Metoprolol 50mg  bid, monitor Bp daily     GERD (gastroesophageal reflux disease) Stable, takes Omeprazole 20mg  daily.    Edema Trace in ankles, takes Maxzide 37.5/25mg  3x/week. Bun/creat 22/1.13 07/26/14. Dry, warm, scaly, mild edema in ankles as prior-continue to monitor "cold and edmematous BLE"    Depression stable with Mirtazapine 7.5mg  nightly     Alzheimer's disease Baseline mentation now and no change since off  Namenda and Aricept 06/13/13.  MMSE 11/30 03/21/13. Observe the patient. Etiology of her confusion.       Family/ Staff Communication: observe the patient  Goals of Care: SNF  Labs/tests ordered: none

## 2014-09-06 NOTE — Assessment & Plan Note (Signed)
Controlled on Losartan 100mg and  Metoprolol 50mg bid, monitor Bp daily  

## 2014-09-06 NOTE — Assessment & Plan Note (Signed)
Baseline mentation now and no change since off  Namenda and Aricept 06/13/13.  MMSE 11/30 03/21/13. Observe the patient. Etiology of her confusion.    

## 2014-09-06 NOTE — Assessment & Plan Note (Signed)
Stable on Senokot S I nightly  

## 2014-09-06 NOTE — Assessment & Plan Note (Signed)
stable with Mirtazapine 7.5mg  nightly

## 2014-09-06 NOTE — Assessment & Plan Note (Signed)
Stable, takes Omeprazole 20mg daily.  

## 2014-09-06 NOTE — Assessment & Plan Note (Signed)
Trace in ankles, takes Maxzide 37.5/25mg  3x/week. Bun/creat 22/1.13 07/26/14. Dry, warm, scaly, mild edema in ankles as prior-continue to monitor "cold and edmematous BLE"

## 2014-09-06 NOTE — Assessment & Plan Note (Signed)
Takes Synthroid 34mcg daily. TSH 2.900 04/05/13. 3.744 02/10/14

## 2014-10-12 ENCOUNTER — Encounter: Payer: Self-pay | Admitting: Nurse Practitioner

## 2014-10-12 ENCOUNTER — Non-Acute Institutional Stay (SKILLED_NURSING_FACILITY): Payer: Medicare Other | Admitting: Nurse Practitioner

## 2014-10-12 DIAGNOSIS — E039 Hypothyroidism, unspecified: Secondary | ICD-10-CM

## 2014-10-12 DIAGNOSIS — R609 Edema, unspecified: Secondary | ICD-10-CM

## 2014-10-12 DIAGNOSIS — I10 Essential (primary) hypertension: Secondary | ICD-10-CM

## 2014-10-12 DIAGNOSIS — K219 Gastro-esophageal reflux disease without esophagitis: Secondary | ICD-10-CM

## 2014-10-12 DIAGNOSIS — G4700 Insomnia, unspecified: Secondary | ICD-10-CM

## 2014-10-12 DIAGNOSIS — K5900 Constipation, unspecified: Secondary | ICD-10-CM

## 2014-10-12 DIAGNOSIS — F32A Depression, unspecified: Secondary | ICD-10-CM

## 2014-10-12 DIAGNOSIS — F0280 Dementia in other diseases classified elsewhere without behavioral disturbance: Secondary | ICD-10-CM

## 2014-10-12 DIAGNOSIS — G309 Alzheimer's disease, unspecified: Secondary | ICD-10-CM

## 2014-10-12 DIAGNOSIS — F329 Major depressive disorder, single episode, unspecified: Secondary | ICD-10-CM

## 2014-10-12 NOTE — Assessment & Plan Note (Signed)
Takes Synthroid 54mcg daily. TSH 2.900 04/05/13. 3.744 02/10/14.

## 2014-10-12 NOTE — Assessment & Plan Note (Signed)
Stable since off Mirtazapine 09/21/14

## 2014-10-12 NOTE — Assessment & Plan Note (Signed)
Controlled on Losartan 100mg and  Metoprolol 50mg bid, monitor Bp daily  

## 2014-10-12 NOTE — Assessment & Plan Note (Signed)
Baseline mentation now and no change since off  Namenda and Aricept 06/13/13.  MMSE 11/30 03/21/13. Observe the patient. Etiology of her confusion.    

## 2014-10-12 NOTE — Assessment & Plan Note (Signed)
Worse from trace to 1+ in ankles, takes Maxzide 37.5/25mg  3x/week. No noted SOB, cough, sputum production, or increased moist lung rales. Continue to observe

## 2014-10-12 NOTE — Assessment & Plan Note (Signed)
Stable, off Mirtazapine 7.5mg  nightly

## 2014-10-12 NOTE — Assessment & Plan Note (Signed)
Stable on Senokot S I nightly  

## 2014-10-12 NOTE — Assessment & Plan Note (Signed)
Stable, takes Omeprazole 20mg daily.  

## 2014-10-12 NOTE — Progress Notes (Signed)
Patient ID: [name], female   DOB: 1913/04/14, 80 y.o.   MRN: [id number]   Code Status: DNR  No Known Allergies  Chief Complaint  Patient presents with  . Medical Management of Chronic Issues  . Acute Visit    edema    HPI: Patient is a 80 y.o. female seen in the SNF at [facility] today for evaluation of chronic medical conditions.  Problem List Items Addressed This Visit   Insomnia     Stable, off Mirtazapine 7.5mg  nightly      Hypothyroidism     Takes Synthroid 39mcg daily. TSH 2.900 04/05/13. 3.744 02/10/14.        Hypertension     Controlled on Losartan 100mg  and  Metoprolol 50mg  bid, monitor Bp daily       GERD (gastroesophageal reflux disease)     Stable, takes Omeprazole 20mg  daily.      Edema (Chronic)     Worse from trace to 1+ in ankles, takes Maxzide 37.5/25mg  3x/week. No noted SOB, cough, sputum production, or increased moist lung rales. Continue to observe      Depression - Primary     Stable since off Mirtazapine 09/21/14    Constipation     Stable on Senokot S I nightly     Alzheimer's disease     Baseline mentation now and no change since off  Namenda and Aricept 06/13/13.  MMSE 11/30 03/21/13. Observe the patient. Etiology of her confusion.          Review of System:Review of Systems  Constitutional: Negative for fever, chills, weight loss, malaise/fatigue and diaphoresis.  HENT: Positive for hearing loss. Negative for congestion, ear discharge, ear pain, nosebleeds, sore throat and tinnitus.   Eyes: Negative for blurred vision, double vision, photophobia, pain, discharge and redness.  Respiratory: Negative for cough, hemoptysis, sputum production, shortness of breath, wheezing and stridor.   Cardiovascular: Positive for leg swelling. Negative for chest pain, palpitations, orthopnea, claudication and PND.       1+ BLE  Gastrointestinal: Negative for heartburn, nausea, vomiting, abdominal pain, diarrhea, constipation, blood in  stool and melena.  Genitourinary: Positive for frequency. Negative for dysuria, urgency, hematuria and flank pain.  Musculoskeletal: Negative for back pain, joint pain, myalgias and neck pain.  Skin: Negative for itching and rash.       BLE warmth to touch.   Neurological: Negative for dizziness, tingling, tremors, sensory change, speech change, focal weakness, seizures, loss of consciousness, weakness and headaches.  Endo/Heme/Allergies: Negative for environmental allergies and polydipsia. Does not bruise/bleed easily.  Psychiatric/Behavioral: Positive for memory loss. Negative for depression, suicidal ideas, hallucinations and substance abuse. The patient is not nervous/anxious and does not have insomnia.      Past Medical History  Diagnosis Date  . Spasm of muscle 11/2011  . Thyroid disease   . Anemia of other chronic disease 2012  . Anxiety   . Peripheral vascular disease, unspecified 2011  . Reflux esophagitis 2011  . Vomiting alone 2011  . Benign neoplasm of colon 2011  . Type II or unspecified type diabetes mellitus without mention of complication, not stated as uncontrolled 2011  . Unspecified vitamin D deficiency 2011  . Hyperlipidemia   . Depression   . Alzheimer's disease 2011  . Unspecified hearing loss 2011  . Stricture and stenosis of esophagus 2011  . Diaphragmatic hernia without mention of obstruction or gangrene 2011  . Diverticulosis of colon (without mention of hemorrhage) 2011  . Lumbago  2011  . Dizziness and giddiness 2011  . Other malaise and fatigue 2011  . Abnormality of gait 2011  . Senile dementia, uncomplicated 0998  . Hypertension    Past Surgical History  Procedure Laterality Date  . Tonsillectomy  1923  . Cataract extraction w/ intraocular lens  implant, bilateral  1985  . Retinal detachment surgery    . Esophageal dilation  2011  . Breast cyst aspiration     Social History:   reports that she has never smoked. She has never used smokeless  tobacco. She reports that she does not drink alcohol or use illicit drugs.  Medications: Patient's Medications  New Prescriptions   No medications on file  Previous Medications   ACETAMINOPHEN (TYLENOL) 325 MG TABLET    Take 650 mg by mouth every 6 (six) hours as needed for mild pain or moderate pain.   ASPIRIN 81 MG TABLET    Take 81 mg by mouth daily.   CALCIUM CARBONATE-VITAMIN D (CALCIUM 500 + D) 500-125 MG-UNIT TABS    Take 1 tablet by mouth. Take one daily   CARBOXYMETHYLCELLUL-GLYCERIN (OPTIVE) 0.5-0.9 % SOLN    Apply 1 drop to eye 4 (four) times daily. Instill 1 drop in both eyes four times daily. *Wait 3-5 minutes between eye drops.*   LEVOTHYROXINE (SYNTHROID, LEVOTHROID) 25 MCG TABLET    Take 25 mcg by mouth daily before breakfast. *Check pulse weekly on Saturday.*   LOSARTAN (COZAAR) 100 MG TABLET    Take 100 mg by mouth daily.   METOPROLOL (LOPRESSOR) 50 MG TABLET    Take 50 mg by mouth 2 (two) times daily. For blood pressure   MULTIPLE VITAMINS-MINERALS (CERTAVITE SENIOR/ANTIOXIDANT PO)    Take 1 tablet by mouth. Take one daily   NAPROXEN SODIUM (ANAPROX) 220 MG TABLET    Take 220 mg by mouth daily. For back pain.   OMEPRAZOLE (PRILOSEC) 20 MG CAPSULE    Take 20 mg by mouth daily. *Do Not Crush* Take on a empty stomach.   SENNOSIDES-DOCUSATE SODIUM (SENOKOT-S) 8.6-50 MG TABLET    Take 1 tablet by mouth at bedtime.   TRIAMTERENE-HYDROCHLOROTHIAZIDE (MAXZIDE-25) 37.5-25 MG PER TABLET    Take 1 tablet by mouth on Mon, Wed, and Fri by mouth.  Modified Medications   No medications on file  Discontinued Medications   MIRTAZAPINE (REMERON) 7.5 MG TABLET    Take 7.5 mg by mouth at bedtime.     Physical Exam: Physical Exam  Constitutional: She is oriented to person, place, and time. She appears well-developed and well-nourished. No distress.  HENT:  Head: Normocephalic and atraumatic.  Right Ear: External ear normal.  Left Ear: External ear normal.  Nose: Nose normal.    Mouth/Throat: Oropharynx is clear and moist. No oropharyngeal exudate.  Eyes: Conjunctivae and EOM are normal. Pupils are equal, round, and reactive to light. Right eye exhibits no discharge. Left eye exhibits no discharge. No scleral icterus.  Neck: Normal range of motion. Neck supple. No JVD present. No tracheal deviation present. No thyromegaly present.  Cardiovascular: Normal rate, regular rhythm and normal heart sounds.  Exam reveals no friction rub.   No murmur heard. Pulmonary/Chest: Effort normal. No stridor. No respiratory distress. She has no wheezes. She has rales. She exhibits no tenderness.  Bibasilar   Abdominal: Soft. Bowel sounds are normal. She exhibits no distension. There is no tenderness. There is no rebound and no guarding.  Musculoskeletal: Normal range of motion. She exhibits edema. She exhibits no tenderness.  1+ BLE. Ambulates with walker.   Lymphadenopathy:    She has no cervical adenopathy.  Neurological: She is alert and oriented to person, place, and time. She has normal reflexes. No cranial nerve deficit. She exhibits normal muscle tone. Coordination normal.  Skin: Skin is warm and dry. No rash noted. She is not diaphoretic. No erythema. No pallor.  Dry scaly warm BLE  Psychiatric: She has a normal mood and affect. Her behavior is normal. Thought content normal. Cognition and memory are impaired. She expresses impulsivity and inappropriate judgment. She exhibits abnormal recent memory.    Filed Vitals:   10/12/14 1502  BP: 122/64  Pulse: 72  Temp: 98.3 F (36.8 C)  TempSrc: Tympanic  Resp: 18      Labs reviewed: Basic Metabolic Panel:  Recent Labs  02/10/14 05/04/14 07/26/14  NA 141 140 138  K 3.8 3.9 4.1  BUN 22* 23* 22*  CREATININE 1.2* 1.0 1.1  TSH 3.74  --   --     CBC:  Recent Labs  02/10/14  WBC 7.6  HGB 12.3  HCT 36  PLT 292   Assessment/Plan Depression Stable since off Mirtazapine 09/21/14  Edema Worse from trace to 1+ in  ankles, takes Maxzide 37.5/25mg  3x/week. No noted SOB, cough, sputum production, or increased moist lung rales. Continue to observe    Hypothyroidism Takes Synthroid 78mcg daily. TSH 2.900 04/05/13. 3.744 02/10/14.      Hypertension Controlled on Losartan 100mg  and  Metoprolol 50mg  bid, monitor Bp daily     GERD (gastroesophageal reflux disease) Stable, takes Omeprazole 20mg  daily.    Constipation Stable on Senokot S I nightly   Alzheimer's disease Baseline mentation now and no change since off  Namenda and Aricept 06/13/13.  MMSE 11/30 03/21/13. Observe the patient. Etiology of her confusion.     Insomnia Stable, off Mirtazapine 7.5mg  nightly      Family/ Staff Communication: observe the patient  Goals of Care: SNF  Labs/tests ordered: none

## 2014-10-27 LAB — BASIC METABOLIC PANEL
BUN: 33 mg/dL — AB (ref 4–21)
Creatinine: 1.1 mg/dL (ref 0.5–1.1)
Glucose: 113 mg/dL
Potassium: 4.6 mmol/L (ref 3.4–5.3)
SODIUM: 131 mmol/L — AB (ref 137–147)

## 2014-10-30 ENCOUNTER — Encounter: Payer: Self-pay | Admitting: Nurse Practitioner

## 2014-10-30 ENCOUNTER — Non-Acute Institutional Stay (SKILLED_NURSING_FACILITY): Payer: Medicare Other | Admitting: Nurse Practitioner

## 2014-10-30 DIAGNOSIS — G4700 Insomnia, unspecified: Secondary | ICD-10-CM

## 2014-10-30 DIAGNOSIS — F32A Depression, unspecified: Secondary | ICD-10-CM

## 2014-10-30 DIAGNOSIS — F329 Major depressive disorder, single episode, unspecified: Secondary | ICD-10-CM

## 2014-10-30 DIAGNOSIS — F0280 Dementia in other diseases classified elsewhere without behavioral disturbance: Secondary | ICD-10-CM

## 2014-10-30 DIAGNOSIS — I10 Essential (primary) hypertension: Secondary | ICD-10-CM

## 2014-10-30 DIAGNOSIS — E039 Hypothyroidism, unspecified: Secondary | ICD-10-CM

## 2014-10-30 DIAGNOSIS — R609 Edema, unspecified: Secondary | ICD-10-CM

## 2014-10-30 DIAGNOSIS — K219 Gastro-esophageal reflux disease without esophagitis: Secondary | ICD-10-CM

## 2014-10-30 DIAGNOSIS — G309 Alzheimer's disease, unspecified: Secondary | ICD-10-CM

## 2014-10-30 DIAGNOSIS — K5900 Constipation, unspecified: Secondary | ICD-10-CM

## 2014-10-30 DIAGNOSIS — E871 Hypo-osmolality and hyponatremia: Secondary | ICD-10-CM | POA: Insufficient documentation

## 2014-10-30 NOTE — Assessment & Plan Note (Signed)
Stable since off Mirtazapine 09/21/14

## 2014-10-30 NOTE — Assessment & Plan Note (Addendum)
Worse from previous trace to 1+ to 1-2+ today. Weight gained 3-4 Ib/a week so. No new O2 desaturation, SOB, cough, or sputum production.  BNP 36.0 10/27/14. Will increase Triamterene from 3x/wk to 5x/wk. Update BMP 10/31/14

## 2014-10-30 NOTE — Addendum Note (Signed)
Addended by: MAST, MAN X on: 10/30/2014 04:09 PM   Modules accepted: Level of Service

## 2014-10-30 NOTE — Assessment & Plan Note (Signed)
Controlled on Losartan 100mg and  Metoprolol 50mg bid, monitor Bp daily  

## 2014-10-30 NOTE — Assessment & Plan Note (Signed)
Stable on Senokot S I nightly  

## 2014-10-30 NOTE — Assessment & Plan Note (Signed)
Baseline mentation now and no change since off  Namenda and Aricept 06/13/13.  MMSE 11/30 03/21/13. Observe the patient. Etiology of her confusion.    

## 2014-10-30 NOTE — Assessment & Plan Note (Signed)
Dropped Na from 138 previously to 131 10/27/14 in setting to increased BLE edema. Will increase Triamterene from 3x/wk to 5x/wk. Observe the patient. F/u BMP. BNP 36.0 10/27/14

## 2014-10-30 NOTE — Assessment & Plan Note (Signed)
Takes Synthroid 47mcg daily. TSH 2.900 04/05/13. 3.744 02/10/14. Update TSH

## 2014-10-30 NOTE — Assessment & Plan Note (Signed)
Stable, takes Omeprazole 20mg daily.  

## 2014-10-30 NOTE — Progress Notes (Signed)
Patient ID: [name], female   DOB: 08/16/1913, 80 y.o.   MRN: [id number]   Code Status: DNR  No Known Allergies  Chief Complaint  Patient presents with  . Medical Management of Chronic Issues  . Acute Visit    Na 131, BLE edema.     HPI: Patient is a 80 y.o. female seen in the SNF at [facility] today for evaluation of hyponatremia, BLE edema, and chronic medical conditions.  Problem List Items Addressed This Visit    Insomnia    Stable, off Mirtazapine 7.5mg  nightly       Hypothyroidism    Takes Synthroid 65mcg daily. TSH 2.900 04/05/13. 3.744 02/10/14. Update TSH       Hyponatremia - Primary    Dropped Na from 138 previously to 131 10/27/14 in setting to increased BLE edema. Will increase Triamterene from 3x/wk to 5x/wk. Observe the patient. F/u BMP. BNP 36.0 10/27/14    Hypertension    Controlled on Losartan 100mg  and  Metoprolol 50mg  bid, monitor Bp daily     GERD (gastroesophageal reflux disease)    Stable, takes Omeprazole 20mg  daily.       Edema (Chronic)    Worse from previous trace to 1+ to 1-2+ today. Weight gained 3-4 Ib/a week so. No new O2 desaturation, SOB, cough, or sputum production.  BNP 36.0 10/27/14. Will increase Triamterene from 3x/wk to 5x/wk. Update BMP 10/31/14    Depression    Stable since off Mirtazapine 09/21/14     Constipation    Stable on Senokot S I nightly      Alzheimer's disease    Baseline mentation now and no change since off  Namenda and Aricept 06/13/13.  MMSE 11/30 03/21/13. Observe the patient. Etiology of her confusion.           Review of System:Review of Systems  Constitutional: Negative for fever, chills, weight loss, malaise/fatigue and diaphoresis.       Weight gain 3-4 Ib/ a week.   HENT: Positive for hearing loss. Negative for congestion, ear discharge, ear pain, nosebleeds, sore throat and tinnitus.   Eyes: Negative for blurred vision, double vision, photophobia, pain, discharge and redness.    Respiratory: Negative for cough, hemoptysis, sputum production, shortness of breath, wheezing and stridor.   Cardiovascular: Positive for leg swelling. Negative for chest pain, palpitations, orthopnea, claudication and PND.       1+ BLE  Gastrointestinal: Negative for heartburn, nausea, vomiting, abdominal pain, diarrhea, constipation, blood in stool and melena.  Genitourinary: Positive for frequency. Negative for dysuria, urgency, hematuria and flank pain.  Musculoskeletal: Negative for myalgias, back pain, joint pain and neck pain.  Skin: Negative for itching and rash.       BLE warmth to touch.   Neurological: Negative for dizziness, tingling, tremors, sensory change, speech change, focal weakness, seizures, loss of consciousness, weakness and headaches.  Endo/Heme/Allergies: Negative for environmental allergies and polydipsia. Does not bruise/bleed easily.  Psychiatric/Behavioral: Positive for memory loss. Negative for depression, suicidal ideas, hallucinations and substance abuse. The patient is not nervous/anxious and does not have insomnia.      Past Medical History  Diagnosis Date  . Spasm of muscle 11/2011  . Thyroid disease   . Anemia of other chronic disease 2012  . Anxiety   . Peripheral vascular disease, unspecified 2011  . Reflux esophagitis 2011  . Vomiting alone 2011  . Benign neoplasm of colon 2011  . Type II or unspecified type diabetes mellitus without  mention of complication, not stated as uncontrolled 2011  . Unspecified vitamin D deficiency 2011  . Hyperlipidemia   . Depression   . Alzheimer's disease 2011  . Unspecified hearing loss 2011  . Stricture and stenosis of esophagus 2011  . Diaphragmatic hernia without mention of obstruction or gangrene 2011  . Diverticulosis of colon (without mention of hemorrhage) 2011  . Lumbago 2011  . Dizziness and giddiness 2011  . Other malaise and fatigue 2011  . Abnormality of gait 2011  . Senile dementia, uncomplicated  4332  . Hypertension    Past Surgical History  Procedure Laterality Date  . Tonsillectomy  1923  . Cataract extraction w/ intraocular lens  implant, bilateral  1985  . Retinal detachment surgery    . Esophageal dilation  2011  . Breast cyst aspiration     Social History:   reports that she has never smoked. She has never used smokeless tobacco. She reports that she does not drink alcohol or use illicit drugs.  Medications: Patient's Medications  New Prescriptions   No medications on file  Previous Medications   ACETAMINOPHEN (TYLENOL) 325 MG TABLET    Take 650 mg by mouth every 6 (six) hours as needed for mild pain or moderate pain.   ASPIRIN 81 MG TABLET    Take 81 mg by mouth daily.   CALCIUM CARBONATE-VITAMIN D (CALCIUM 500 + D) 500-125 MG-UNIT TABS    Take 1 tablet by mouth. Take one daily   CARBOXYMETHYLCELLUL-GLYCERIN (OPTIVE) 0.5-0.9 % SOLN    Apply 1 drop to eye 4 (four) times daily. Instill 1 drop in both eyes four times daily. *Wait 3-5 minutes between eye drops.*   LEVOTHYROXINE (SYNTHROID, LEVOTHROID) 25 MCG TABLET    Take 25 mcg by mouth daily before breakfast. *Check pulse weekly on Saturday.*   LOSARTAN (COZAAR) 100 MG TABLET    Take 100 mg by mouth daily.   METOPROLOL (LOPRESSOR) 50 MG TABLET    Take 50 mg by mouth 2 (two) times daily. For blood pressure   MULTIPLE VITAMINS-MINERALS (CERTAVITE SENIOR/ANTIOXIDANT PO)    Take 1 tablet by mouth. Take one daily   NAPROXEN SODIUM (ANAPROX) 220 MG TABLET    Take 220 mg by mouth daily. For back pain.   OMEPRAZOLE (PRILOSEC) 20 MG CAPSULE    Take 20 mg by mouth daily. *Do Not Crush* Take on a empty stomach.   SENNOSIDES-DOCUSATE SODIUM (SENOKOT-S) 8.6-50 MG TABLET    Take 1 tablet by mouth at bedtime.   TRIAMTERENE-HYDROCHLOROTHIAZIDE (MAXZIDE-25) 37.5-25 MG PER TABLET    Take 1 tablet by mouth M-F by mouth.  Modified Medications   No medications on file  Discontinued Medications   No medications on file     Physical  Exam: Physical Exam  Constitutional: She is oriented to person, place, and time. She appears well-developed and well-nourished. No distress.  HENT:  Head: Normocephalic and atraumatic.  Right Ear: External ear normal.  Left Ear: External ear normal.  Nose: Nose normal.  Mouth/Throat: Oropharynx is clear and moist. No oropharyngeal exudate.  Eyes: Conjunctivae and EOM are normal. Pupils are equal, round, and reactive to light. Right eye exhibits no discharge. Left eye exhibits no discharge. No scleral icterus.  Neck: Normal range of motion. Neck supple. No JVD present. No tracheal deviation present. No thyromegaly present.  Cardiovascular: Normal rate, regular rhythm and normal heart sounds.  Exam reveals no friction rub.   No murmur heard. Pulmonary/Chest: Effort normal. No stridor. No respiratory  distress. She has no wheezes. She has rales. She exhibits no tenderness.  Bibasilar   Abdominal: Soft. Bowel sounds are normal. She exhibits no distension. There is no tenderness. There is no rebound and no guarding.  Musculoskeletal: Normal range of motion. She exhibits edema. She exhibits no tenderness.  1+ BLE. Ambulates with walker.   Lymphadenopathy:    She has no cervical adenopathy.  Neurological: She is alert and oriented to person, place, and time. She has normal reflexes. No cranial nerve deficit. She exhibits normal muscle tone. Coordination normal.  Skin: Skin is warm and dry. No rash noted. She is not diaphoretic. No erythema. No pallor.  Dry scaly warm BLE  Psychiatric: She has a normal mood and affect. Her behavior is normal. Thought content normal. Cognition and memory are impaired. She expresses impulsivity and inappropriate judgment. She exhibits abnormal recent memory.    Filed Vitals:   10/30/14 1357  BP: 130/70  Pulse: 68  Temp: 97 F (36.1 C)  TempSrc: Tympanic  Resp: 18      Labs reviewed: Basic Metabolic Panel:  Recent Labs  02/10/14 05/04/14 07/26/14  10/27/14  NA 141 140 138 131*  K 3.8 3.9 4.1 4.6  BUN 22* 23* 22* 33*  CREATININE 1.2* 1.0 1.1 1.1  TSH 3.74  --   --   --     CBC:  Recent Labs  02/10/14  WBC 7.6  HGB 12.3  HCT 36  PLT 292   Assessment/Plan Hyponatremia Dropped Na from 138 previously to 131 10/27/14 in setting to increased BLE edema. Will increase Triamterene from 3x/wk to 5x/wk. Observe the patient. F/u BMP. BNP 36.0 10/27/14  Edema Worse from previous trace to 1+ to 1-2+ today. Weight gained 3-4 Ib/a week so. No new O2 desaturation, SOB, cough, or sputum production.  BNP 36.0 10/27/14. Will increase Triamterene from 3x/wk to 5x/wk. Update BMP 10/31/14  Insomnia Stable, off Mirtazapine 7.5mg  nightly     Hypothyroidism Takes Synthroid 78mcg daily. TSH 2.900 04/05/13. 3.744 02/10/14. Update TSH     Hypertension Controlled on Losartan 100mg  and  Metoprolol 50mg  bid, monitor Bp daily   GERD (gastroesophageal reflux disease) Stable, takes Omeprazole 20mg  daily.     Depression Stable since off Mirtazapine 09/21/14   Constipation Stable on Senokot S I nightly    Alzheimer's disease Baseline mentation now and no change since off  Namenda and Aricept 06/13/13.  MMSE 11/30 03/21/13. Observe the patient. Etiology of her confusion.        Family/ Staff Communication: observe the patient  Goals of Care: SNF  Labs/tests ordered: BMP and TSH 10/31/14

## 2014-10-30 NOTE — Assessment & Plan Note (Signed)
Stable, off Mirtazapine 7.5mg  nightly

## 2014-11-01 ENCOUNTER — Other Ambulatory Visit: Payer: Self-pay | Admitting: Nurse Practitioner

## 2014-11-01 DIAGNOSIS — E871 Hypo-osmolality and hyponatremia: Secondary | ICD-10-CM

## 2014-11-01 DIAGNOSIS — E039 Hypothyroidism, unspecified: Secondary | ICD-10-CM

## 2014-11-01 LAB — TSH: TSH: 3.02 u[IU]/mL (ref 0.41–5.90)

## 2014-11-01 LAB — BASIC METABOLIC PANEL
BUN: 24 mg/dL — AB (ref 4–21)
CREATININE: 1.1 mg/dL (ref 0.5–1.1)
Glucose: 155 mg/dL
Potassium: 4.1 mmol/L (ref 3.4–5.3)
SODIUM: 134 mmol/L — AB (ref 137–147)

## 2014-11-22 ENCOUNTER — Non-Acute Institutional Stay (SKILLED_NURSING_FACILITY): Payer: Medicare Other | Admitting: Nurse Practitioner

## 2014-11-22 ENCOUNTER — Encounter: Payer: Self-pay | Admitting: Nurse Practitioner

## 2014-11-22 DIAGNOSIS — K5900 Constipation, unspecified: Secondary | ICD-10-CM

## 2014-11-22 DIAGNOSIS — G309 Alzheimer's disease, unspecified: Secondary | ICD-10-CM

## 2014-11-22 DIAGNOSIS — F329 Major depressive disorder, single episode, unspecified: Secondary | ICD-10-CM

## 2014-11-22 DIAGNOSIS — E871 Hypo-osmolality and hyponatremia: Secondary | ICD-10-CM

## 2014-11-22 DIAGNOSIS — E039 Hypothyroidism, unspecified: Secondary | ICD-10-CM

## 2014-11-22 DIAGNOSIS — M546 Pain in thoracic spine: Secondary | ICD-10-CM

## 2014-11-22 DIAGNOSIS — I10 Essential (primary) hypertension: Secondary | ICD-10-CM

## 2014-11-22 DIAGNOSIS — R609 Edema, unspecified: Secondary | ICD-10-CM

## 2014-11-22 DIAGNOSIS — F0280 Dementia in other diseases classified elsewhere without behavioral disturbance: Secondary | ICD-10-CM

## 2014-11-22 DIAGNOSIS — G4700 Insomnia, unspecified: Secondary | ICD-10-CM

## 2014-11-22 DIAGNOSIS — K219 Gastro-esophageal reflux disease without esophagitis: Secondary | ICD-10-CM

## 2014-11-22 DIAGNOSIS — F32A Depression, unspecified: Secondary | ICD-10-CM

## 2014-11-22 NOTE — Assessment & Plan Note (Signed)
Baseline mentation now and no change since off  Namenda and Aricept 06/13/13.  MMSE 11/30 03/21/13. Observe the patient. Etiology of her confusion.    

## 2014-11-22 NOTE — Assessment & Plan Note (Signed)
Dropped Na from 138 previously to 131 10/27/14 in setting to increased BLE edema.  BNP 36.0 10/27/14 11/01/14 Na 134 11/22/14 continue Maxide 5x/week.

## 2014-11-22 NOTE — Assessment & Plan Note (Signed)
Stable since off Mirtazapine 09/21/14

## 2014-11-22 NOTE — Progress Notes (Signed)
Patient ID: [name], female   DOB: 1913-08-23, 80 y.o.   MRN: [id number]   Code Status: DNR  No Known Allergies  Chief Complaint  Patient presents with  . Medical Management of Chronic Issues  . Acute Visit    BLE edema.     HPI: Patient is a 80 y.o. female seen in the SNF at [facility] today for evaluation of BLE edema, and chronic medical conditions.  Problem List Items Addressed This Visit    Insomnia    Stable, off Mirtazapine 7.5mg  nightly        Hypothyroidism    Takes Synthroid 1mcg daily. TSH 2.900 04/05/13. 3.744 02/10/14. 3.025 11/01/14    Hyponatremia    Dropped Na from 138 previously to 131 10/27/14 in setting to increased BLE edema.  BNP 36.0 10/27/14 11/01/14 Na 134 11/22/14 continue Maxide 5x/week.      Hypertension    Controlled on Losartan 100mg  and  Metoprolol 50mg  bid, monitor Bp daily      GERD (gastroesophageal reflux disease)    Stable, takes Omeprazole 20mg  daily.      Edema - Primary (Chronic)    Worse from previous trace to 1+ to 1-2+ today. Weight gained 3-4 Ib/a week so. No new O2 desaturation, SOB, cough, or sputum production.  BNP 36.0 10/27/14. Will increase Triamterene from 3x/wk to 5x/wk.  11/22/14 persisted BLE edema and increased Maxide since 10/30/14-will dc Aleve 220mg  daily for now. Continue to observe the patient for s/s of developing CHF.      Depression    Stable since off Mirtazapine 09/21/14      Constipation    Stable on Senokot S I nightly      Back pain, thoracic    Multiple sites: OA in nature, continue Ibuprofen 200mg  bid.     Alzheimer's disease    Baseline mentation now and no change since off  Namenda and Aricept 06/13/13.  MMSE 11/30 03/21/13. Observe the patient. Etiology of her confusion.          Review of System:Review of Systems  Constitutional: Negative for fever, chills, weight loss, malaise/fatigue and diaphoresis.       Weight gain 3-4 Ib/ a week.   HENT: Positive for hearing loss.  Negative for congestion, ear discharge, ear pain, nosebleeds, sore throat and tinnitus.   Eyes: Negative for blurred vision, double vision, photophobia, pain, discharge and redness.  Respiratory: Negative for cough, hemoptysis, sputum production, shortness of breath, wheezing and stridor.   Cardiovascular: Positive for leg swelling. Negative for chest pain, palpitations, orthopnea, claudication and PND.       1+ BLE  Gastrointestinal: Negative for heartburn, nausea, vomiting, abdominal pain, diarrhea, constipation, blood in stool and melena.  Genitourinary: Positive for frequency. Negative for dysuria, urgency, hematuria and flank pain.  Musculoskeletal: Negative for myalgias, back pain, joint pain and neck pain.  Skin: Negative for itching and rash.       BLE warmth to touch.   Neurological: Negative for dizziness, tingling, tremors, sensory change, speech change, focal weakness, seizures, loss of consciousness, weakness and headaches.  Endo/Heme/Allergies: Negative for environmental allergies and polydipsia. Does not bruise/bleed easily.  Psychiatric/Behavioral: Positive for memory loss. Negative for depression, suicidal ideas, hallucinations and substance abuse. The patient is not nervous/anxious and does not have insomnia.      Past Medical History  Diagnosis Date  . Spasm of muscle 11/2011  . Thyroid disease   . Anemia of other chronic disease 2012  .  Anxiety   . Peripheral vascular disease, unspecified 2011  . Reflux esophagitis 2011  . Vomiting alone 2011  . Benign neoplasm of colon 2011  . Type II or unspecified type diabetes mellitus without mention of complication, not stated as uncontrolled 2011  . Unspecified vitamin D deficiency 2011  . Hyperlipidemia   . Depression   . Alzheimer's disease 2011  . Unspecified hearing loss 2011  . Stricture and stenosis of esophagus 2011  . Diaphragmatic hernia without mention of obstruction or gangrene 2011  . Diverticulosis of colon  (without mention of hemorrhage) 2011  . Lumbago 2011  . Dizziness and giddiness 2011  . Other malaise and fatigue 2011  . Abnormality of gait 2011  . Senile dementia, uncomplicated 6712  . Hypertension    Past Surgical History  Procedure Laterality Date  . Tonsillectomy  1923  . Cataract extraction w/ intraocular lens  implant, bilateral  1985  . Retinal detachment surgery    . Esophageal dilation  2011  . Breast cyst aspiration     Social History:   reports that she has never smoked. She has never used smokeless tobacco. She reports that she does not drink alcohol or use illicit drugs.  Medications: Patient's Medications  New Prescriptions   No medications on file  Previous Medications   ACETAMINOPHEN (TYLENOL) 325 MG TABLET    Take 650 mg by mouth every 6 (six) hours as needed for mild pain or moderate pain.   ASPIRIN 81 MG TABLET    Take 81 mg by mouth daily.   CALCIUM CARBONATE-VITAMIN D (CALCIUM 500 + D) 500-125 MG-UNIT TABS    Take 1 tablet by mouth. Take one daily   CARBOXYMETHYLCELLUL-GLYCERIN (OPTIVE) 0.5-0.9 % SOLN    Apply 1 drop to eye 4 (four) times daily. Instill 1 drop in both eyes four times daily. *Wait 3-5 minutes between eye drops.*   LEVOTHYROXINE (SYNTHROID, LEVOTHROID) 25 MCG TABLET    Take 25 mcg by mouth daily before breakfast. *Check pulse weekly on Saturday.*   LOSARTAN (COZAAR) 100 MG TABLET    Take 100 mg by mouth daily.   METOPROLOL (LOPRESSOR) 50 MG TABLET    Take 50 mg by mouth 2 (two) times daily. For blood pressure   MULTIPLE VITAMINS-MINERALS (CERTAVITE SENIOR/ANTIOXIDANT PO)    Take 1 tablet by mouth. Take one daily   OMEPRAZOLE (PRILOSEC) 20 MG CAPSULE    Take 20 mg by mouth daily. *Do Not Crush* Take on a empty stomach.   SENNOSIDES-DOCUSATE SODIUM (SENOKOT-S) 8.6-50 MG TABLET    Take 1 tablet by mouth at bedtime.   TRIAMTERENE-HYDROCHLOROTHIAZIDE (MAXZIDE-25) 37.5-25 MG PER TABLET    Take 1 tablet by mouth M-F by mouth.  Modified Medications    No medications on file  Discontinued Medications   NAPROXEN SODIUM (ANAPROX) 220 MG TABLET    Take 220 mg by mouth daily. For back pain.     Physical Exam: Physical Exam  Constitutional: She is oriented to person, place, and time. She appears well-developed and well-nourished. No distress.  HENT:  Head: Normocephalic and atraumatic.  Right Ear: External ear normal.  Left Ear: External ear normal.  Nose: Nose normal.  Mouth/Throat: Oropharynx is clear and moist. No oropharyngeal exudate.  Eyes: Conjunctivae and EOM are normal. Pupils are equal, round, and reactive to light. Right eye exhibits no discharge. Left eye exhibits no discharge. No scleral icterus.  Neck: Normal range of motion. Neck supple. No JVD present. No tracheal deviation present. No  thyromegaly present.  Cardiovascular: Normal rate, regular rhythm and normal heart sounds.  Exam reveals no friction rub.   No murmur heard. Pulmonary/Chest: Effort normal. No stridor. No respiratory distress. She has no wheezes. She has rales. She exhibits no tenderness.  Bibasilar   Abdominal: Soft. Bowel sounds are normal. She exhibits no distension. There is no tenderness. There is no rebound and no guarding.  Musculoskeletal: Normal range of motion. She exhibits edema. She exhibits no tenderness.  1+ BLE. Ambulates with walker.   Lymphadenopathy:    She has no cervical adenopathy.  Neurological: She is alert and oriented to person, place, and time. She has normal reflexes. No cranial nerve deficit. She exhibits normal muscle tone. Coordination normal.  Skin: Skin is warm and dry. No rash noted. She is not diaphoretic. No erythema. No pallor.  Dry scaly warm BLE  Psychiatric: She has a normal mood and affect. Her behavior is normal. Thought content normal. Cognition and memory are impaired. She expresses impulsivity and inappropriate judgment. She exhibits abnormal recent memory.    Filed Vitals:   11/22/14 1548  BP: 120/68  Pulse:  80  Temp: 97.5 F (36.4 C)  TempSrc: Tympanic  Resp: 20      Labs reviewed: Basic Metabolic Panel:  Recent Labs  02/10/14  07/26/14 10/27/14 11/01/14  NA 141  < > 138 131* 134*  K 3.8  < > 4.1 4.6 4.1  BUN 22*  < > 22* 33* 24*  CREATININE 1.2*  < > 1.1 1.1 1.1  TSH 3.74  --   --   --  3.02  < > = values in this interval not displayed.  CBC:  Recent Labs  02/10/14  WBC 7.6  HGB 12.3  HCT 36  PLT 292   Assessment/Plan Alzheimer's disease Baseline mentation now and no change since off  Namenda and Aricept 06/13/13.  MMSE 11/30 03/21/13. Observe the patient. Etiology of her confusion.     Constipation Stable on Senokot S I nightly    Depression Stable since off Mirtazapine 09/21/14    Edema Worse from previous trace to 1+ to 1-2+ today. Weight gained 3-4 Ib/a week so. No new O2 desaturation, SOB, cough, or sputum production.  BNP 36.0 10/27/14. Will increase Triamterene from 3x/wk to 5x/wk.  11/22/14 persisted BLE edema and increased Maxide since 10/30/14-will dc Aleve 220mg  daily for now. Continue to observe the patient for s/s of developing CHF.    Back pain, thoracic Multiple sites: OA in nature, continue Ibuprofen 200mg  bid.   GERD (gastroesophageal reflux disease) Stable, takes Omeprazole 20mg  daily.    Hypertension Controlled on Losartan 100mg  and  Metoprolol 50mg  bid, monitor Bp daily    Hyponatremia Dropped Na from 138 previously to 131 10/27/14 in setting to increased BLE edema.  BNP 36.0 10/27/14 11/01/14 Na 134 11/22/14 continue Maxide 5x/week.    Hypothyroidism Takes Synthroid 39mcg daily. TSH 2.900 04/05/13. 3.744 02/10/14. 3.025 11/01/14  Insomnia Stable, off Mirtazapine 7.5mg  nightly        Family/ Staff Communication: observe the patient  Goals of Care: SNF  Labs/tests ordered: none

## 2014-11-22 NOTE — Assessment & Plan Note (Signed)
Stable, off Mirtazapine 7.5mg  nightly

## 2014-11-22 NOTE — Assessment & Plan Note (Signed)
Controlled on Losartan 100mg and  Metoprolol 50mg bid, monitor Bp daily  

## 2014-11-22 NOTE — Assessment & Plan Note (Signed)
Worse from previous trace to 1+ to 1-2+ today. Weight gained 3-4 Ib/a week so. No new O2 desaturation, SOB, cough, or sputum production.  BNP 36.0 10/27/14. Will increase Triamterene from 3x/wk to 5x/wk.  11/22/14 persisted BLE edema and increased Maxide since 10/30/14-will dc Aleve 220mg  daily for now. Continue to observe the patient for s/s of developing CHF.

## 2014-11-22 NOTE — Assessment & Plan Note (Signed)
Stable on Senokot S I nightly  

## 2014-11-22 NOTE — Assessment & Plan Note (Signed)
Takes Synthroid 25mcg daily. TSH 2.900 04/05/13. 3.744 02/10/14. 3.025 11/01/14  

## 2014-11-22 NOTE — Assessment & Plan Note (Signed)
Stable, takes Omeprazole 20mg daily.  

## 2014-11-22 NOTE — Assessment & Plan Note (Signed)
Multiple sites: OA in nature, continue Ibuprofen 200mg bid.  

## 2014-11-27 ENCOUNTER — Encounter: Payer: Self-pay | Admitting: Nurse Practitioner

## 2014-11-27 ENCOUNTER — Non-Acute Institutional Stay (SKILLED_NURSING_FACILITY): Payer: Medicare Other | Admitting: Nurse Practitioner

## 2014-11-27 DIAGNOSIS — R609 Edema, unspecified: Secondary | ICD-10-CM

## 2014-11-27 DIAGNOSIS — F329 Major depressive disorder, single episode, unspecified: Secondary | ICD-10-CM

## 2014-11-27 DIAGNOSIS — E871 Hypo-osmolality and hyponatremia: Secondary | ICD-10-CM

## 2014-11-27 DIAGNOSIS — G4700 Insomnia, unspecified: Secondary | ICD-10-CM

## 2014-11-27 DIAGNOSIS — K219 Gastro-esophageal reflux disease without esophagitis: Secondary | ICD-10-CM

## 2014-11-27 DIAGNOSIS — I10 Essential (primary) hypertension: Secondary | ICD-10-CM

## 2014-11-27 DIAGNOSIS — M546 Pain in thoracic spine: Secondary | ICD-10-CM

## 2014-11-27 DIAGNOSIS — E039 Hypothyroidism, unspecified: Secondary | ICD-10-CM

## 2014-11-27 DIAGNOSIS — S9032XA Contusion of left foot, initial encounter: Secondary | ICD-10-CM | POA: Insufficient documentation

## 2014-11-27 DIAGNOSIS — K5900 Constipation, unspecified: Secondary | ICD-10-CM

## 2014-11-27 DIAGNOSIS — F0280 Dementia in other diseases classified elsewhere without behavioral disturbance: Secondary | ICD-10-CM

## 2014-11-27 DIAGNOSIS — F32A Depression, unspecified: Secondary | ICD-10-CM

## 2014-11-27 DIAGNOSIS — G309 Alzheimer's disease, unspecified: Secondary | ICD-10-CM

## 2014-11-27 NOTE — Assessment & Plan Note (Signed)
Controlled on Losartan 100mg and  Metoprolol 50mg bid, monitor Bp daily  

## 2014-11-27 NOTE — Assessment & Plan Note (Signed)
Sustained from fall 11/25/14. No decreased ROM of left ankle and toes. Pain with weight bearing. Will X-ray 3 views of the left foot to rule out fxs.

## 2014-11-27 NOTE — Assessment & Plan Note (Signed)
off Mirtazapine 09/21/14 11/27/14 reportedly up and down and crying x1 night a few days ago.

## 2014-11-27 NOTE — Assessment & Plan Note (Signed)
Baseline mentation now and no change since off  Namenda and Aricept 06/13/13.  MMSE 11/30 03/21/13. Observe the patient. Etiology of her confusion.    

## 2014-11-27 NOTE — Assessment & Plan Note (Signed)
Stable on Senokot S I nightly  

## 2014-11-27 NOTE — Assessment & Plan Note (Signed)
Worse from previous trace to 1+ to 1-2+ today. Weight gained 3-4 Ib/a week so. No new O2 desaturation, SOB, cough, or sputum production.  BNP 36.0 10/27/14. Will increase Triamterene from 3x/wk to 5x/wk.  11/22/14 persisted BLE edema and increased Maxide since 10/30/14-will dc Aleve 220mg  daily for now. Continue to observe the patient for s/s of developing CHF.  11/27/14 compensated clinically.

## 2014-11-27 NOTE — Assessment & Plan Note (Signed)
Reported x1 up and down at night, off Mirtazapine 7.5mg nightly, continue to observe.    

## 2014-11-27 NOTE — Assessment & Plan Note (Signed)
Takes Synthroid 25mcg daily. TSH 2.900 04/05/13. 3.744 02/10/14. 3.025 11/01/14  

## 2014-11-27 NOTE — Assessment & Plan Note (Signed)
Dropped Na from 138 previously to 131 10/27/14 in setting to increased BLE edema.  BNP 36.0 10/27/14 11/01/14 Na 134 11/22/14 continue Maxide 5x/week.

## 2014-11-27 NOTE — Assessment & Plan Note (Signed)
Multiple sites: OA in nature, continue Ibuprofen 200mg bid.  

## 2014-11-27 NOTE — Assessment & Plan Note (Signed)
Stable, takes Omeprazole 20mg daily.  

## 2014-11-27 NOTE — Progress Notes (Signed)
Patient ID: [name], female   DOB: [birth year], 80 y.o.   MRN: [id number]   Code Status: DNR  No Known Allergies  Chief Complaint  Patient presents with  . Medical Management of Chronic Issues  . Acute Visit    left foot contusion.     HPI: Patient is a 80 y.o. female seen in the SNF at [facility] today for evaluation of left foot contusion,  BLE edema, and chronic medical conditions.  Problem List Items Addressed This Visit    Insomnia    Reported x1 up and down at night, off Mirtazapine 7.5mg  nightly, continue to observe.       Hypothyroidism    Takes Synthroid 82mcg daily. TSH 2.900 04/05/13. 3.744 02/10/14. 3.025 11/01/14     Hyponatremia    Dropped Na from 138 previously to 131 10/27/14 in setting to increased BLE edema.  BNP 36.0 10/27/14 11/01/14 Na 134 11/22/14 continue Maxide 5x/week.       Hypertension    Controlled on Losartan 100mg  and  Metoprolol 50mg  bid, monitor Bp daily       GERD (gastroesophageal reflux disease)    Stable, takes Omeprazole 20mg  daily.       Edema (Chronic)    Worse from previous trace to 1+ to 1-2+ today. Weight gained 3-4 Ib/a week so. No new O2 desaturation, SOB, cough, or sputum production.  BNP 36.0 10/27/14. Will increase Triamterene from 3x/wk to 5x/wk.  11/22/14 persisted BLE edema and increased Maxide since 10/30/14-will dc Aleve 220mg  daily for now. Continue to observe the patient for s/s of developing CHF.  11/27/14 compensated clinically.       Depression    off Mirtazapine 09/21/14 11/27/14 reportedly up and down and crying x1 night a few days ago.      Contusion of left foot - Primary    Sustained from fall 11/25/14. No decreased ROM of left ankle and toes. Pain with weight bearing. Will X-ray 3 views of the left foot to rule out fxs.     Constipation    Stable on Senokot S I nightly       Back pain, thoracic    Multiple sites: OA in nature, continue Ibuprofen 200mg  bid.      Alzheimer's  disease    Baseline mentation now and no change since off  Namenda and Aricept 06/13/13.  MMSE 11/30 03/21/13. Observe the patient. Etiology of her confusion.           Review of System:Review of Systems  Constitutional: Negative for fever, chills, weight loss, malaise/fatigue and diaphoresis.  HENT: Positive for hearing loss. Negative for congestion, ear discharge, ear pain, nosebleeds, sore throat and tinnitus.   Eyes: Negative for blurred vision, double vision, photophobia, pain, discharge and redness.  Respiratory: Negative for cough, hemoptysis, sputum production, shortness of breath, wheezing and stridor.   Cardiovascular: Positive for leg swelling. Negative for chest pain, palpitations, orthopnea, claudication and PND.       1+ Left foot, trace RLE  Gastrointestinal: Negative for heartburn, nausea, vomiting, abdominal pain, diarrhea, constipation, blood in stool and melena.  Genitourinary: Positive for frequency. Negative for dysuria, urgency, hematuria and flank pain.  Musculoskeletal: Positive for falls. Negative for myalgias, back pain, joint pain and neck pain.       11/25/14  Skin: Negative for itching and rash.       Lateral top of the left foot contusion sustained from fall 11/25/14   Neurological: Negative for dizziness, tingling, tremors,  sensory change, speech change, focal weakness, seizures, loss of consciousness, weakness and headaches.  Endo/Heme/Allergies: Negative for environmental allergies and polydipsia. Does not bruise/bleed easily.  Psychiatric/Behavioral: Positive for memory loss. Negative for depression, suicidal ideas, hallucinations and substance abuse. The patient is not nervous/anxious and does not have insomnia.      Past Medical History  Diagnosis Date  . Spasm of muscle 11/2011  . Thyroid disease   . Anemia of other chronic disease 2012  . Anxiety   . Peripheral vascular disease, unspecified 2011  . Reflux esophagitis 2011  . Vomiting alone 2011    . Benign neoplasm of colon 2011  . Type II or unspecified type diabetes mellitus without mention of complication, not stated as uncontrolled 2011  . Unspecified vitamin D deficiency 2011  . Hyperlipidemia   . Depression   . Alzheimer's disease 2011  . Unspecified hearing loss 2011  . Stricture and stenosis of esophagus 2011  . Diaphragmatic hernia without mention of obstruction or gangrene 2011  . Diverticulosis of colon (without mention of hemorrhage) 2011  . Lumbago 2011  . Dizziness and giddiness 2011  . Other malaise and fatigue 2011  . Abnormality of gait 2011  . Senile dementia, uncomplicated 7371  . Hypertension    Past Surgical History  Procedure Laterality Date  . Tonsillectomy  1923  . Cataract extraction w/ intraocular lens  implant, bilateral  1985  . Retinal detachment surgery    . Esophageal dilation  2011  . Breast cyst aspiration     Social History:   reports that she has never smoked. She has never used smokeless tobacco. She reports that she does not drink alcohol or use illicit drugs.  Medications: Patient's Medications  New Prescriptions   No medications on file  Previous Medications   ACETAMINOPHEN (TYLENOL) 325 MG TABLET    Take 650 mg by mouth every 6 (six) hours as needed for mild pain or moderate pain.   ASPIRIN 81 MG TABLET    Take 81 mg by mouth daily.   CALCIUM CARBONATE-VITAMIN D (CALCIUM 500 + D) 500-125 MG-UNIT TABS    Take 1 tablet by mouth. Take one daily   CARBOXYMETHYLCELLUL-GLYCERIN (OPTIVE) 0.5-0.9 % SOLN    Apply 1 drop to eye 4 (four) times daily. Instill 1 drop in both eyes four times daily. *Wait 3-5 minutes between eye drops.*   LEVOTHYROXINE (SYNTHROID, LEVOTHROID) 25 MCG TABLET    Take 25 mcg by mouth daily before breakfast. *Check pulse weekly on Saturday.*   LOSARTAN (COZAAR) 100 MG TABLET    Take 100 mg by mouth daily.   METOPROLOL (LOPRESSOR) 50 MG TABLET    Take 50 mg by mouth 2 (two) times daily. For blood pressure   MULTIPLE  VITAMINS-MINERALS (CERTAVITE SENIOR/ANTIOXIDANT PO)    Take 1 tablet by mouth. Take one daily   OMEPRAZOLE (PRILOSEC) 20 MG CAPSULE    Take 20 mg by mouth daily. *Do Not Crush* Take on a empty stomach.   SENNOSIDES-DOCUSATE SODIUM (SENOKOT-S) 8.6-50 MG TABLET    Take 1 tablet by mouth at bedtime.   TRIAMTERENE-HYDROCHLOROTHIAZIDE (MAXZIDE-25) 37.5-25 MG PER TABLET    Take 1 tablet by mouth M-F by mouth.  Modified Medications   No medications on file  Discontinued Medications   No medications on file     Physical Exam: Physical Exam  Constitutional: She is oriented to person, place, and time. She appears well-developed and well-nourished. No distress.  HENT:  Head: Normocephalic and atraumatic.  Right Ear: External ear normal.  Left Ear: External ear normal.  Nose: Nose normal.  Mouth/Throat: Oropharynx is clear and moist. No oropharyngeal exudate.  Eyes: Conjunctivae and EOM are normal. Pupils are equal, round, and reactive to light. Right eye exhibits no discharge. Left eye exhibits no discharge. No scleral icterus.  Neck: Normal range of motion. Neck supple. No JVD present. No tracheal deviation present. No thyromegaly present.  Cardiovascular: Normal rate, regular rhythm and normal heart sounds.  Exam reveals no friction rub.   No murmur heard. Pulmonary/Chest: Effort normal. No stridor. No respiratory distress. She has no wheezes. She has rales. She exhibits no tenderness.  Bibasilar   Abdominal: Soft. Bowel sounds are normal. She exhibits no distension. There is no tenderness. There is no rebound and no guarding.  Musculoskeletal: Normal range of motion. She exhibits edema. She exhibits no tenderness.  Trace edema RLE, 1+ edema left foot.  Ambulates with walker.   Lymphadenopathy:    She has no cervical adenopathy.  Neurological: She is alert and oriented to person, place, and time. She has normal reflexes. No cranial nerve deficit. She exhibits normal muscle tone. Coordination  normal.  Skin: Skin is warm and dry. No rash noted. She is not diaphoretic. No erythema. No pallor.  Lateral top of the left foot contusion, swelling, and warmth noted.   Psychiatric: She has a normal mood and affect. Her behavior is normal. Thought content normal. Cognition and memory are impaired. She expresses impulsivity and inappropriate judgment. She exhibits abnormal recent memory.    Filed Vitals:   11/27/14 1458  BP: 130/72  Pulse: 68  Temp: 97.2 F (36.2 C)  TempSrc: Tympanic  Resp: 28      Labs reviewed: Basic Metabolic Panel:  Recent Labs  02/10/14  07/26/14 10/27/14 11/01/14  NA 141  < > 138 131* 134*  K 3.8  < > 4.1 4.6 4.1  BUN 22*  < > 22* 33* 24*  CREATININE 1.2*  < > 1.1 1.1 1.1  TSH 3.74  --   --   --  3.02  < > = values in this interval not displayed.  CBC:  Recent Labs  02/10/14  WBC 7.6  HGB 12.3  HCT 36  PLT 292   Assessment/Plan Contusion of left foot Sustained from fall 11/25/14. No decreased ROM of left ankle and toes. Pain with weight bearing. Will X-ray 3 views of the left foot to rule out fxs.   Insomnia Reported x1 up and down at night, off Mirtazapine 7.5mg  nightly, continue to observe.     Hypothyroidism Takes Synthroid 27mcg daily. TSH 2.900 04/05/13. 3.744 02/10/14. 3.025 11/01/14   Hyponatremia Dropped Na from 138 previously to 131 10/27/14 in setting to increased BLE edema.  BNP 36.0 10/27/14 11/01/14 Na 134 11/22/14 continue Maxide 5x/week.     Hypertension Controlled on Losartan 100mg  and  Metoprolol 50mg  bid, monitor Bp daily     GERD (gastroesophageal reflux disease) Stable, takes Omeprazole 20mg  daily.     Edema Worse from previous trace to 1+ to 1-2+ today. Weight gained 3-4 Ib/a week so. No new O2 desaturation, SOB, cough, or sputum production.  BNP 36.0 10/27/14. Will increase Triamterene from 3x/wk to 5x/wk.  11/22/14 persisted BLE edema and increased Maxide since 10/30/14-will dc Aleve 220mg  daily for  now. Continue to observe the patient for s/s of developing CHF.  11/27/14 compensated clinically.     Depression off Mirtazapine 09/21/14 11/27/14 reportedly up and down and crying x1  night a few days ago.    Constipation Stable on Senokot S I nightly     Back pain, thoracic Multiple sites: OA in nature, continue Ibuprofen 200mg  bid.    Alzheimer's disease Baseline mentation now and no change since off  Namenda and Aricept 06/13/13.  MMSE 11/30 03/21/13. Observe the patient. Etiology of her confusion.        Family/ Staff Communication: observe the patient  Goals of Care: SNF  Labs/tests ordered: X-ray left foot 3 views

## 2014-12-18 ENCOUNTER — Non-Acute Institutional Stay (SKILLED_NURSING_FACILITY): Payer: Medicare Other | Admitting: Nurse Practitioner

## 2014-12-18 DIAGNOSIS — R609 Edema, unspecified: Secondary | ICD-10-CM

## 2014-12-18 DIAGNOSIS — E039 Hypothyroidism, unspecified: Secondary | ICD-10-CM

## 2014-12-18 DIAGNOSIS — K219 Gastro-esophageal reflux disease without esophagitis: Secondary | ICD-10-CM

## 2014-12-18 DIAGNOSIS — I10 Essential (primary) hypertension: Secondary | ICD-10-CM

## 2014-12-18 DIAGNOSIS — S9032XS Contusion of left foot, sequela: Secondary | ICD-10-CM

## 2014-12-18 DIAGNOSIS — E871 Hypo-osmolality and hyponatremia: Secondary | ICD-10-CM

## 2014-12-18 NOTE — Assessment & Plan Note (Signed)
Better managed with Ibuprofen bid and prn Tylenol.

## 2014-12-18 NOTE — Assessment & Plan Note (Signed)
Dropped Na from 138 previously to 131 10/27/14 in setting to increased BLE edema.  BNP 36.0 10/27/14 11/01/14 Na 134 11/22/14 continue Maxzide 5x/week.  12/18/13 Maxzide daily, BMP one week.

## 2014-12-18 NOTE — Progress Notes (Signed)
Patient ID: [name], female   DOB: 11-08-1913, 81 y.o.   MRN: [id number]   Code Status: DNR  No Known Allergies  Chief Complaint  Patient presents with  . Medical Management of Chronic Issues  . Acute Visit    left lower leg erthyema, warmth, tenderness, swelling.     HPI: Patient is a 81 y.o. female seen in the SNF at [facility] today for evaluation of LLE edema and chronic medical conditions.  Problem List Items Addressed This Visit    Hypothyroidism    Takes Synthroid 75mcg daily. TSH 2.900 04/05/13. 3.744 02/10/14. 3.025 11/01/14     Hyponatremia    Dropped Na from 138 previously to 131 10/27/14 in setting to increased BLE edema.  BNP 36.0 10/27/14 11/01/14 Na 134 11/22/14 continue Maxzide 5x/week.  12/18/13 Maxzide daily, BMP one week.        Hypertension    Controlled on Losartan 100mg  and  Metoprolol 50mg  bid, monitor Bp daily       GERD (gastroesophageal reflux disease)    Stable, takes Omeprazole 20mg  daily.        Edema - Primary (Chronic)    12/18/13 LLE 1-2+, takes Maxzide 37.5/25mg  daily. LLE warmth, tenderness, erythematous, swelling-Venous Doppler to r/o DVT, increased Maxzide to daily 12/18/13, may consider ABT if no better and DVT is ruled out. Bibasilar moist rales as prior. No worsening in respiratory symptoms. BMP in one week.      Contusion of left foot    Better managed with Ibuprofen bid and prn Tylenol.        Review of System:Review of Systems  Constitutional: Negative for fever, chills, weight loss, malaise/fatigue and diaphoresis.  HENT: Positive for hearing loss. Negative for congestion, ear discharge, ear pain, nosebleeds, sore throat and tinnitus.   Eyes: Negative for blurred vision, double vision, photophobia, pain, discharge and redness.  Respiratory: Negative for cough, hemoptysis, sputum production, shortness of breath, wheezing and stridor.   Cardiovascular: Positive for leg swelling. Negative for chest pain,  palpitations, orthopnea, claudication and PND.       1-2+ LLE-erythema, tenderness, warmth, trace RLE  Gastrointestinal: Negative for heartburn, nausea, vomiting, abdominal pain, diarrhea, constipation, blood in stool and melena.  Genitourinary: Positive for frequency. Negative for dysuria, urgency, hematuria and flank pain.  Musculoskeletal: Positive for falls. Negative for myalgias, back pain, joint pain and neck pain.       11/25/14  Skin: Negative for itching and rash.       Lateral top of the left foot contusion sustained from fall 11/25/14   Neurological: Negative for dizziness, tingling, tremors, sensory change, speech change, focal weakness, seizures, loss of consciousness, weakness and headaches.  Endo/Heme/Allergies: Negative for environmental allergies and polydipsia. Does not bruise/bleed easily.  Psychiatric/Behavioral: Positive for memory loss. Negative for depression, suicidal ideas, hallucinations and substance abuse. The patient is not nervous/anxious and does not have insomnia.      Past Medical History  Diagnosis Date  . Spasm of muscle 11/2011  . Thyroid disease   . Anemia of other chronic disease 2012  . Anxiety   . Peripheral vascular disease, unspecified 2011  . Reflux esophagitis 2011  . Vomiting alone 2011  . Benign neoplasm of colon 2011  . Type II or unspecified type diabetes mellitus without mention of complication, not stated as uncontrolled 2011  . Unspecified vitamin D deficiency 2011  . Hyperlipidemia   . Depression   . Alzheimer's disease 2011  . Unspecified hearing loss  2011  . Stricture and stenosis of esophagus 2011  . Diaphragmatic hernia without mention of obstruction or gangrene 2011  . Diverticulosis of colon (without mention of hemorrhage) 2011  . Lumbago 2011  . Dizziness and giddiness 2011  . Other malaise and fatigue 2011  . Abnormality of gait 2011  . Senile dementia, uncomplicated 7829  . Hypertension    Past Surgical History    Procedure Laterality Date  . Tonsillectomy  1923  . Cataract extraction w/ intraocular lens  implant, bilateral  1985  . Retinal detachment surgery    . Esophageal dilation  2011  . Breast cyst aspiration     Social History:   reports that she has never smoked. She has never used smokeless tobacco. She reports that she does not drink alcohol or use illicit drugs.  Medications: Patient's Medications  New Prescriptions   No medications on file  Previous Medications   ACETAMINOPHEN (TYLENOL) 325 MG TABLET    Take 650 mg by mouth every 6 (six) hours as needed for mild pain or moderate pain.   ASPIRIN 81 MG TABLET    Take 81 mg by mouth daily.   CALCIUM CARBONATE-VITAMIN D (CALCIUM 500 + D) 500-125 MG-UNIT TABS    Take 1 tablet by mouth. Take one daily   CARBOXYMETHYLCELLUL-GLYCERIN (OPTIVE) 0.5-0.9 % SOLN    Apply 1 drop to eye 4 (four) times daily. Instill 1 drop in both eyes four times daily. *Wait 3-5 minutes between eye drops.*   LEVOTHYROXINE (SYNTHROID, LEVOTHROID) 25 MCG TABLET    Take 25 mcg by mouth daily before breakfast. *Check pulse weekly on Saturday.*   LOSARTAN (COZAAR) 100 MG TABLET    Take 100 mg by mouth daily.   METOPROLOL (LOPRESSOR) 50 MG TABLET    Take 50 mg by mouth 2 (two) times daily. For blood pressure   MULTIPLE VITAMINS-MINERALS (CERTAVITE SENIOR/ANTIOXIDANT PO)    Take 1 tablet by mouth. Take one daily   OMEPRAZOLE (PRILOSEC) 20 MG CAPSULE    Take 20 mg by mouth daily. *Do Not Crush* Take on a empty stomach.   SENNOSIDES-DOCUSATE SODIUM (SENOKOT-S) 8.6-50 MG TABLET    Take 1 tablet by mouth at bedtime.   TRIAMTERENE-HYDROCHLOROTHIAZIDE (MAXZIDE-25) 37.5-25 MG PER TABLET    daily.   Modified Medications   No medications on file  Discontinued Medications   No medications on file     Physical Exam: Physical Exam  Constitutional: She is oriented to person, place, and time. She appears well-developed and well-nourished. No distress.  HENT:  Head:  Normocephalic and atraumatic.  Right Ear: External ear normal.  Left Ear: External ear normal.  Nose: Nose normal.  Mouth/Throat: Oropharynx is clear and moist. No oropharyngeal exudate.  Eyes: Conjunctivae and EOM are normal. Pupils are equal, round, and reactive to light. Right eye exhibits no discharge. Left eye exhibits no discharge. No scleral icterus.  Neck: Normal range of motion. Neck supple. No JVD present. No tracheal deviation present. No thyromegaly present.  Cardiovascular: Normal rate, regular rhythm and normal heart sounds.  Exam reveals no friction rub.   No murmur heard. Pulmonary/Chest: Effort normal. No stridor. No respiratory distress. She has no wheezes. She has rales. She exhibits no tenderness.  Bibasilar moist rales.   Abdominal: Soft. Bowel sounds are normal. She exhibits no distension. There is no tenderness. There is no rebound and no guarding.  Musculoskeletal: Normal range of motion. She exhibits edema. She exhibits no tenderness.  Trace edema RLE, 1+ edema  left foot.  Ambulates with walker.   Lymphadenopathy:    She has no cervical adenopathy.  Neurological: She is alert and oriented to person, place, and time. She has normal reflexes. No cranial nerve deficit. She exhibits normal muscle tone. Coordination normal.  Skin: Skin is warm and dry. No rash noted. She is not diaphoretic. There is erythema. No pallor.  1-2+ LLE-erythema, tenderness, warmth, trace RLE   Psychiatric: She has a normal mood and affect. Her behavior is normal. Thought content normal. Cognition and memory are impaired. She expresses impulsivity and inappropriate judgment. She exhibits abnormal recent memory.    Filed Vitals:   12/18/14 1238  BP: 120/68  Pulse: 62  Temp: 96.5 F (35.8 C)  TempSrc: Tympanic  Resp: 20  SpO2: 98%      Labs reviewed: Basic Metabolic Panel:  Recent Labs  02/10/14  07/26/14 10/27/14 11/01/14  NA 141  < > 138 131* 134*  K 3.8  < > 4.1 4.6 4.1  BUN  22*  < > 22* 33* 24*  CREATININE 1.2*  < > 1.1 1.1 1.1  TSH 3.74  --   --   --  3.02  < > = values in this interval not displayed.  CBC:  Recent Labs  02/10/14  WBC 7.6  HGB 12.3  HCT 36  PLT 292   Assessment/Plan Edema 12/18/13 LLE 1-2+, takes Maxzide 37.5/25mg  daily. LLE warmth, tenderness, erythematous, swelling-Venous Doppler to r/o DVT, increased Maxzide to daily 12/18/13, may consider ABT if no better and DVT is ruled out. Bibasilar moist rales as prior. No worsening in respiratory symptoms. BMP in one week.    Hypothyroidism Takes Synthroid 60mcg daily. TSH 2.900 04/05/13. 3.744 02/10/14. 3.025 11/01/14   Hyponatremia Dropped Na from 138 previously to 131 10/27/14 in setting to increased BLE edema.  BNP 36.0 10/27/14 11/01/14 Na 134 11/22/14 continue Maxzide 5x/week.  12/18/13 Maxzide daily, BMP one week.      Hypertension Controlled on Losartan 100mg  and  Metoprolol 50mg  bid, monitor Bp daily     GERD (gastroesophageal reflux disease) Stable, takes Omeprazole 20mg  daily.      Contusion of left foot Better managed with Ibuprofen bid and prn Tylenol.     Family/ Staff Communication: observe the patient  Goals of Care: SNF  Labs/tests ordered: LLE Venous Doppler. BMP

## 2014-12-18 NOTE — Assessment & Plan Note (Signed)
Stable, takes Omeprazole 20mg daily.  

## 2014-12-18 NOTE — Assessment & Plan Note (Addendum)
Takes Synthroid 49mcg daily. TSH 2.900 04/05/13. 3.744 02/10/14. 3.025 11/01/14

## 2014-12-18 NOTE — Assessment & Plan Note (Signed)
Controlled on Losartan 100mg  and  Metoprolol 50mg  bid, monitor Bp daily

## 2014-12-18 NOTE — Assessment & Plan Note (Signed)
12/18/13 LLE 1-2+, takes Maxzide 37.5/25mg  daily. LLE warmth, tenderness, erythematous, swelling-Venous Doppler to r/o DVT, increased Maxzide to daily 12/18/13, may consider ABT if no better and DVT is ruled out. Bibasilar moist rales as prior. No worsening in respiratory symptoms. BMP in one week.

## 2014-12-20 ENCOUNTER — Non-Acute Institutional Stay (SKILLED_NURSING_FACILITY): Payer: Medicare Other | Admitting: Nurse Practitioner

## 2014-12-20 ENCOUNTER — Encounter: Payer: Self-pay | Admitting: Nurse Practitioner

## 2014-12-20 DIAGNOSIS — K219 Gastro-esophageal reflux disease without esophagitis: Secondary | ICD-10-CM

## 2014-12-20 DIAGNOSIS — F0280 Dementia in other diseases classified elsewhere without behavioral disturbance: Secondary | ICD-10-CM

## 2014-12-20 DIAGNOSIS — K5900 Constipation, unspecified: Secondary | ICD-10-CM

## 2014-12-20 DIAGNOSIS — I10 Essential (primary) hypertension: Secondary | ICD-10-CM

## 2014-12-20 DIAGNOSIS — G309 Alzheimer's disease, unspecified: Secondary | ICD-10-CM

## 2014-12-20 DIAGNOSIS — F32A Depression, unspecified: Secondary | ICD-10-CM

## 2014-12-20 DIAGNOSIS — E871 Hypo-osmolality and hyponatremia: Secondary | ICD-10-CM

## 2014-12-20 DIAGNOSIS — G4700 Insomnia, unspecified: Secondary | ICD-10-CM

## 2014-12-20 DIAGNOSIS — S9032XA Contusion of left foot, initial encounter: Secondary | ICD-10-CM

## 2014-12-20 DIAGNOSIS — F329 Major depressive disorder, single episode, unspecified: Secondary | ICD-10-CM

## 2014-12-20 DIAGNOSIS — L03116 Cellulitis of left lower limb: Secondary | ICD-10-CM

## 2014-12-20 DIAGNOSIS — E039 Hypothyroidism, unspecified: Secondary | ICD-10-CM

## 2014-12-20 DIAGNOSIS — R609 Edema, unspecified: Secondary | ICD-10-CM

## 2014-12-20 NOTE — Progress Notes (Signed)
Patient ID: [name], female   DOB: 08/23/1913, 81 y.o.   MRN: [id number]   Code Status: DNR  No Known Allergies  Chief Complaint  Patient presents with  . Medical Management of Chronic Issues  . Acute Visit    LLE cellulitis    HPI: Patient is a 81 y.o. female seen in the SNF at [facility] today for evaluation of LLE edema/cellulitis and chronic medical conditions.  Problem List Items Addressed This Visit    Insomnia (Chronic)    Reported x1 up and down at night, off Mirtazapine 7.5mg  nightly, continue to observe.       Hypothyroidism    Takes Synthroid 3mcg daily. TSH 2.900 04/05/13. 3.744 02/10/14. 3.025 11/01/14     Hyponatremia    Dropped Na from 138 previously to 131 10/27/14 in setting to increased BLE edema.  BNP 36.0 10/27/14 11/01/14 Na 134 11/22/14 continue Maxzide 5x/week.  12/18/13 Maxzide daily, BMP one week.        Hypertension    Controlled on Losartan 100mg  and  Metoprolol 50mg  bid, monitor Bp daily     GERD (gastroesophageal reflux disease)    Stable, takes Omeprazole 20mg  daily.       Edema (Chronic)    12/18/13 LLE 1-2+, takes Maxzide 37.5/25mg  daily. LLE warmth, tenderness, erythematous, swelling-Venous Doppler to r/o DVT, increased Maxzide to daily 12/18/13. Venous Doppler LLE-negative for DVT     Depression    off Mirtazapine 09/21/14 11/27/14 reportedly up and down and crying x1 night a few days ago.  12/20/14 no change      Contusion of left foot    managed with Ibuprofen bid and prn Tylenol. Developed into cellulitis.      Constipation    Stable on Senokot S I nightly      Cellulitis of leg, left - Primary    12/18/14 US venous LLE no evidence of DVT 12/20/14 Doxycycline 100mg  bid x 2 weeks.      Alzheimer's disease    Baseline mentation now and no change since off  Namenda and Aricept 06/13/13.  MMSE 11/30 03/21/13. Observe the patient. Etiology of her confusion.           Review of System:Review of Systems    Constitutional: Negative for fever, chills, weight loss, malaise/fatigue and diaphoresis.  HENT: Positive for hearing loss. Negative for congestion, ear discharge, ear pain, nosebleeds, sore throat and tinnitus.   Eyes: Negative for blurred vision, double vision, photophobia, pain, discharge and redness.  Respiratory: Negative for cough, hemoptysis, sputum production, shortness of breath, wheezing and stridor.   Cardiovascular: Positive for leg swelling. Negative for chest pain, palpitations, orthopnea, claudication and PND.       1-2+ LLE-erythema, tenderness, warmth, trace RLE  Gastrointestinal: Negative for heartburn, nausea, vomiting, abdominal pain, diarrhea, constipation, blood in stool and melena.  Genitourinary: Positive for frequency. Negative for dysuria, urgency, hematuria and flank pain.  Musculoskeletal: Positive for falls. Negative for myalgias, back pain, joint pain and neck pain.       11/25/14  Skin: Negative for itching and rash.       Lateral top of the left foot contusion sustained from fall 11/25/14 12/20/13 warmth, erythema, swelling, and tenderness LLE   Neurological: Negative for dizziness, tingling, tremors, sensory change, speech change, focal weakness, seizures, loss of consciousness, weakness and headaches.  Endo/Heme/Allergies: Negative for environmental allergies and polydipsia. Does not bruise/bleed easily.  Psychiatric/Behavioral: Positive for memory loss. Negative for depression, suicidal ideas, hallucinations and  substance abuse. The patient is not nervous/anxious and does not have insomnia.      Past Medical History  Diagnosis Date  . Spasm of muscle 11/2011  . Thyroid disease   . Anemia of other chronic disease 2012  . Anxiety   . Peripheral vascular disease, unspecified 2011  . Reflux esophagitis 2011  . Vomiting alone 2011  . Benign neoplasm of colon 2011  . Type II or unspecified type diabetes mellitus without mention of complication, not stated as  uncontrolled 2011  . Unspecified vitamin D deficiency 2011  . Hyperlipidemia   . Depression   . Alzheimer's disease 2011  . Unspecified hearing loss 2011  . Stricture and stenosis of esophagus 2011  . Diaphragmatic hernia without mention of obstruction or gangrene 2011  . Diverticulosis of colon (without mention of hemorrhage) 2011  . Lumbago 2011  . Dizziness and giddiness 2011  . Other malaise and fatigue 2011  . Abnormality of gait 2011  . Senile dementia, uncomplicated 2376  . Hypertension    Past Surgical History  Procedure Laterality Date  . Tonsillectomy  1923  . Cataract extraction w/ intraocular lens  implant, bilateral  1985  . Retinal detachment surgery    . Esophageal dilation  2011  . Breast cyst aspiration     Social History:   reports that she has never smoked. She has never used smokeless tobacco. She reports that she does not drink alcohol or use illicit drugs.  Medications: Patient's Medications  New Prescriptions   No medications on file  Previous Medications   ACETAMINOPHEN (TYLENOL) 325 MG TABLET    Take 650 mg by mouth every 6 (six) hours as needed for mild pain or moderate pain.   ASPIRIN 81 MG TABLET    Take 81 mg by mouth daily.   CALCIUM CARBONATE-VITAMIN D (CALCIUM 500 + D) 500-125 MG-UNIT TABS    Take 1 tablet by mouth. Take one daily   CARBOXYMETHYLCELLUL-GLYCERIN (OPTIVE) 0.5-0.9 % SOLN    Apply 1 drop to eye 4 (four) times daily. Instill 1 drop in both eyes four times daily. *Wait 3-5 minutes between eye drops.*   LEVOTHYROXINE (SYNTHROID, LEVOTHROID) 25 MCG TABLET    Take 25 mcg by mouth daily before breakfast. *Check pulse weekly on Saturday.*   LOSARTAN (COZAAR) 100 MG TABLET    Take 100 mg by mouth daily.   METOPROLOL (LOPRESSOR) 50 MG TABLET    Take 50 mg by mouth 2 (two) times daily. For blood pressure   MULTIPLE VITAMINS-MINERALS (CERTAVITE SENIOR/ANTIOXIDANT PO)    Take 1 tablet by mouth. Take one daily   OMEPRAZOLE (PRILOSEC) 20 MG  CAPSULE    Take 20 mg by mouth daily. *Do Not Crush* Take on a empty stomach.   SENNOSIDES-DOCUSATE SODIUM (SENOKOT-S) 8.6-50 MG TABLET    Take 1 tablet by mouth at bedtime.   TRIAMTERENE-HYDROCHLOROTHIAZIDE (MAXZIDE-25) 37.5-25 MG PER TABLET    daily.   Modified Medications   No medications on file  Discontinued Medications   No medications on file     Physical Exam: Physical Exam  Constitutional: She is oriented to person, place, and time. She appears well-developed and well-nourished. No distress.  HENT:  Head: Normocephalic and atraumatic.  Right Ear: External ear normal.  Left Ear: External ear normal.  Nose: Nose normal.  Mouth/Throat: Oropharynx is clear and moist. No oropharyngeal exudate.  Eyes: Conjunctivae and EOM are normal. Pupils are equal, round, and reactive to light. Right eye exhibits no discharge.  Left eye exhibits no discharge. No scleral icterus.  Neck: Normal range of motion. Neck supple. No JVD present. No tracheal deviation present. No thyromegaly present.  Cardiovascular: Normal rate, regular rhythm and normal heart sounds.  Exam reveals no friction rub.   No murmur heard. Pulmonary/Chest: Effort normal. No stridor. No respiratory distress. She has no wheezes. She has rales. She exhibits no tenderness.  Bibasilar moist rales.   Abdominal: Soft. Bowel sounds are normal. She exhibits no distension. There is no tenderness. There is no rebound and no guarding.  Musculoskeletal: Normal range of motion. She exhibits edema. She exhibits no tenderness.  Trace edema RLE, 1+ edema left foot.  Ambulates with walker. 12/20/13 warmth, erythema, swelling, and tenderness LLE   Lymphadenopathy:    She has no cervical adenopathy.  Neurological: She is alert and oriented to person, place, and time. She has normal reflexes. No cranial nerve deficit. She exhibits normal muscle tone. Coordination normal.  Skin: Skin is warm and dry. No rash noted. She is not diaphoretic. There is  erythema. No pallor.  1-2+ LLE-erythema, tenderness, warmth, trace RLE1/6/15 warmth, erythema, swelling, and tenderness LLE    Psychiatric: She has a normal mood and affect. Her behavior is normal. Thought content normal. Cognition and memory are impaired. She expresses impulsivity and inappropriate judgment. She exhibits abnormal recent memory.    Filed Vitals:   12/20/14 1544  BP: 120/68  Pulse: 62  Temp: 96.5 F (35.8 C)  TempSrc: Tympanic  Resp: 20      Labs reviewed: Basic Metabolic Panel:  Recent Labs  02/10/14  07/26/14 10/27/14 11/01/14  NA 141  < > 138 131* 134*  K 3.8  < > 4.1 4.6 4.1  BUN 22*  < > 22* 33* 24*  CREATININE 1.2*  < > 1.1 1.1 1.1  TSH 3.74  --   --   --  3.02  < > = values in this interval not displayed.  CBC:  Recent Labs  02/10/14  WBC 7.6  HGB 12.3  HCT 36  PLT 292   Assessment/Plan Cellulitis of leg, left 12/18/14 US venous LLE no evidence of DVT 12/20/14 Doxycycline 100mg  bid x 2 weeks.    Insomnia Reported x1 up and down at night, off Mirtazapine 7.5mg  nightly, continue to observe.     Hypothyroidism Takes Synthroid 44mcg daily. TSH 2.900 04/05/13. 3.744 02/10/14. 3.025 11/01/14   Hyponatremia Dropped Na from 138 previously to 131 10/27/14 in setting to increased BLE edema.  BNP 36.0 10/27/14 11/01/14 Na 134 11/22/14 continue Maxzide 5x/week.  12/18/13 Maxzide daily, BMP one week.      Hypertension Controlled on Losartan 100mg  and  Metoprolol 50mg  bid, monitor Bp daily   GERD (gastroesophageal reflux disease) Stable, takes Omeprazole 20mg  daily.     Edema 12/18/13 LLE 1-2+, takes Maxzide 37.5/25mg  daily. LLE warmth, tenderness, erythematous, swelling-Venous Doppler to r/o DVT, increased Maxzide to daily 12/18/13. Venous Doppler LLE-negative for DVT   Depression off Mirtazapine 09/21/14 11/27/14 reportedly up and down and crying x1 night a few days ago.  12/20/14 no change    Contusion of left foot managed with  Ibuprofen bid and prn Tylenol. Developed into cellulitis.    Constipation Stable on Senokot S I nightly    Alzheimer's disease Baseline mentation now and no change since off  Namenda and Aricept 06/13/13.  MMSE 11/30 03/21/13. Observe the patient. Etiology of her confusion.        Family/ Staff Communication: observe the patient  Goals  of Care: SNF  Labs/tests ordered: none

## 2014-12-21 DIAGNOSIS — L03116 Cellulitis of left lower limb: Secondary | ICD-10-CM | POA: Insufficient documentation

## 2014-12-21 NOTE — Assessment & Plan Note (Signed)
off Mirtazapine 09/21/14 11/27/14 reportedly up and down and crying x1 night a few days ago.  12/20/14 no change

## 2014-12-21 NOTE — Assessment & Plan Note (Signed)
Reported x1 up and down at night, off Mirtazapine 7.5mg  nightly, continue to observe.

## 2014-12-21 NOTE — Assessment & Plan Note (Signed)
Takes Synthroid 33mcg daily. TSH 2.900 04/05/13. 3.744 02/10/14. 3.025 11/01/14

## 2014-12-21 NOTE — Assessment & Plan Note (Signed)
Stable on Senokot S I nightly

## 2014-12-21 NOTE — Assessment & Plan Note (Signed)
12/18/14 US venous LLE no evidence of DVT 12/20/14 Doxycycline 100mg  bid x 2 weeks.

## 2014-12-21 NOTE — Assessment & Plan Note (Signed)
Dropped Na from 138 previously to 131 10/27/14 in setting to increased BLE edema.  BNP 36.0 10/27/14 11/01/14 Na 134 11/22/14 continue Maxzide 5x/week.  12/18/13 Maxzide daily, BMP one week.

## 2014-12-21 NOTE — Assessment & Plan Note (Signed)
Stable, takes Omeprazole 20mg daily.  

## 2014-12-21 NOTE — Assessment & Plan Note (Signed)
Baseline mentation now and no change since off  Namenda and Aricept 06/13/13.  MMSE 11/30 03/21/13. Observe the patient. Etiology of her confusion.

## 2014-12-21 NOTE — Assessment & Plan Note (Signed)
managed with Ibuprofen bid and prn Tylenol. Developed into cellulitis.

## 2014-12-21 NOTE — Assessment & Plan Note (Signed)
12/18/13 LLE 1-2+, takes Maxzide 37.5/25mg  daily. LLE warmth, tenderness, erythematous, swelling-Venous Doppler to r/o DVT, increased Maxzide to daily 12/18/13. Venous Doppler LLE-negative for DVT

## 2014-12-21 NOTE — Assessment & Plan Note (Signed)
Controlled on Losartan 100mg  and  Metoprolol 50mg  bid, monitor Bp daily

## 2014-12-26 LAB — BASIC METABOLIC PANEL
BUN: 31 mg/dL — AB (ref 4–21)
CREATININE: 1.2 mg/dL — AB (ref 0.5–1.1)
Glucose: 135 mg/dL
POTASSIUM: 4 mmol/L (ref 3.4–5.3)
Sodium: 133 mmol/L — AB (ref 137–147)

## 2014-12-27 ENCOUNTER — Emergency Department (HOSPITAL_COMMUNITY)
Admission: EM | Admit: 2014-12-27 | Discharge: 2014-12-27 | Disposition: A | Discharge status: Home / Self Care | Payer: Medicare Other | Attending: Emergency Medicine | Admitting: Emergency Medicine

## 2014-12-27 ENCOUNTER — Non-Acute Institutional Stay (SKILLED_NURSING_FACILITY): Payer: Medicare Other | Admitting: Nurse Practitioner

## 2014-12-27 ENCOUNTER — Other Ambulatory Visit: Payer: Self-pay | Admitting: Emergency Medicine

## 2014-12-27 ENCOUNTER — Emergency Department (HOSPITAL_COMMUNITY)
Admission: RE | Admit: 2014-12-27 | Discharge: 2014-12-27 | Disposition: A | Discharge status: Home / Self Care | Payer: Medicare Other | Source: Ambulatory Visit | Attending: Emergency Medicine | Admitting: Emergency Medicine

## 2014-12-27 ENCOUNTER — Emergency Department (HOSPITAL_COMMUNITY): Payer: Medicare Other

## 2014-12-27 DIAGNOSIS — Y92128 Other place in nursing home as the place of occurrence of the external cause: Secondary | ICD-10-CM | POA: Diagnosis not present

## 2014-12-27 DIAGNOSIS — W1839XA Other fall on same level, initial encounter: Secondary | ICD-10-CM | POA: Diagnosis not present

## 2014-12-27 DIAGNOSIS — S63252A Unspecified dislocation of right middle finger, initial encounter: Secondary | ICD-10-CM | POA: Insufficient documentation

## 2014-12-27 DIAGNOSIS — K5900 Constipation, unspecified: Secondary | ICD-10-CM

## 2014-12-27 DIAGNOSIS — K219 Gastro-esophageal reflux disease without esophagitis: Secondary | ICD-10-CM

## 2014-12-27 DIAGNOSIS — F0280 Dementia in other diseases classified elsewhere without behavioral disturbance: Secondary | ICD-10-CM | POA: Insufficient documentation

## 2014-12-27 DIAGNOSIS — S60042A Contusion of left ring finger without damage to nail, initial encounter: Secondary | ICD-10-CM | POA: Insufficient documentation

## 2014-12-27 DIAGNOSIS — F419 Anxiety disorder, unspecified: Secondary | ICD-10-CM | POA: Diagnosis not present

## 2014-12-27 DIAGNOSIS — D649 Anemia, unspecified: Secondary | ICD-10-CM | POA: Diagnosis not present

## 2014-12-27 DIAGNOSIS — S63255A Unspecified dislocation of left ring finger, initial encounter: Secondary | ICD-10-CM

## 2014-12-27 DIAGNOSIS — E119 Type 2 diabetes mellitus without complications: Secondary | ICD-10-CM | POA: Diagnosis not present

## 2014-12-27 DIAGNOSIS — F32A Depression, unspecified: Secondary | ICD-10-CM

## 2014-12-27 DIAGNOSIS — I10 Essential (primary) hypertension: Secondary | ICD-10-CM | POA: Diagnosis not present

## 2014-12-27 DIAGNOSIS — F329 Major depressive disorder, single episode, unspecified: Secondary | ICD-10-CM | POA: Diagnosis not present

## 2014-12-27 DIAGNOSIS — S0101XA Laceration without foreign body of scalp, initial encounter: Secondary | ICD-10-CM | POA: Insufficient documentation

## 2014-12-27 DIAGNOSIS — E871 Hypo-osmolality and hyponatremia: Secondary | ICD-10-CM

## 2014-12-27 DIAGNOSIS — S60042D Contusion of left ring finger without damage to nail, subsequent encounter: Secondary | ICD-10-CM

## 2014-12-27 DIAGNOSIS — Z79899 Other long term (current) drug therapy: Secondary | ICD-10-CM | POA: Insufficient documentation

## 2014-12-27 DIAGNOSIS — S0181XA Laceration without foreign body of other part of head, initial encounter: Secondary | ICD-10-CM

## 2014-12-27 DIAGNOSIS — G309 Alzheimer's disease, unspecified: Secondary | ICD-10-CM

## 2014-12-27 DIAGNOSIS — T148 Other injury of unspecified body region: Secondary | ICD-10-CM | POA: Insufficient documentation

## 2014-12-27 DIAGNOSIS — Y998 Other external cause status: Secondary | ICD-10-CM | POA: Diagnosis not present

## 2014-12-27 DIAGNOSIS — S51012A Laceration without foreign body of left elbow, initial encounter: Secondary | ICD-10-CM | POA: Diagnosis not present

## 2014-12-27 DIAGNOSIS — L03116 Cellulitis of left lower limb: Secondary | ICD-10-CM

## 2014-12-27 DIAGNOSIS — T07XXXA Unspecified multiple injuries, initial encounter: Secondary | ICD-10-CM

## 2014-12-27 DIAGNOSIS — E559 Vitamin D deficiency, unspecified: Secondary | ICD-10-CM | POA: Insufficient documentation

## 2014-12-27 DIAGNOSIS — H9190 Unspecified hearing loss, unspecified ear: Secondary | ICD-10-CM | POA: Diagnosis not present

## 2014-12-27 DIAGNOSIS — E039 Hypothyroidism, unspecified: Secondary | ICD-10-CM

## 2014-12-27 DIAGNOSIS — E079 Disorder of thyroid, unspecified: Secondary | ICD-10-CM | POA: Insufficient documentation

## 2014-12-27 DIAGNOSIS — S0081XD Abrasion of other part of head, subsequent encounter: Secondary | ICD-10-CM

## 2014-12-27 DIAGNOSIS — W19XXXA Unspecified fall, initial encounter: Secondary | ICD-10-CM

## 2014-12-27 DIAGNOSIS — Y9389 Activity, other specified: Secondary | ICD-10-CM | POA: Diagnosis not present

## 2014-12-27 DIAGNOSIS — Z7982 Long term (current) use of aspirin: Secondary | ICD-10-CM | POA: Diagnosis not present

## 2014-12-27 DIAGNOSIS — Y92129 Unspecified place in nursing home as the place of occurrence of the external cause: Secondary | ICD-10-CM

## 2014-12-27 DIAGNOSIS — S0081XA Abrasion of other part of head, initial encounter: Secondary | ICD-10-CM | POA: Insufficient documentation

## 2014-12-27 DIAGNOSIS — S0990XA Unspecified injury of head, initial encounter: Secondary | ICD-10-CM | POA: Diagnosis present

## 2014-12-27 DIAGNOSIS — R609 Edema, unspecified: Secondary | ICD-10-CM

## 2014-12-27 LAB — CBC AND DIFFERENTIAL
HCT: 34 % — AB (ref 36–46)
Hemoglobin: 11.6 g/dL — AB (ref 12.0–16.0)
PLATELETS: 387 10*3/uL (ref 150–399)
WBC: 12.5 10*3/mL

## 2014-12-27 NOTE — Assessment & Plan Note (Signed)
Takes Synthroid 30mcg daily. TSH 2.900 04/05/13. 3.744 02/10/14. 3.025 11/01/14

## 2014-12-27 NOTE — Assessment & Plan Note (Signed)
Controlled on Losartan 100mg  and  Metoprolol 50mg  bid, monitor Bp daily

## 2014-12-27 NOTE — Discharge Instructions (Signed)
Abrasions An abrasion is a cut or scrape of the skin. Abrasions do not go through all layers of the skin. HOME CARE  If a bandage (dressing) was put on your wound, change it as told by your doctor. If the bandage sticks, soak it off with warm.  Wash the area with water and soap 2 times a day. Rinse off the soap. Pat the area dry with a clean towel.  Put on medicated cream (ointment) as told by your doctor.  Change your bandage right away if it gets wet or dirty.  Only take medicine as told by your doctor.  See your doctor within 24-48 hours to get your wound checked.  Check your wound for redness, puffiness (swelling), or yellowish-white fluid (pus). GET HELP RIGHT AWAY IF:   You have more pain in the wound.  You have redness, swelling, or tenderness around the wound.  You have pus coming from the wound.  You have a fever or lasting symptoms for more than 2-3 days.  You have a fever and your symptoms suddenly get worse.  You have a bad smell coming from the wound or bandage. MAKE SURE YOU:   Understand these instructions.  Will watch your condition.  Will get help right away if you are not doing well or get worse. Document Released: 05/19/2008 Document Revised: 08/25/2012 Document Reviewed: 11/04/2011 [facility] Patient Information 2015 [place], [place]. This information is not intended to replace advice given to you by your health care provider. Make sure you discuss any questions you have with your health care provider.  Contusion A contusion is a deep bruise. Contusions happen when an injury causes bleeding under the skin. Signs of bruising include pain, puffiness (swelling), and discolored skin. The contusion may turn blue, purple, or yellow. HOME CARE   Put ice on the injured area.  Put ice in a plastic bag.  Place a towel between your skin and the bag.  Leave the ice on for 15-20 minutes, 03-04 times a day.  Only take medicine as told by your doctor.  Rest the  injured area.  If possible, raise (elevate) the injured area to lessen puffiness. GET HELP RIGHT AWAY IF:   You have more bruising or puffiness.  You have pain that is getting worse.  Your puffiness or pain is not helped by medicine. MAKE SURE YOU:   Understand these instructions.  Will watch your condition.  Will get help right away if you are not doing well or get worse. Document Released: 05/19/2008 Document Revised: 02/23/2012 Document Reviewed: 10/06/2011 [facility] Patient Information 2015 [place], [place]. This information is not intended to replace advice given to you by your health care provider. Make sure you discuss any questions you have with your health care provider.  Facial Laceration A facial laceration is a cut on the face. These injuries can be painful and cause bleeding. Some cuts may need to be closed with stitches (sutures), skin adhesive strips, or wound glue. Cuts usually heal quickly but can leave a scar. It can take 1-2 years for the scar to go away completely. HOME CARE   Only take medicines as told by your doctor.  Follow your doctor's instructions for wound care. For Stitches:  Keep the cut clean and dry.  If you have a bandage (dressing), change it at least once a day. Change the bandage if it gets wet or dirty, or as told by your doctor.  Wash the cut with soap and water 2 times a day.  Rinse the cut with water. Pat it dry with a clean towel.  Put a thin layer of medicated cream on the cut as told by your doctor.  You may shower after the first 24 hours. Do not soak the cut in water until the stitches are removed.  Have your stitches removed as told by your doctor.  Do not wear any makeup until a few days after your stitches are removed. For Skin Adhesive Strips:  Keep the cut clean and dry.  Do not get the strips wet. You may take a bath, but be careful to keep the cut dry.  If the cut gets wet, pat it dry with a clean towel.  The strips  will fall off on their own. Do not remove the strips that are still stuck to the cut. For Wound Glue:  You may shower or take baths. Do not soak or scrub the cut. Do not swim. Avoid heavy sweating until the glue falls off on its own. After a shower or bath, pat the cut dry with a clean towel.  Do not put medicine or makeup on your cut until the glue falls off.  If you have a bandage, do not put tape over the glue.  Avoid lots of sunlight or tanning lamps until the glue falls off.  The glue will fall off on its own in 5-10 days. Do not pick at the glue. After Healing: Put sunscreen on the cut for the first year to reduce your scar. GET HELP RIGHT AWAY IF:   Your cut area gets red, painful, or puffy (swollen).  You see a yellowish-white fluid (pus) coming from the cut.  You have chills or a fever. MAKE SURE YOU:   Understand these instructions.  Will watch your condition.  Will get help right away if you are not doing well or get worse. Document Released: 05/19/2008 Document Revised: 09/21/2013 Document Reviewed: 07/14/2013 [facility] Patient Information 2015 [place], [place]. This information is not intended to replace advice given to you by your health care provider. Make sure you discuss any questions you have with your health care provider.  Finger Dislocation  A finger dislocation happens when your finger bones separate from where they connect with each other. It usually happens to the joint closest to your knuckle (proximal interphalangeal joint). Your doctor will put your bones back in the joint. This may be done by pulling on your finger or through surgery. A bandage (dressing) or splint will be placed around your joint. The bandage or splint holds your finger in place while it heals. HOME CARE   Rest your injured joint. Do not move it until told to do so.  Put ice on your injured joint as told by your doctor.  Put ice in a plastic bag.  Place a towel between your skin  and the bag.  Leave the ice on for 15-20 minutes at a time. Do this every 2 hours while you are awake.  Raise (elevate) your hand above your heart as told by your doctor.  Only take medicines as told by your doctor. GET HELP RIGHT AWAY IF:  Your bandage or splint becomes damaged.  Your pain becomes worse, not better.  You lose feeling in your finger or it becomes cold or white. MAKE SURE YOU:  Understand these instructions.  Will watch your condition.  Will get help right away if you are not doing well or get worse. Document Released: 11/20/2011 Document Revised: 02/23/2012 Document Reviewed: 11/20/2011 [employer]  Patient Information 2015 [place]. This information is not intended to replace advice given to you by your health care provider. Make sure you discuss any questions you have with your health care provider.  Hyponatremia  Hyponatremia is when the salt (sodium) in your blood is low. When salt becomes low, your cells take in extra water and puff up (swell). The puffiness can happen in the whole body. It mostly affects the brain and is very serious.  HOME CARE  Only take medicine as told by your doctor.  Follow any diet instructions you were given. This includes limiting how much fluid you drink.  Keep all doctor visits for tests as told.  Avoid alcohol and drugs. GET HELP RIGHT AWAY IF:  You start to twitch and shake (seize).  You pass out (faint).  You continue to have watery poop (diarrhea) or you throw up (vomit).  You feel sick to your stomach (nauseous).  You are tired (fatigued), have a headache, are confused, or feel weak.  Your problems that first brought you to the doctor come back.  You have trouble following your diet instructions. MAKE SURE YOU:   Understand these instructions.  Will watch your condition.  Will get help right away if you are not doing well or get worse. Document Released: 08/13/2011 Document Revised: 02/23/2012 Document  Reviewed: 08/13/2011 [facility] Patient Information 2015 [place], [place]. This information is not intended to replace advice given to you by your health care provider. Make sure you discuss any questions you have with your health care provider.

## 2014-12-27 NOTE — ED Notes (Signed)
See previous paper chart for other information.

## 2014-12-27 NOTE — ED Notes (Signed)
[facility] called to transport [facility] [place].

## 2014-12-27 NOTE — Assessment & Plan Note (Signed)
ED reported: dislocation of fourth finger. X-ray left shoulder, forearm, hand, and right shoulder unremarkable. Continue the left left 4th finger binding to the left 3rd finger-observe.

## 2014-12-27 NOTE — ED Provider Notes (Signed)
Please see down time documentation  Results for orders placed or performed in visit on 67/12/45  Basic metabolic panel  Result Value Ref Range   Glucose 155 mg/dL   BUN 24 (A) 4 - 21 mg/dL   Creatinine 1.1 0.5 - 1.1 mg/dL   Potassium 4.1 3.4 - 5.3 mmol/L   Sodium 134 (A) 137 - 147 mmol/L  TSH  Result Value Ref Range   TSH 3.02 0.41 - 5.90 uIU/mL   Dg Shoulder Right  12/27/2014   CLINICAL DATA:  Status post fall; bilateral shoulder pain. Initial encounter.  EXAM: RIGHT SHOULDER - 2+ VIEW  COMPARISON:  None.  FINDINGS: There is no evidence of fracture or dislocation. The right humeral head is seated within the glenoid fossa. Mild osteophyte formation is noted at the medial aspect of the humeral head, with mild sclerotic change noted at the glenoid. Mild degenerative change is noted at the right acromioclavicular joint. No significant soft tissue abnormalities are seen. The visualized portions of the right lung are clear.  IMPRESSION: 1. No evidence of fracture or dislocation. 2. Mild degenerative change at the right glenohumeral joint.   Electronically Signed   By: Garald Balding M.D.   On: 12/27/2014 05:04   Dg Forearm Left  12/27/2014   CLINICAL DATA:  Status post fall, with abrasions of the mid left forearm. Initial encounter.  EXAM: LEFT FOREARM - 2 VIEWS  COMPARISON:  None.  FINDINGS: There is no evidence of fracture or dislocation. The radius and ulna appear intact. The elbow joint is incompletely assessed, but appears grossly unremarkable.  Mild degenerative change is noted at the first carpometacarpal joint. Known soft tissue abrasions are not well characterized on radiograph.  IMPRESSION: No evidence of fracture or dislocation.   Electronically Signed   By: Garald Balding M.D.   On: 12/27/2014 03:40   Dg Shoulder Left  12/27/2014   CLINICAL DATA:  Status post fall, with bilateral shoulder pain. Initial encounter.  EXAM: LEFT SHOULDER - 2+ VIEWS  COMPARISON:  None.  FINDINGS: There is no  evidence of fracture or dislocation. The left humeral head is seated within the glenoid fossa. Mild degenerative cortical irregularity is noted at the glenoid. Minimal degenerative change is noted at the left acromioclavicular joint. There is suggestion of minimal calcification overlying the expected location of the rotator cuff. The visualized portions of the left lung are clear.  IMPRESSION: No evidence of fracture or dislocation.   Electronically Signed   By: Garald Balding M.D.   On: 12/27/2014 05:01   Dg Hand Complete Left  12/27/2014   CLINICAL DATA:  Status post fall, with bruising at the dorsum of the left hand. Initial encounter.  EXAM: LEFT HAND - 3 VIEWS  COMPARISON:  None.  FINDINGS: There is no evidence of fracture or dislocation. Mild osteoarthritis is noted at the first digit, and calcification is noted about the expected location of the triangular fibrocartilage. The joint spaces are otherwise preserved. The carpal rows are intact, and demonstrate normal alignment.  Dorsal soft tissue swelling is noted overlying the metacarpophalangeal joints.  IMPRESSION: 1. No evidence of fracture or dislocation. 2. Mild osteoarthritis noted.   Electronically Signed   By: Garald Balding M.D.   On: 12/27/2014 03:37      Kalman Drape, MD 12/27/14 [phone]

## 2014-12-27 NOTE — Assessment & Plan Note (Signed)
Stable on Senokot S I nightly

## 2014-12-27 NOTE — Assessment & Plan Note (Signed)
Dropped Na from 138 previously to 131 10/27/14 in setting to increased BLE edema.  BNP 36.0 10/27/14 11/01/14 Na 134 11/22/14 continue Maxzide 5x/week.  12/18/13 Maxzide daily, BMP one week.  12/26/14 Na 133 12/27/14 Na 130-BMP one week.

## 2014-12-27 NOTE — Assessment & Plan Note (Signed)
12/27/14 sustained from falling at [facility]. ED eval.   CT head w/o contrast:  IMPRESSION: 1. No evidence of traumatic intracranial injury or fracture. 2. No evidence of fracture or dislocation with regard to the maxillofacial structures. 3. No evidence of acute fracture or subluxation along the cervical spine. 4. Soft tissue swelling overlying the left frontoparietal calvarium. 5. Moderately severe cortical volume loss and scattered small vessel ischemic microangiopathy. 6. Mild degenerative change noted along the cervical spine. 7. Scattered calcification about the left mandibular condylar head, concerning for chronic left temporomandibular joint disease. 8. 1.6 cm hypodensity at the left thyroid lobe. Consider further evaluation with thyroid ultrasound. If patient is clinically hyperthyroid, consider nuclear medicine thyroid uptake and scan. 9. Dense calcification at the carotid bifurcations bilaterally. Carotid ultrasound would be helpful for further evaluation, when and as deemed clinically appropriate.

## 2014-12-27 NOTE — Assessment & Plan Note (Signed)
Baseline mentation now and no change since off  Namenda and Aricept 06/13/13.  MMSE 11/30 03/21/13. Observe the patient. Etiology of her confusion.

## 2014-12-27 NOTE — ED Notes (Signed)
L middle and L ring finger buddy taped together, dermabond placed by EDP to L forehead.

## 2014-12-27 NOTE — Assessment & Plan Note (Signed)
Improving.

## 2014-12-27 NOTE — Progress Notes (Signed)
Patient ID: [name], female   DOB: 1913-11-03, 81 y.o.   MRN: [id number]   Code Status: DNR  No Known Allergies  Chief Complaint  Patient presents with  . Medical Management of Chronic Issues  . Acute Visit    s/p fall, dislocated the left 4th finger.     HPI: Patient is a 81 y.o. female seen in the SNF at [facility] today for evaluation of s/p fall 12/27/13, dislocated the left 4th finger,  and chronic medical conditions.    ED eval 12/27/14 for dislocated the left 4th finger and multiple abrasions sustained from fall 12/27/14. CT head, maxi facial, neck. X-ray L+R shoulders, left forearm, left hand unremarkable.      Problem List Items Addressed This Visit    Hypothyroidism    Takes Synthroid 108mcg daily. TSH 2.900 04/05/13. 3.744 02/10/14. 3.025 11/01/14       Hyponatremia    Dropped Na from 138 previously to 131 10/27/14 in setting to increased BLE edema.  BNP 36.0 10/27/14 11/01/14 Na 134 11/22/14 continue Maxzide 5x/week.  12/18/13 Maxzide daily, BMP one week.  12/26/14 Na 133 12/27/14 Na 130-BMP one week.          Hypertension    Controlled on Losartan 100mg  and  Metoprolol 50mg  bid, monitor Bp daily        GERD (gastroesophageal reflux disease)    Stable, takes Omeprazole 20mg  daily.         Facial abrasion - Primary    12/27/14 sustained from falling at [facility]. ED eval.   CT head w/o contrast:  IMPRESSION: 1. No evidence of traumatic intracranial injury or fracture. 2. No evidence of fracture or dislocation with regard to the maxillofacial structures. 3. No evidence of acute fracture or subluxation along the cervical spine. 4. Soft tissue swelling overlying the left frontoparietal calvarium. 5. Moderately severe cortical volume loss and scattered small vessel ischemic microangiopathy. 6. Mild degenerative change noted along the cervical spine. 7. Scattered calcification about the left mandibular condylar head, concerning for chronic left  temporomandibular joint disease. 8. 1.6 cm hypodensity at the left thyroid lobe. Consider further evaluation with thyroid ultrasound. If patient is clinically hyperthyroid, consider nuclear medicine thyroid uptake and scan. 9. Dense calcification at the carotid bifurcations bilaterally. Carotid ultrasound would be helpful for further evaluation, when and as deemed clinically appropriate.       Edema (Chronic)    12/18/13 LLE 1-2+, takes Maxzide 37.5/25mg  daily. LLE warmth, tenderness, erythematous, swelling-Venous Doppler to r/o DVT, increased Maxzide to daily 12/18/13. Venous Doppler LLE-negative for DVT       Depression    off Mirtazapine 09/21/14 11/27/14 reportedly up and down and crying x1 night a few days ago.  12/20/14 no change        Contusion of left ring finger    ED reported: dislocation of fourth finger. X-ray left shoulder, forearm, hand, and right shoulder unremarkable. Continue the left left 4th finger binding to the left 3rd finger-observe.       Constipation    Stable on Senokot S I nightly        Cellulitis of leg, left    Improving.       Alzheimer's disease    Baseline mentation now and no change since off  Namenda and Aricept 06/13/13.  MMSE 11/30 03/21/13. Observe the patient. Etiology of her confusion.            Review of System:Review of Systems  Constitutional: Positive  for malaise/fatigue. Negative for fever, chills, weight loss and diaphoresis.  HENT: Positive for hearing loss. Negative for congestion, ear discharge, ear pain, nosebleeds, sore throat and tinnitus.   Eyes: Negative for blurred vision, double vision, photophobia, pain, discharge and redness.  Respiratory: Negative for cough, hemoptysis, sputum production, shortness of breath, wheezing and stridor.   Cardiovascular: Positive for leg swelling. Negative for chest pain, palpitations, orthopnea, claudication and PND.  Gastrointestinal: Negative for heartburn, nausea, vomiting,  abdominal pain, diarrhea, constipation, blood in stool and melena.  Genitourinary: Positive for frequency. Negative for dysuria, urgency, hematuria and flank pain.  Musculoskeletal: Positive for back pain, joint pain, falls and neck pain. Negative for myalgias.  Skin: Negative for itching and rash.       Multiple abrasions: left facial, left forearm, left hand.   Neurological: Positive for sensory change and weakness. Negative for dizziness, tingling, tremors, speech change, focal weakness, seizures, loss of consciousness and headaches.  Endo/Heme/Allergies: Negative for environmental allergies and polydipsia. Does not bruise/bleed easily.  Psychiatric/Behavioral: Positive for memory loss. Negative for depression, suicidal ideas, hallucinations and substance abuse. The patient is nervous/anxious and has insomnia.      Past Medical History  Diagnosis Date  . Spasm of muscle 11/2011  . Thyroid disease   . Anemia of other chronic disease 2012  . Anxiety   . Peripheral vascular disease, unspecified 2011  . Reflux esophagitis 2011  . Vomiting alone 2011  . Benign neoplasm of colon 2011  . Type II or unspecified type diabetes mellitus without mention of complication, not stated as uncontrolled 2011  . Unspecified vitamin D deficiency 2011  . Hyperlipidemia   . Depression   . Alzheimer's disease 2011  . Unspecified hearing loss 2011  . Stricture and stenosis of esophagus 2011  . Diaphragmatic hernia without mention of obstruction or gangrene 2011  . Diverticulosis of colon (without mention of hemorrhage) 2011  . Lumbago 2011  . Dizziness and giddiness 2011  . Other malaise and fatigue 2011  . Abnormality of gait 2011  . Senile dementia, uncomplicated 0932  . Hypertension    Past Surgical History  Procedure Laterality Date  . Tonsillectomy  1923  . Cataract extraction w/ intraocular lens  implant, bilateral  1985  . Retinal detachment surgery    . Esophageal dilation  2011  .  Breast cyst aspiration     Social History:   reports that she has never smoked. She has never used smokeless tobacco. She reports that she does not drink alcohol or use illicit drugs.  Medications: Patient's Medications  New Prescriptions   No medications on file  Previous Medications   ACETAMINOPHEN (TYLENOL) 325 MG TABLET    Take 650 mg by mouth every 6 (six) hours as needed for mild pain or moderate pain.   ASPIRIN 81 MG CHEWABLE TABLET    Chew 81 mg by mouth daily.   CALCIUM CARBONATE-VITAMIN D (CALCIUM 500 + D) 500-125 MG-UNIT TABS    Take 1 tablet by mouth. Take one daily   CARBOXYMETHYLCELLUL-GLYCERIN (OPTIVE) 0.5-0.9 % SOLN    Apply 1 drop to eye 4 (four) times daily. Instill 1 drop in both eyes four times daily. *Wait 3-5 minutes between eye drops.*   CETIRIZINE (ZYRTEC) 5 MG TABLET    Take 5 mg by mouth daily as needed (itching).   DOXYCYCLINE (VIBRA-TABS) 100 MG TABLET    Take 100 mg by mouth 2 (two) times daily. 14 day therapy course patient to complete on 01/03/2015  LEVOTHYROXINE (SYNTHROID, LEVOTHROID) 25 MCG TABLET    Take 25 mcg by mouth daily before breakfast. *Check pulse weekly on Saturday.*   LOSARTAN (COZAAR) 100 MG TABLET    Take 100 mg by mouth daily.   METOPROLOL (LOPRESSOR) 50 MG TABLET    Take 50 mg by mouth 2 (two) times daily. For blood pressure   MULTIPLE VITAMINS-MINERALS (CERTAVITE SENIOR/ANTIOXIDANT PO)    Take 1 tablet by mouth. Take one daily   OMEPRAZOLE (PRILOSEC) 20 MG CAPSULE    Take 20 mg by mouth daily. *Do Not Crush* Take on a empty stomach.   SACCHAROMYCES BOULARDII (FLORASTOR) 250 MG CAPSULE    Take 250 mg by mouth 2 (two) times daily. 14 day therapy patient to complete course on 01/03/2015   SENNOSIDES-DOCUSATE SODIUM (SENOKOT-S) 8.6-50 MG TABLET    Take 1 tablet by mouth at bedtime.   TRIAMTERENE-HYDROCHLOROTHIAZIDE (MAXZIDE-25) 37.5-25 MG PER TABLET    Take 1 tablet by mouth daily.   Modified Medications   No medications on file  Discontinued  Medications   No medications on file     Physical Exam: Physical Exam  Constitutional: She is oriented to person, place, and time. She appears well-developed and well-nourished. No distress.  HENT:  Head: Normocephalic and atraumatic.  Right Ear: External ear normal.  Left Ear: External ear normal.  Nose: Nose normal.  Mouth/Throat: Oropharynx is clear and moist. No oropharyngeal exudate.  Eyes: Conjunctivae and EOM are normal. Pupils are equal, round, and reactive to light. Right eye exhibits no discharge. Left eye exhibits no discharge. No scleral icterus.  Neck: Normal range of motion. Neck supple. No JVD present. No tracheal deviation present. No thyromegaly present.  Cardiovascular: Normal rate, regular rhythm and normal heart sounds.  Exam reveals no friction rub.   No murmur heard. Pulmonary/Chest: Effort normal. No stridor. No respiratory distress. She has no wheezes. She has rales. She exhibits no tenderness.  Bibasilar moist rales.   Abdominal: Soft. Bowel sounds are normal. She exhibits no distension. There is no tenderness. There is no rebound and no guarding.  Musculoskeletal: Normal range of motion. She exhibits edema. She exhibits no tenderness.  Trace edema RLE, 1+ edema left foot.  Ambulates with walker. 12/20/13 warmth, erythema, swelling, and tenderness LLE   Lymphadenopathy:    She has no cervical adenopathy.  Neurological: She is alert and oriented to person, place, and time. She has normal reflexes. No cranial nerve deficit. She exhibits normal muscle tone. Coordination normal.  Skin: Skin is warm and dry. No rash noted. She is not diaphoretic. There is erythema. No pallor.  1+ BLE-erythema, tenderness, warmth-better Abrasions: left face, left forearm, and left hand-sustained from fall 12/27/13  Left 4th finger bind to the left 3rd finger due to dislocation-swelling and bruise apparent. Pain is minimal except uncomfortable to her patient.     Psychiatric: She has a  normal mood and affect. Her behavior is normal. Thought content normal. Cognition and memory are impaired. She expresses impulsivity and inappropriate judgment. She exhibits abnormal recent memory.    Filed Vitals:   12/27/14 1312  BP: 130/68  Pulse: 76  Temp: 96.9 F (36.1 C)  TempSrc: Tympanic  Resp: 20      Labs reviewed: Basic Metabolic Panel:  Recent Labs  02/10/14  10/27/14 11/01/14 12/26/14  NA 141  < > 131* 134* 133*  K 3.8  < > 4.6 4.1 4.0  BUN 22*  < > 33* 24* 31*  CREATININE 1.2*  < >  1.1 1.1 1.2*  TSH 3.74  --   --  3.02  --   < > = values in this interval not displayed.  CBC:  Recent Labs  02/10/14 12/27/14  WBC 7.6 12.5  HGB 12.3 11.6*  HCT 36 34*  PLT 292 387   Assessment/Plan Facial abrasion 12/27/14 sustained from falling at [facility]. ED eval.   CT head w/o contrast:  IMPRESSION: 1. No evidence of traumatic intracranial injury or fracture. 2. No evidence of fracture or dislocation with regard to the maxillofacial structures. 3. No evidence of acute fracture or subluxation along the cervical spine. 4. Soft tissue swelling overlying the left frontoparietal calvarium. 5. Moderately severe cortical volume loss and scattered small vessel ischemic microangiopathy. 6. Mild degenerative change noted along the cervical spine. 7. Scattered calcification about the left mandibular condylar head, concerning for chronic left temporomandibular joint disease. 8. 1.6 cm hypodensity at the left thyroid lobe. Consider further evaluation with thyroid ultrasound. If patient is clinically hyperthyroid, consider nuclear medicine thyroid uptake and scan. 9. Dense calcification at the carotid bifurcations bilaterally. Carotid ultrasound would be helpful for further evaluation, when and as deemed clinically appropriate.    Contusion of left ring finger ED reported: dislocation of fourth finger. X-ray left shoulder, forearm, hand, and right shoulder unremarkable.  Continue the left left 4th finger binding to the left 3rd finger-observe.    Hyponatremia Dropped Na from 138 previously to 131 10/27/14 in setting to increased BLE edema.  BNP 36.0 10/27/14 11/01/14 Na 134 11/22/14 continue Maxzide 5x/week.  12/18/13 Maxzide daily, BMP one week.  12/26/14 Na 133 12/27/14 Na 130-BMP one week.       Alzheimer's disease Baseline mentation now and no change since off  Namenda and Aricept 06/13/13.  MMSE 11/30 03/21/13. Observe the patient. Etiology of her confusion.      Cellulitis of leg, left Improving.    Constipation Stable on Senokot S I nightly     Depression off Mirtazapine 09/21/14 11/27/14 reportedly up and down and crying x1 night a few days ago.  12/20/14 no change     Edema 12/18/13 LLE 1-2+, takes Maxzide 37.5/25mg  daily. LLE warmth, tenderness, erythematous, swelling-Venous Doppler to r/o DVT, increased Maxzide to daily 12/18/13. Venous Doppler LLE-negative for DVT    GERD (gastroesophageal reflux disease) Stable, takes Omeprazole 20mg  daily.      Hypertension Controlled on Losartan 100mg  and  Metoprolol 50mg  bid, monitor Bp daily     Hypothyroidism Takes Synthroid 38mcg daily. TSH 2.900 04/05/13. 3.744 02/10/14. 3.025 11/01/14      Family/ Staff Communication: observe the patient  Goals of Care: SNF  Labs/tests ordered: BMP one week.

## 2014-12-27 NOTE — Assessment & Plan Note (Signed)
off Mirtazapine 09/21/14 11/27/14 reportedly up and down and crying x1 night a few days ago.  12/20/14 no change

## 2014-12-27 NOTE — Assessment & Plan Note (Signed)
12/18/13 LLE 1-2+, takes Maxzide 37.5/25mg  daily. LLE warmth, tenderness, erythematous, swelling-Venous Doppler to r/o DVT, increased Maxzide to daily 12/18/13. Venous Doppler LLE-negative for DVT

## 2014-12-27 NOTE — Assessment & Plan Note (Signed)
Stable, takes Omeprazole 20mg daily.  

## 2015-01-04 LAB — BASIC METABOLIC PANEL
BUN: 56 mg/dL — AB (ref 4–21)
Creatinine: 1.5 mg/dL — AB (ref 0.5–1.1)
Glucose: 118 mg/dL
Potassium: 4.5 mmol/L (ref 3.4–5.3)
SODIUM: 136 mmol/L — AB (ref 137–147)

## 2015-01-08 ENCOUNTER — Non-Acute Institutional Stay (SKILLED_NURSING_FACILITY): Payer: Medicare Other | Admitting: Nurse Practitioner

## 2015-01-08 DIAGNOSIS — E871 Hypo-osmolality and hyponatremia: Secondary | ICD-10-CM

## 2015-01-08 DIAGNOSIS — I8311 Varicose veins of right lower extremity with inflammation: Secondary | ICD-10-CM

## 2015-01-08 DIAGNOSIS — K219 Gastro-esophageal reflux disease without esophagitis: Secondary | ICD-10-CM

## 2015-01-08 DIAGNOSIS — G309 Alzheimer's disease, unspecified: Secondary | ICD-10-CM

## 2015-01-08 DIAGNOSIS — L03116 Cellulitis of left lower limb: Secondary | ICD-10-CM

## 2015-01-08 DIAGNOSIS — I10 Essential (primary) hypertension: Secondary | ICD-10-CM

## 2015-01-08 DIAGNOSIS — F32A Depression, unspecified: Secondary | ICD-10-CM

## 2015-01-08 DIAGNOSIS — K5900 Constipation, unspecified: Secondary | ICD-10-CM

## 2015-01-08 DIAGNOSIS — N182 Chronic kidney disease, stage 2 (mild): Secondary | ICD-10-CM

## 2015-01-08 DIAGNOSIS — F329 Major depressive disorder, single episode, unspecified: Secondary | ICD-10-CM

## 2015-01-08 DIAGNOSIS — E039 Hypothyroidism, unspecified: Secondary | ICD-10-CM

## 2015-01-08 DIAGNOSIS — G4700 Insomnia, unspecified: Secondary | ICD-10-CM

## 2015-01-08 DIAGNOSIS — I872 Venous insufficiency (chronic) (peripheral): Secondary | ICD-10-CM | POA: Insufficient documentation

## 2015-01-08 DIAGNOSIS — R609 Edema, unspecified: Secondary | ICD-10-CM

## 2015-01-08 DIAGNOSIS — I8312 Varicose veins of left lower extremity with inflammation: Secondary | ICD-10-CM

## 2015-01-08 DIAGNOSIS — F0280 Dementia in other diseases classified elsewhere without behavioral disturbance: Secondary | ICD-10-CM

## 2015-01-08 DIAGNOSIS — N189 Chronic kidney disease, unspecified: Secondary | ICD-10-CM | POA: Insufficient documentation

## 2015-01-08 NOTE — Assessment & Plan Note (Signed)
Stable, takes Omeprazole 20mg daily.  

## 2015-01-08 NOTE — Progress Notes (Signed)
Patient ID: [name], female   DOB: Apr 30, 1913, 81 y.o.   MRN: [id number]   Code Status: DNR  No Known Allergies  Chief Complaint  Patient presents with  . Medical Management of Chronic Issues  . Acute Visit    BLE itching, LLE pain, insomnia.     HPI: Patient is a 81 y.o. female seen in the SNF at [facility] today for evaluation of BLE venous dermatitis, insomnia,  and chronic medical conditions.    ED eval 12/27/14 for dislocated the left 4th finger and multiple abrasions sustained from fall 12/27/14. CT head, maxi facial, neck. X-ray L+R shoulders, left forearm, left hand unremarkable.      Problem List Items Addressed This Visit    Venous stasis dermatitis of both lower extremities    LLE>RLL edema 1+. Chronic venous dermatitis-itching, erythematous, lipo dermatosclerosis changes-will apply Mycolog II cream bid BLE       Insomnia - Primary (Chronic)    Not sleep at night since off Mirtazapine 7.5mg  nightly, continue to observe. Will resume it.        Hypothyroidism    Takes Synthroid 71mcg daily. TSH 2.900 04/05/13. 3.744 02/10/14. 3.025 11/01/14        Hyponatremia    10/27/14 Na 131 11/01/14 Na 134 12/26/14 Na 133 12/27/14 Na 130 01/04/15 Na 136      Hypertension    Controlled on Losartan 100mg  and  Metoprolol 50mg  bid, monitor Bp daily        GERD (gastroesophageal reflux disease)    Stable, takes Omeprazole 20mg  daily.        Edema (Chronic)    12/18/13 LLE 1-2+, takes Maxzide 37.5/25mg  daily. LLE warmth, tenderness, erythematous, swelling-Venous Doppler to r/o DVT, increased Maxzide to daily 12/18/13. Venous Doppler LLE-negative for DVT 01/08/15 improved, 1+. Chronic venous dermatitis-itching, erythematous, lipo dermatosclerosis changes-will apply Mycolog II cream bid BLE         Depression    off Mirtazapine 09/21/14 11/27/14 reportedly up and down and crying x1 night a few days ago.  01/08/15-not sleep at night-resume Mirtazapine 7.5mg   nightly.       Constipation    Stable on Senokot S I nightly       Chronic kidney disease    01/04/15 Bun/creat 56/1.50       Cellulitis of leg, left    Improving. Completed 2 wks of Doxy 01/03/15. Baseline venous dermatitis.        Alzheimer's disease    Baseline mentation now and no change since off  Namenda and Aricept 06/13/13.  MMSE 11/30 03/21/13. Observe the patient. Etiology of her confusion.            Review of System:Review of Systems  Constitutional: Positive for malaise/fatigue. Negative for fever, chills, weight loss and diaphoresis.       Generatlized  HENT: Positive for hearing loss. Negative for congestion, ear discharge, ear pain, nosebleeds, sore throat and tinnitus.   Eyes: Negative for blurred vision, double vision, photophobia, pain, discharge and redness.  Respiratory: Negative for cough, hemoptysis, sputum production, shortness of breath, wheezing and stridor.   Cardiovascular: Positive for leg swelling. Negative for chest pain, palpitations, orthopnea, claudication and PND.       BLE 1+  Gastrointestinal: Negative for heartburn, nausea, vomiting, abdominal pain, diarrhea, constipation, blood in stool and melena.  Genitourinary: Positive for frequency. Negative for dysuria, urgency, hematuria and flank pain.  Musculoskeletal: Positive for joint pain. Negative for myalgias, back pain, falls and  neck pain.       LLE pain  Skin: Positive for itching and rash.       BLE chronic venous dermatitis-Lipo dermatosclerosis change  Neurological: Positive for weakness. Negative for dizziness, tingling, tremors, sensory change, speech change, focal weakness, seizures, loss of consciousness and headaches.  Endo/Heme/Allergies: Negative for environmental allergies. Does not bruise/bleed easily.  Psychiatric/Behavioral: Positive for memory loss. Negative for depression, suicidal ideas, hallucinations and substance abuse. The patient is nervous/anxious and has insomnia.        Past Medical History  Diagnosis Date  . Spasm of muscle 11/2011  . Thyroid disease   . Anemia of other chronic disease 2012  . Anxiety   . Peripheral vascular disease, unspecified 2011  . Reflux esophagitis 2011  . Vomiting alone 2011  . Benign neoplasm of colon 2011  . Type II or unspecified type diabetes mellitus without mention of complication, not stated as uncontrolled 2011  . Unspecified vitamin D deficiency 2011  . Hyperlipidemia   . Depression   . Alzheimer's disease 2011  . Unspecified hearing loss 2011  . Stricture and stenosis of esophagus 2011  . Diaphragmatic hernia without mention of obstruction or gangrene 2011  . Diverticulosis of colon (without mention of hemorrhage) 2011  . Lumbago 2011  . Dizziness and giddiness 2011  . Other malaise and fatigue 2011  . Abnormality of gait 2011  . Senile dementia, uncomplicated 7829  . Hypertension    Past Surgical History  Procedure Laterality Date  . Tonsillectomy  1923  . Cataract extraction w/ intraocular lens  implant, bilateral  1985  . Retinal detachment surgery    . Esophageal dilation  2011  . Breast cyst aspiration     Social History:   reports that she has never smoked. She has never used smokeless tobacco. She reports that she does not drink alcohol or use illicit drugs.  Medications: Patient's Medications  New Prescriptions   No medications on file  Previous Medications   ACETAMINOPHEN (TYLENOL) 325 MG TABLET    Take 650 mg by mouth every 6 (six) hours as needed for mild pain or moderate pain.   ASPIRIN 81 MG CHEWABLE TABLET    Chew 81 mg by mouth daily.   CALCIUM CARBONATE-VITAMIN D (CALCIUM 500 + D) 500-125 MG-UNIT TABS    Take 1 tablet by mouth. Take one daily   CARBOXYMETHYLCELLUL-GLYCERIN (OPTIVE) 0.5-0.9 % SOLN    Apply 1 drop to eye 4 (four) times daily. Instill 1 drop in both eyes four times daily. *Wait 3-5 minutes between eye drops.*   CETIRIZINE (ZYRTEC) 5 MG TABLET    Take 5 mg by mouth  daily as needed (itching).   LEVOTHYROXINE (SYNTHROID, LEVOTHROID) 25 MCG TABLET    Take 25 mcg by mouth daily before breakfast. *Check pulse weekly on Saturday.*   LOSARTAN (COZAAR) 100 MG TABLET    Take 100 mg by mouth daily.   METOPROLOL (LOPRESSOR) 50 MG TABLET    Take 50 mg by mouth 2 (two) times daily. For blood pressure   MULTIPLE VITAMINS-MINERALS (CERTAVITE SENIOR/ANTIOXIDANT PO)    Take 1 tablet by mouth. Take one daily   OMEPRAZOLE (PRILOSEC) 20 MG CAPSULE    Take 20 mg by mouth daily. *Do Not Crush* Take on a empty stomach.   SACCHAROMYCES BOULARDII (FLORASTOR) 250 MG CAPSULE    Take 250 mg by mouth 2 (two) times daily. 14 day therapy patient to complete course on 01/03/2015   SENNOSIDES-DOCUSATE SODIUM (SENOKOT-S) 8.6-50  MG TABLET    Take 1 tablet by mouth at bedtime.   TRIAMTERENE-HYDROCHLOROTHIAZIDE (MAXZIDE-25) 37.5-25 MG PER TABLET    Take 1 tablet by mouth daily.   Modified Medications   No medications on file  Discontinued Medications   DOXYCYCLINE (VIBRA-TABS) 100 MG TABLET    Take 100 mg by mouth 2 (two) times daily. 14 day therapy course patient to complete on 01/03/2015     Physical Exam: Physical Exam  Constitutional: She is oriented to person, place, and time. She appears well-developed and well-nourished. No distress.  HENT:  Head: Normocephalic and atraumatic.  Right Ear: External ear normal.  Left Ear: External ear normal.  Nose: Nose normal.  Mouth/Throat: Oropharynx is clear and moist. No oropharyngeal exudate.  Eyes: Conjunctivae and EOM are normal. Pupils are equal, round, and reactive to light. Right eye exhibits no discharge. Left eye exhibits no discharge. No scleral icterus.  Neck: Normal range of motion. Neck supple. No JVD present. No tracheal deviation present. No thyromegaly present.  Cardiovascular: Normal rate, regular rhythm and normal heart sounds.  Exam reveals no friction rub.   No murmur heard. Pulmonary/Chest: Effort normal. No stridor. No  respiratory distress. She has no wheezes. She has rales. She exhibits no tenderness.  Bibasilar moist rales.   Abdominal: Soft. Bowel sounds are normal. She exhibits no distension. There is no tenderness. There is no rebound and no guarding.  Musculoskeletal: Normal range of motion. She exhibits edema. She exhibits no tenderness.  Trace edema RLE, 1+ edema left foot.  Ambulates with walker. 12/20/13 warmth, erythema, swelling, and tenderness LLE   Lymphadenopathy:    She has no cervical adenopathy.  Neurological: She is alert and oriented to person, place, and time. She has normal reflexes. No cranial nerve deficit. She exhibits normal muscle tone. Coordination normal.  Skin: Skin is warm and dry. No rash noted. She is not diaphoretic. There is erythema. No pallor.  1+ BLE-erythema, tenderness, warmth-better Left 4th finger bind to the left 3rd finger due to dislocation-swelling and bruise apparent. Pain is minimal except uncomfortable to her patient.  Venous dermatitis appearance.     Psychiatric: She has a normal mood and affect. Her behavior is normal. Thought content normal. Cognition and memory are impaired. She expresses impulsivity and inappropriate judgment. She exhibits abnormal recent memory.    Filed Vitals:   01/08/15 1433  BP: 130/60  Pulse: 66  Temp: 98 F (36.7 C)  TempSrc: Tympanic  Resp: 20      Labs reviewed: Basic Metabolic Panel:  Recent Labs  02/10/14  11/01/14 12/26/14 01/04/15  NA 141  < > 134* 133* 136*  K 3.8  < > 4.1 4.0 4.5  BUN 22*  < > 24* 31* 56*  CREATININE 1.2*  < > 1.1 1.2* 1.5*  TSH 3.74  --  3.02  --   --   < > = values in this interval not displayed.  CBC:  Recent Labs  02/10/14 12/27/14  WBC 7.6 12.5  HGB 12.3 11.6*  HCT 36 34*  PLT 292 387   Assessment/Plan Insomnia Not sleep at night since off Mirtazapine 7.5mg  nightly, continue to observe. Will resume it.     Edema 12/18/13 LLE 1-2+, takes Maxzide 37.5/25mg  daily. LLE  warmth, tenderness, erythematous, swelling-Venous Doppler to r/o DVT, increased Maxzide to daily 12/18/13. Venous Doppler LLE-negative for DVT 01/08/15 improved, 1+. Chronic venous dermatitis-itching, erythematous, lipo dermatosclerosis changes-will apply Mycolog II cream bid BLE      Venous  stasis dermatitis of both lower extremities LLE>RLL edema 1+. Chronic venous dermatitis-itching, erythematous, lipo dermatosclerosis changes-will apply Mycolog II cream bid BLE    Hypertension Controlled on Losartan 100mg  and  Metoprolol 50mg  bid, monitor Bp daily     Depression off Mirtazapine 09/21/14 11/27/14 reportedly up and down and crying x1 night a few days ago.  01/08/15-not sleep at night-resume Mirtazapine 7.5mg  nightly.    Alzheimer's disease Baseline mentation now and no change since off  Namenda and Aricept 06/13/13.  MMSE 11/30 03/21/13. Observe the patient. Etiology of her confusion.      Hypothyroidism Takes Synthroid 48mcg daily. TSH 2.900 04/05/13. 3.744 02/10/14. 3.025 11/01/14     Constipation Stable on Senokot S I nightly    GERD (gastroesophageal reflux disease) Stable, takes Omeprazole 20mg  daily.     Hyponatremia 10/27/14 Na 131 11/01/14 Na 134 12/26/14 Na 133 12/27/14 Na 130 01/04/15 Na 136   Cellulitis of leg, left Improving. Completed 2 wks of Doxy 01/03/15. Baseline venous dermatitis.     Chronic kidney disease 01/04/15 Bun/creat 56/1.50      Family/ Staff Communication: observe the patient  Goals of Care: SNF  Labs/tests ordered: BMP one week

## 2015-01-08 NOTE — Assessment & Plan Note (Signed)
Controlled on Losartan 100mg  and  Metoprolol 50mg  bid, monitor Bp daily

## 2015-01-08 NOTE — Assessment & Plan Note (Signed)
LLE>RLL edema 1+. Chronic venous dermatitis-itching, erythematous, lipo dermatosclerosis changes-will apply Mycolog II cream bid BLE

## 2015-01-08 NOTE — Assessment & Plan Note (Signed)
12/18/13 LLE 1-2+, takes Maxzide 37.5/25mg  daily. LLE warmth, tenderness, erythematous, swelling-Venous Doppler to r/o DVT, increased Maxzide to daily 12/18/13. Venous Doppler LLE-negative for DVT 01/08/15 improved, 1+. Chronic venous dermatitis-itching, erythematous, lipo dermatosclerosis changes-will apply Mycolog II cream bid BLE

## 2015-01-08 NOTE — Assessment & Plan Note (Signed)
Takes Synthroid 56mcg daily. TSH 2.900 04/05/13. 3.744 02/10/14. 3.025 11/01/14

## 2015-01-08 NOTE — Assessment & Plan Note (Signed)
Improving. Completed 2 wks of Doxy 01/03/15. Baseline venous dermatitis.

## 2015-01-08 NOTE — Assessment & Plan Note (Signed)
off Mirtazapine 09/21/14 11/27/14 reportedly up and down and crying x1 night a few days ago.  01/08/15-not sleep at night-resume Mirtazapine 7.5mg  nightly.

## 2015-01-08 NOTE — Assessment & Plan Note (Signed)
Not sleep at night since off Mirtazapine 7.5mg  nightly, continue to observe. Will resume it.

## 2015-01-08 NOTE — Assessment & Plan Note (Signed)
Stable on Senokot S I nightly

## 2015-01-08 NOTE — Assessment & Plan Note (Signed)
01/04/15 Bun/creat 56/1.50

## 2015-01-08 NOTE — Assessment & Plan Note (Signed)
Baseline mentation now and no change since off  Namenda and Aricept 06/13/13.  MMSE 11/30 03/21/13. Observe the patient. Etiology of her confusion.

## 2015-01-08 NOTE — Assessment & Plan Note (Signed)
10/27/14 Na 131 11/01/14 Na 134 12/26/14 Na 133 12/27/14 Na 130 01/04/15 Na 136

## 2015-01-11 LAB — HEPATIC FUNCTION PANEL
ALK PHOS: 127 U/L — AB (ref 25–125)
ALT: 11 U/L (ref 7–35)
AST: 15 U/L (ref 13–35)
Bilirubin, Total: 0.3 mg/dL

## 2015-01-11 LAB — BASIC METABOLIC PANEL
BUN: 30 mg/dL — AB (ref 4–21)
CREATININE: 1.3 mg/dL — AB (ref 0.5–1.1)
GLUCOSE: 118 mg/dL
Potassium: 4.1 mmol/L (ref 3.4–5.3)
SODIUM: 136 mmol/L — AB (ref 137–147)

## 2015-01-15 ENCOUNTER — Encounter: Payer: Self-pay | Admitting: Nurse Practitioner

## 2015-01-15 ENCOUNTER — Non-Acute Institutional Stay (SKILLED_NURSING_FACILITY): Payer: Medicare Other | Admitting: Nurse Practitioner

## 2015-01-15 DIAGNOSIS — K5900 Constipation, unspecified: Secondary | ICD-10-CM

## 2015-01-15 DIAGNOSIS — F32A Depression, unspecified: Secondary | ICD-10-CM

## 2015-01-15 DIAGNOSIS — E039 Hypothyroidism, unspecified: Secondary | ICD-10-CM

## 2015-01-15 DIAGNOSIS — E871 Hypo-osmolality and hyponatremia: Secondary | ICD-10-CM

## 2015-01-15 DIAGNOSIS — N182 Chronic kidney disease, stage 2 (mild): Secondary | ICD-10-CM

## 2015-01-15 DIAGNOSIS — F329 Major depressive disorder, single episode, unspecified: Secondary | ICD-10-CM

## 2015-01-15 DIAGNOSIS — I10 Essential (primary) hypertension: Secondary | ICD-10-CM

## 2015-01-15 DIAGNOSIS — K219 Gastro-esophageal reflux disease without esophagitis: Secondary | ICD-10-CM

## 2015-01-15 DIAGNOSIS — M546 Pain in thoracic spine: Secondary | ICD-10-CM

## 2015-01-15 DIAGNOSIS — R609 Edema, unspecified: Secondary | ICD-10-CM

## 2015-01-15 NOTE — Assessment & Plan Note (Signed)
Stable, takes Omeprazole 20mg daily.  

## 2015-01-15 NOTE — Assessment & Plan Note (Signed)
12/18/13 LLE 1-2+, takes Maxzide 37.5/25mg  daily. LLE warmth, tenderness, erythematous, swelling-Venous Doppler to r/o DVT, increased Maxzide to daily 12/18/13. Venous Doppler LLE-negative for DVT 01/08/15 improved, 1+. Chronic venous dermatitis-itching, erythematous, lipo dermatosclerosis changes-will apply Mycolog II cream bid BLE  01/15/15 continue to improved-trace edema BLE

## 2015-01-15 NOTE — Assessment & Plan Note (Signed)
off Mirtazapine 09/21/14 11/27/14 reportedly up and down and crying x1 night a few days ago.  01/08/15-not sleep at night-resume Mirtazapine 7.5mg  nightly.  01/15/15 improved.

## 2015-01-15 NOTE — Assessment & Plan Note (Signed)
Multiple sites: OA in nature, continue Ibuprofen 200mg  bid.

## 2015-01-15 NOTE — Progress Notes (Signed)
Patient ID: [name], female   DOB: [birth year], 81 y.o.   MRN: [id number]   Code Status: DNR  No Known Allergies  Chief Complaint  Patient presents with  . Medical Management of Chronic Issues  . Acute Visit    BLE edema.     HPI: Patient is a 81 y.o. female seen in the SNF at [facility] today for evaluation of BLE edema and chronic medical conditions.    ED eval 12/27/14 for dislocated the left 4th finger and multiple abrasions sustained from fall 12/27/14. CT head, maxi facial, neck. X-ray L+R shoulders, left forearm, left hand unremarkable.      Problem List Items Addressed This Visit    Hypothyroidism    Takes Synthroid 93mcg daily. TSH 2.900 04/05/13. 3.744 02/10/14. 3.025 11/01/14       Hyponatremia    Resolved.       Hypertension    Controlled on Losartan 100mg  and  Metoprolol 50mg  bid, monitor Bp daily       GERD (gastroesophageal reflux disease)    Stable, takes Omeprazole 20mg  daily.         Edema (Chronic)    12/18/13 LLE 1-2+, takes Maxzide 37.5/25mg  daily. LLE warmth, tenderness, erythematous, swelling-Venous Doppler to r/o DVT, increased Maxzide to daily 12/18/13. Venous Doppler LLE-negative for DVT 01/08/15 improved, 1+. Chronic venous dermatitis-itching, erythematous, lipo dermatosclerosis changes-will apply Mycolog II cream bid BLE  01/15/15 continue to improved-trace edema BLE         Depression    off Mirtazapine 09/21/14 11/27/14 reportedly up and down and crying x1 night a few days ago.  01/08/15-not sleep at night-resume Mirtazapine 7.5mg  nightly.  01/15/15 improved.       Constipation    Stable on Senokot S I nightly       Chronic kidney disease - Primary    01/04/15 Bun/creat 56/1.50 01/11/15 Bun/creat 30/1.32       Back pain, thoracic    Multiple sites: OA in nature, continue Ibuprofen 200mg  bid.          Review of System:Review of Systems  Constitutional: Negative for fever, chills, weight loss, malaise/fatigue and  diaphoresis.  HENT: Positive for hearing loss. Negative for congestion, ear discharge, ear pain, nosebleeds, sore throat and tinnitus.   Eyes: Negative for blurred vision, double vision, photophobia, pain, discharge and redness.  Respiratory: Negative for cough, hemoptysis, sputum production, shortness of breath, wheezing and stridor.   Cardiovascular: Positive for leg swelling. Negative for chest pain, palpitations, orthopnea, claudication and PND.       Trace  Gastrointestinal: Negative for heartburn, nausea, vomiting, abdominal pain, diarrhea, constipation, blood in stool and melena.  Genitourinary: Positive for frequency. Negative for dysuria, urgency, hematuria and flank pain.       Incontinent of bladder.   Musculoskeletal: Positive for joint pain. Negative for myalgias, back pain, falls and neck pain.  Skin: Negative for itching and rash.  Neurological: Negative for dizziness, tingling, tremors, sensory change, speech change, focal weakness, seizures, loss of consciousness, weakness and headaches.  Endo/Heme/Allergies: Negative for environmental allergies and polydipsia. Does not bruise/bleed easily.  Psychiatric/Behavioral: Positive for depression and memory loss. Negative for suicidal ideas, hallucinations and substance abuse. The patient is nervous/anxious and has insomnia.      Past Medical History  Diagnosis Date  . Spasm of muscle 11/2011  . Thyroid disease   . Anemia of other chronic disease 2012  . Anxiety   . Peripheral vascular disease, unspecified 2011  .  Reflux esophagitis 2011  . Vomiting alone 2011  . Benign neoplasm of colon 2011  . Type II or unspecified type diabetes mellitus without mention of complication, not stated as uncontrolled 2011  . Unspecified vitamin D deficiency 2011  . Hyperlipidemia   . Depression   . Alzheimer's disease 2011  . Unspecified hearing loss 2011  . Stricture and stenosis of esophagus 2011  . Diaphragmatic hernia without mention of  obstruction or gangrene 2011  . Diverticulosis of colon (without mention of hemorrhage) 2011  . Lumbago 2011  . Dizziness and giddiness 2011  . Other malaise and fatigue 2011  . Abnormality of gait 2011  . Senile dementia, uncomplicated 5361  . Hypertension    Past Surgical History  Procedure Laterality Date  . Tonsillectomy  1923  . Cataract extraction w/ intraocular lens  implant, bilateral  1985  . Retinal detachment surgery    . Esophageal dilation  2011  . Breast cyst aspiration     Social History:   reports that she has never smoked. She has never used smokeless tobacco. She reports that she does not drink alcohol or use illicit drugs.  Medications: Patient's Medications  New Prescriptions   No medications on file  Previous Medications   ACETAMINOPHEN (TYLENOL) 325 MG TABLET    Take 650 mg by mouth every 6 (six) hours as needed for mild pain or moderate pain.   CARBOXYMETHYLCELLUL-GLYCERIN (OPTIVE) 0.5-0.9 % SOLN    Apply 1 drop to eye 4 (four) times daily. Instill 1 drop in both eyes four times daily. *Wait 3-5 minutes between eye drops.*   CETIRIZINE (ZYRTEC) 5 MG TABLET    Take 5 mg by mouth daily as needed (itching).   LEVOTHYROXINE (SYNTHROID, LEVOTHROID) 25 MCG TABLET    Take 25 mcg by mouth daily before breakfast. *Check pulse weekly on Saturday.*   LOSARTAN (COZAAR) 100 MG TABLET    Take 100 mg by mouth daily.   METOPROLOL (LOPRESSOR) 50 MG TABLET    Take 50 mg by mouth 2 (two) times daily. For blood pressure   OMEPRAZOLE (PRILOSEC) 20 MG CAPSULE    Take 20 mg by mouth daily. *Do Not Crush* Take on a empty stomach.   SACCHAROMYCES BOULARDII (FLORASTOR) 250 MG CAPSULE    Take 250 mg by mouth 2 (two) times daily. 14 day therapy patient to complete course on 01/03/2015   SENNOSIDES-DOCUSATE SODIUM (SENOKOT-S) 8.6-50 MG TABLET    Take 1 tablet by mouth at bedtime.   TRIAMTERENE-HYDROCHLOROTHIAZIDE (MAXZIDE-25) 37.5-25 MG PER TABLET    Take 1 tablet by mouth daily.     Modified Medications   No medications on file  Discontinued Medications   ASPIRIN 81 MG CHEWABLE TABLET    Chew 81 mg by mouth daily.   CALCIUM CARBONATE-VITAMIN D (CALCIUM 500 + D) 500-125 MG-UNIT TABS    Take 1 tablet by mouth. Take one daily   MULTIPLE VITAMINS-MINERALS (CERTAVITE SENIOR/ANTIOXIDANT PO)    Take 1 tablet by mouth. Take one daily     Physical Exam: Physical Exam  Constitutional: She is oriented to person, place, and time. She appears well-developed and well-nourished. No distress.  HENT:  Head: Normocephalic and atraumatic.  Right Ear: External ear normal.  Left Ear: External ear normal.  Nose: Nose normal.  Mouth/Throat: Oropharynx is clear and moist. No oropharyngeal exudate.  Eyes: Conjunctivae and EOM are normal. Pupils are equal, round, and reactive to light. Right eye exhibits no discharge. Left eye exhibits no discharge. No  scleral icterus.  Neck: Normal range of motion. Neck supple. No JVD present. No tracheal deviation present. No thyromegaly present.  Cardiovascular: Normal rate, regular rhythm and normal heart sounds.  Exam reveals no friction rub.   No murmur heard. Pulmonary/Chest: Effort normal. No stridor. No respiratory distress. She has no wheezes. She has rales. She exhibits no tenderness.  Bibasilar moist rales.   Abdominal: Soft. Bowel sounds are normal. She exhibits no distension. There is no tenderness. There is no rebound and no guarding.  Musculoskeletal: Normal range of motion. She exhibits edema. She exhibits no tenderness.  Trace edema RLE, 1+ edema left foot.  Ambulates with walker. 12/20/13 warmth, erythema, swelling, and tenderness LLE   Lymphadenopathy:    She has no cervical adenopathy.  Neurological: She is alert and oriented to person, place, and time. She has normal reflexes. No cranial nerve deficit. She exhibits normal muscle tone. Coordination normal.  Skin: Skin is warm and dry. No rash noted. She is not diaphoretic. There is  erythema. No pallor.  1+ BLE-erythema, tenderness, warmth-better Left 4th finger bind to the left 3rd finger due to dislocation-swelling and bruise apparent. Pain is minimal except uncomfortable to her patient.  Venous dermatitis appearance.     Psychiatric: She has a normal mood and affect. Her behavior is normal. Thought content normal. Cognition and memory are impaired. She expresses impulsivity and inappropriate judgment. She exhibits abnormal recent memory.    Filed Vitals:   01/15/15 1533  BP: 100/68  Pulse: 72  Temp: 97.6 F (36.4 C)  TempSrc: Tympanic  Resp: 18      Labs reviewed: Basic Metabolic Panel:  Recent Labs  02/10/14  11/01/14 12/26/14 01/04/15 01/11/15  NA 141  < > 134* 133* 136* 136*  K 3.8  < > 4.1 4.0 4.5 4.1  BUN 22*  < > 24* 31* 56* 30*  CREATININE 1.2*  < > 1.1 1.2* 1.5* 1.3*  TSH 3.74  --  3.02  --   --   --   < > = values in this interval not displayed.  CBC:  Recent Labs  02/10/14 12/27/14  WBC 7.6 12.5  HGB 12.3 11.6*  HCT 36 34*  PLT 292 387   Assessment/Plan Chronic kidney disease 01/04/15 Bun/creat 56/1.50 01/11/15 Bun/creat 30/1.32    Edema 12/18/13 LLE 1-2+, takes Maxzide 37.5/25mg  daily. LLE warmth, tenderness, erythematous, swelling-Venous Doppler to r/o DVT, increased Maxzide to daily 12/18/13. Venous Doppler LLE-negative for DVT 01/08/15 improved, 1+. Chronic venous dermatitis-itching, erythematous, lipo dermatosclerosis changes-will apply Mycolog II cream bid BLE  01/15/15 continue to improved-trace edema BLE      Hyponatremia Resolved.    Hypothyroidism Takes Synthroid 31mcg daily. TSH 2.900 04/05/13. 3.744 02/10/14. 3.025 11/01/14    Hypertension Controlled on Losartan 100mg  and  Metoprolol 50mg  bid, monitor Bp daily    GERD (gastroesophageal reflux disease) Stable, takes Omeprazole 20mg  daily.      Constipation Stable on Senokot S I nightly    Back pain, thoracic Multiple sites: OA in nature, continue  Ibuprofen 200mg  bid.    Depression off Mirtazapine 09/21/14 11/27/14 reportedly up and down and crying x1 night a few days ago.  01/08/15-not sleep at night-resume Mirtazapine 7.5mg  nightly.  01/15/15 improved.      Family/ Staff Communication: observe the patient  Goals of Care: SNF  Labs/tests ordered: none

## 2015-01-15 NOTE — Assessment & Plan Note (Signed)
01/04/15 Bun/creat 56/1.50 01/11/15 Bun/creat 30/1.32

## 2015-01-15 NOTE — Assessment & Plan Note (Signed)
Controlled on Losartan 100mg  and  Metoprolol 50mg  bid, monitor Bp daily

## 2015-01-15 NOTE — Assessment & Plan Note (Signed)
Stable on Senokot S I nightly

## 2015-01-15 NOTE — Assessment & Plan Note (Signed)
Takes Synthroid 65mcg daily. TSH 2.900 04/05/13. 3.744 02/10/14. 3.025 11/01/14

## 2015-01-15 NOTE — Assessment & Plan Note (Signed)
Resolved

## 2015-01-16 LAB — HEPATIC FUNCTION PANEL
ALT: 10 U/L (ref 7–35)
AST: 15 U/L (ref 13–35)
Alkaline Phosphatase: 130 U/L — AB (ref 25–125)
BILIRUBIN, TOTAL: 0.4 mg/dL

## 2015-01-16 LAB — BASIC METABOLIC PANEL
BUN: 33 mg/dL — AB (ref 4–21)
Creatinine: 1.5 mg/dL — AB (ref 0.5–1.1)
POTASSIUM: 4.2 mmol/L (ref 3.4–5.3)
Sodium: 137 mmol/L (ref 137–147)

## 2015-01-17 ENCOUNTER — Other Ambulatory Visit: Payer: Self-pay | Admitting: Nurse Practitioner

## 2015-01-17 DIAGNOSIS — R609 Edema, unspecified: Secondary | ICD-10-CM

## 2015-01-17 DIAGNOSIS — N183 Chronic kidney disease, stage 3 unspecified: Secondary | ICD-10-CM

## 2015-01-29 ENCOUNTER — Non-Acute Institutional Stay (SKILLED_NURSING_FACILITY): Payer: Medicare Other | Admitting: Nurse Practitioner

## 2015-01-29 DIAGNOSIS — F329 Major depressive disorder, single episode, unspecified: Secondary | ICD-10-CM

## 2015-01-29 DIAGNOSIS — G309 Alzheimer's disease, unspecified: Secondary | ICD-10-CM

## 2015-01-29 DIAGNOSIS — N182 Chronic kidney disease, stage 2 (mild): Secondary | ICD-10-CM

## 2015-01-29 DIAGNOSIS — R609 Edema, unspecified: Secondary | ICD-10-CM

## 2015-01-29 DIAGNOSIS — G4700 Insomnia, unspecified: Secondary | ICD-10-CM

## 2015-01-29 DIAGNOSIS — I10 Essential (primary) hypertension: Secondary | ICD-10-CM

## 2015-01-29 DIAGNOSIS — F0280 Dementia in other diseases classified elsewhere without behavioral disturbance: Secondary | ICD-10-CM

## 2015-01-29 DIAGNOSIS — E871 Hypo-osmolality and hyponatremia: Secondary | ICD-10-CM

## 2015-01-29 DIAGNOSIS — K5900 Constipation, unspecified: Secondary | ICD-10-CM

## 2015-01-29 DIAGNOSIS — E039 Hypothyroidism, unspecified: Secondary | ICD-10-CM

## 2015-01-29 DIAGNOSIS — K219 Gastro-esophageal reflux disease without esophagitis: Secondary | ICD-10-CM

## 2015-01-29 DIAGNOSIS — J209 Acute bronchitis, unspecified: Secondary | ICD-10-CM

## 2015-01-29 DIAGNOSIS — F32A Depression, unspecified: Secondary | ICD-10-CM

## 2015-01-29 NOTE — Assessment & Plan Note (Signed)
Controlled on Losartan 100mg  and  Metoprolol 50mg  bid, monitor Bp daily

## 2015-01-29 NOTE — Assessment & Plan Note (Signed)
off Mirtazapine 09/21/14 11/27/14 reportedly up and down and crying x1 night a few days ago.  01/08/15-not sleep at night-resume Mirtazapine 7.5mg  nightly.  --improved.

## 2015-01-29 NOTE — Assessment & Plan Note (Addendum)
Low grade T 100.3, 100.5, 99.7. Cough, rales, rhonchi-no O2 desaturation and denied chest pain. Will CXR to r/o PNA. Avelox 400mg  daily x 7 days and Mucinex 600mg  bid x 5 days, Neb . Observe.

## 2015-01-29 NOTE — Assessment & Plan Note (Signed)
Not sleep at night since off Mirtazapine 7.5mg  nightly, continue to observe.  Better since Mirtazapine resumed.

## 2015-01-29 NOTE — Progress Notes (Signed)
Patient ID: [name], female   DOB: Jun 12, 1913, 81 y.o.   MRN: [id number]   Code Status: DNR  No Known Allergies  Chief Complaint  Patient presents with  . Medical Management of Chronic Issues  . Acute Visit    coughing, T 100.3    HPI: Patient is a 81 y.o. female seen in the SNF at [facility] today for evaluation of cough, low grade T, BLE edema and chronic medical conditions.    ED eval 12/27/14 for dislocated the left 4th finger and multiple abrasions sustained from fall 12/27/14. CT head, maxi facial, neck. X-ray L+R shoulders, left forearm, left hand unremarkable.      Problem List Items Addressed This Visit    Insomnia (Chronic)    Not sleep at night since off Mirtazapine 7.5mg  nightly, continue to observe.  Better since Mirtazapine resumed.         Hypothyroidism    Takes Synthroid 42mcg daily. TSH 2.900 04/05/13. 3.744 02/10/14. 3.025 11/01/14        Hyponatremia    10/27/14 Na 131 11/01/14 Na 134 12/26/14 Na 133 12/27/14 Na 130 01/04/15 Na 136 01/11/15 Na 136       Hypertension    Controlled on Losartan 100mg  and  Metoprolol 50mg  bid, monitor Bp daily        GERD (gastroesophageal reflux disease)    Stable, takes Omeprazole 20mg  daily.        Edema (Chronic)    12/18/13 LLE 1-2+, takes Maxzide 37.5/25mg  daily. LLE warmth, tenderness, erythematous, swelling-Venous Doppler to r/o DVT, increased Maxzide to daily 12/18/13. Venous Doppler LLE-negative for DVT 01/08/15 improved, 1+. Chronic venous dermatitis-itching, erythematous, lipo dermatosclerosis changes-will apply Mycolog II cream bid BLE  01/15/15 continue to improved-trace edema BLE 01/17/15 Maxzide M-F-BMP 2 weeks scheduled.         Depression    off Mirtazapine 09/21/14 11/27/14 reportedly up and down and crying x1 night a few days ago.  01/08/15-not sleep at night-resume Mirtazapine 7.5mg  nightly.  --improved.        Constipation    Stable on Senokot S I nightly       Chronic  kidney disease    01/04/15 Bun/creat 56/1.50 01/11/15 Bun/creat 30/1.32        Alzheimer's disease    Baseline mentation now and no change since off  Namenda and Aricept 06/13/13.  MMSE 11/30 03/21/13. Observe the patient. Etiology of her confusion.         Acute bronchitis - Primary    Low grade T 100.3, 100.5, 99.7. Cough, rales, rhonchi-no O2 desaturation and denied chest pain. Will CXR to r/o PNA. Avelox 400mg  daily x 7 days and Mucinex 600mg  bid x 5 days, Neb . Observe.          Review of System:Review of Systems  Constitutional: Positive for fever and malaise/fatigue. Negative for chills, weight loss and diaphoresis.  HENT: Positive for hearing loss. Negative for congestion, ear discharge, ear pain, nosebleeds, sore throat and tinnitus.   Eyes: Negative for blurred vision, double vision, photophobia, pain and redness.  Respiratory: Positive for cough. Negative for hemoptysis, sputum production, shortness of breath, wheezing and stridor.   Cardiovascular: Positive for leg swelling and PND. Negative for chest pain, palpitations and orthopnea.  Gastrointestinal: Negative for heartburn, nausea, vomiting, abdominal pain, diarrhea, constipation, blood in stool and melena.  Genitourinary: Positive for frequency. Negative for dysuria, urgency, hematuria and flank pain.  Musculoskeletal: Positive for back pain and joint pain. Negative  for myalgias, falls and neck pain.  Skin: Negative for itching and rash.  Neurological: Negative for dizziness, tingling, tremors, sensory change, speech change, focal weakness, seizures, loss of consciousness, weakness and headaches.  Endo/Heme/Allergies: Negative for environmental allergies and polydipsia. Does not bruise/bleed easily.  Psychiatric/Behavioral: Positive for depression and memory loss. Negative for suicidal ideas, hallucinations and substance abuse. The patient is nervous/anxious and has insomnia.      Past Medical History  Diagnosis Date    . Spasm of muscle 11/2011  . Thyroid disease   . Anemia of other chronic disease 2012  . Anxiety   . Peripheral vascular disease, unspecified 2011  . Reflux esophagitis 2011  . Vomiting alone 2011  . Benign neoplasm of colon 2011  . Type II or unspecified type diabetes mellitus without mention of complication, not stated as uncontrolled 2011  . Unspecified vitamin D deficiency 2011  . Hyperlipidemia   . Depression   . Alzheimer's disease 2011  . Unspecified hearing loss 2011  . Stricture and stenosis of esophagus 2011  . Diaphragmatic hernia without mention of obstruction or gangrene 2011  . Diverticulosis of colon (without mention of hemorrhage) 2011  . Lumbago 2011  . Dizziness and giddiness 2011  . Other malaise and fatigue 2011  . Abnormality of gait 2011  . Senile dementia, uncomplicated 6144  . Hypertension    Past Surgical History  Procedure Laterality Date  . Tonsillectomy  1923  . Cataract extraction w/ intraocular lens  implant, bilateral  1985  . Retinal detachment surgery    . Esophageal dilation  2011  . Breast cyst aspiration     Social History:   reports that she has never smoked. She has never used smokeless tobacco. She reports that she does not drink alcohol or use illicit drugs.  Medications: Patient's Medications  New Prescriptions   No medications on file  Previous Medications   ACETAMINOPHEN (TYLENOL) 325 MG TABLET    Take 650 mg by mouth every 6 (six) hours as needed for mild pain or moderate pain.   CARBOXYMETHYLCELLUL-GLYCERIN (OPTIVE) 0.5-0.9 % SOLN    Apply 1 drop to eye 4 (four) times daily. Instill 1 drop in both eyes four times daily. *Wait 3-5 minutes between eye drops.*   CETIRIZINE (ZYRTEC) 5 MG TABLET    Take 5 mg by mouth daily as needed (itching).   LEVOTHYROXINE (SYNTHROID, LEVOTHROID) 25 MCG TABLET    Take 25 mcg by mouth daily before breakfast. *Check pulse weekly on Saturday.*   LOSARTAN (COZAAR) 100 MG TABLET    Take 100 mg by  mouth daily.   METOPROLOL (LOPRESSOR) 50 MG TABLET    Take 50 mg by mouth 2 (two) times daily. For blood pressure   OMEPRAZOLE (PRILOSEC) 20 MG CAPSULE    Take 20 mg by mouth daily. *Do Not Crush* Take on a empty stomach.   SACCHAROMYCES BOULARDII (FLORASTOR) 250 MG CAPSULE    Take 250 mg by mouth 2 (two) times daily. 14 day therapy patient to complete course on 01/03/2015   SENNOSIDES-DOCUSATE SODIUM (SENOKOT-S) 8.6-50 MG TABLET    Take 1 tablet by mouth at bedtime.   TRIAMTERENE-HYDROCHLOROTHIAZIDE (MAXZIDE-25) 37.5-25 MG PER TABLET    Take 1 tablet by mouth daily.   Modified Medications   No medications on file  Discontinued Medications   No medications on file     Physical Exam: Physical Exam  Constitutional: She is oriented to person, place, and time. She appears well-developed and well-nourished. No  distress.  HENT:  Head: Normocephalic and atraumatic.  Right Ear: External ear normal.  Left Ear: External ear normal.  Nose: Nose normal.  Mouth/Throat: Oropharynx is clear and moist. No oropharyngeal exudate.  Eyes: Conjunctivae and EOM are normal. Pupils are equal, round, and reactive to light. Right eye exhibits no discharge. Left eye exhibits no discharge. No scleral icterus.  Neck: Normal range of motion. Neck supple. No JVD present. No tracheal deviation present. No thyromegaly present.  Cardiovascular: Normal rate, regular rhythm and normal heart sounds.  Exam reveals no friction rub.   No murmur heard. Pulmonary/Chest: Effort normal. No stridor. No respiratory distress. She has wheezes. She has rales. She exhibits no tenderness.  Bibasilar moist rales. Rhonchi.   Abdominal: Soft. Bowel sounds are normal. She exhibits no distension. There is no tenderness. There is no rebound and no guarding.  Musculoskeletal: Normal range of motion. She exhibits edema. She exhibits no tenderness.  Trace edema RLE, 1+ edema left foot.  Ambulates with walker. 12/20/13 warmth, erythema, swelling,  and tenderness LLE   Lymphadenopathy:    She has no cervical adenopathy.  Neurological: She is alert and oriented to person, place, and time. She has normal reflexes. No cranial nerve deficit. She exhibits normal muscle tone. Coordination normal.  Skin: Skin is warm and dry. No rash noted. She is not diaphoretic. There is erythema. No pallor.  1+ BLE-erythema, tenderness, warmth-better Left 4th finger bind to the left 3rd finger due to dislocation-swelling and bruise apparent. Pain is minimal except uncomfortable to her patient.  Venous dermatitis appearance.     Psychiatric: She has a normal mood and affect. Her behavior is normal. Thought content normal. Cognition and memory are impaired. She expresses impulsivity and inappropriate judgment. She exhibits abnormal recent memory.    Filed Vitals:   01/29/15 1649  BP: 106/64  Pulse: 60  Temp: 99.7 F (37.6 C)  TempSrc: Tympanic  Resp: 20      Labs reviewed: Basic Metabolic Panel:  Recent Labs  02/10/14  11/01/14  01/04/15 01/11/15 01/16/15  NA 141  < > 134*  < > 136* 136* 137  K 3.8  < > 4.1  < > 4.5 4.1 4.2  BUN 22*  < > 24*  < > 56* 30* 33*  CREATININE 1.2*  < > 1.1  < > 1.5* 1.3* 1.5*  TSH 3.74  --  3.02  --   --   --   --   < > = values in this interval not displayed.  CBC:  Recent Labs  02/10/14 12/27/14  WBC 7.6 12.5  HGB 12.3 11.6*  HCT 36 34*  PLT 292 387   Assessment/Plan Acute bronchitis Low grade T 100.3, 100.5, 99.7. Cough, rales, rhonchi-no O2 desaturation and denied chest pain. Will CXR to r/o PNA. Avelox 400mg  daily x 7 days and Mucinex 600mg  bid x 5 days, Neb . Observe.    Insomnia Not sleep at night since off Mirtazapine 7.5mg  nightly, continue to observe.  Better since Mirtazapine resumed.      Edema 12/18/13 LLE 1-2+, takes Maxzide 37.5/25mg  daily. LLE warmth, tenderness, erythematous, swelling-Venous Doppler to r/o DVT, increased Maxzide to daily 12/18/13. Venous Doppler LLE-negative for  DVT 01/08/15 improved, 1+. Chronic venous dermatitis-itching, erythematous, lipo dermatosclerosis changes-will apply Mycolog II cream bid BLE  01/15/15 continue to improved-trace edema BLE 01/17/15 Maxzide M-F-BMP 2 weeks scheduled.      Hypertension Controlled on Losartan 100mg  and  Metoprolol 50mg  bid, monitor Bp daily  Depression off Mirtazapine 09/21/14 11/27/14 reportedly up and down and crying x1 night a few days ago.  01/08/15-not sleep at night-resume Mirtazapine 7.5mg  nightly.  --improved.     Alzheimer's disease Baseline mentation now and no change since off  Namenda and Aricept 06/13/13.  MMSE 11/30 03/21/13. Observe the patient. Etiology of her confusion.      Hypothyroidism Takes Synthroid 36mcg daily. TSH 2.900 04/05/13. 3.744 02/10/14. 3.025 11/01/14     Constipation Stable on Senokot S I nightly    GERD (gastroesophageal reflux disease) Stable, takes Omeprazole 20mg  daily.     Hyponatremia 10/27/14 Na 131 11/01/14 Na 134 12/26/14 Na 133 12/27/14 Na 130 01/04/15 Na 136 01/11/15 Na 136    Chronic kidney disease 01/04/15 Bun/creat 56/1.50 01/11/15 Bun/creat 30/1.32       Family/ Staff Communication: observe the patient  Goals of Care: SNF  Labs/tests ordered: CXR

## 2015-01-29 NOTE — Assessment & Plan Note (Signed)
12/18/13 LLE 1-2+, takes Maxzide 37.5/25mg  daily. LLE warmth, tenderness, erythematous, swelling-Venous Doppler to r/o DVT, increased Maxzide to daily 12/18/13. Venous Doppler LLE-negative for DVT 01/08/15 improved, 1+. Chronic venous dermatitis-itching, erythematous, lipo dermatosclerosis changes-will apply Mycolog II cream bid BLE  01/15/15 continue to improved-trace edema BLE 01/17/15 Maxzide M-F-BMP 2 weeks scheduled.

## 2015-01-29 NOTE — Assessment & Plan Note (Signed)
01/04/15 Bun/creat 56/1.50 01/11/15 Bun/creat 30/1.32

## 2015-01-29 NOTE — Assessment & Plan Note (Signed)
10/27/14 Na 131 11/01/14 Na 134 12/26/14 Na 133 12/27/14 Na 130 01/04/15 Na 136 01/11/15 Na 136

## 2015-01-29 NOTE — Assessment & Plan Note (Signed)
Stable on Senokot S I nightly

## 2015-01-29 NOTE — Assessment & Plan Note (Signed)
Stable, takes Omeprazole 20mg daily.  

## 2015-01-29 NOTE — Assessment & Plan Note (Signed)
Baseline mentation now and no change since off  Namenda and Aricept 06/13/13.  MMSE 11/30 03/21/13. Observe the patient. Etiology of her confusion.

## 2015-01-29 NOTE — Assessment & Plan Note (Signed)
Takes Synthroid 30mcg daily. TSH 2.900 04/05/13. 3.744 02/10/14. 3.025 11/01/14

## 2015-02-01 ENCOUNTER — Other Ambulatory Visit: Payer: Self-pay | Admitting: Nurse Practitioner

## 2015-02-01 DIAGNOSIS — N183 Chronic kidney disease, stage 3 unspecified: Secondary | ICD-10-CM

## 2015-02-01 DIAGNOSIS — E871 Hypo-osmolality and hyponatremia: Secondary | ICD-10-CM

## 2015-02-01 LAB — BASIC METABOLIC PANEL
BUN: 43 mg/dL — AB (ref 4–21)
CREATININE: 2 mg/dL — AB (ref 0.5–1.1)
Glucose: 141 mg/dL
Potassium: 3.7 mmol/L (ref 3.4–5.3)
SODIUM: 142 mmol/L (ref 137–147)

## 2015-03-07 ENCOUNTER — Non-Acute Institutional Stay (SKILLED_NURSING_FACILITY): Payer: Medicare Other | Admitting: Nurse Practitioner

## 2015-03-07 ENCOUNTER — Encounter: Payer: Self-pay | Admitting: Nurse Practitioner

## 2015-03-07 DIAGNOSIS — G4700 Insomnia, unspecified: Secondary | ICD-10-CM | POA: Diagnosis not present

## 2015-03-07 DIAGNOSIS — K5900 Constipation, unspecified: Secondary | ICD-10-CM | POA: Diagnosis not present

## 2015-03-07 DIAGNOSIS — G309 Alzheimer's disease, unspecified: Secondary | ICD-10-CM | POA: Diagnosis not present

## 2015-03-07 DIAGNOSIS — R609 Edema, unspecified: Secondary | ICD-10-CM | POA: Diagnosis not present

## 2015-03-07 DIAGNOSIS — K219 Gastro-esophageal reflux disease without esophagitis: Secondary | ICD-10-CM | POA: Diagnosis not present

## 2015-03-07 DIAGNOSIS — F329 Major depressive disorder, single episode, unspecified: Secondary | ICD-10-CM

## 2015-03-07 DIAGNOSIS — J209 Acute bronchitis, unspecified: Secondary | ICD-10-CM | POA: Diagnosis not present

## 2015-03-07 DIAGNOSIS — M546 Pain in thoracic spine: Secondary | ICD-10-CM | POA: Diagnosis not present

## 2015-03-07 DIAGNOSIS — I10 Essential (primary) hypertension: Secondary | ICD-10-CM | POA: Diagnosis not present

## 2015-03-07 DIAGNOSIS — E039 Hypothyroidism, unspecified: Secondary | ICD-10-CM | POA: Diagnosis not present

## 2015-03-07 DIAGNOSIS — N183 Chronic kidney disease, stage 3 unspecified: Secondary | ICD-10-CM

## 2015-03-07 DIAGNOSIS — E871 Hypo-osmolality and hyponatremia: Secondary | ICD-10-CM

## 2015-03-07 DIAGNOSIS — F0280 Dementia in other diseases classified elsewhere without behavioral disturbance: Secondary | ICD-10-CM

## 2015-03-07 DIAGNOSIS — F32A Depression, unspecified: Secondary | ICD-10-CM

## 2015-03-07 NOTE — Assessment & Plan Note (Signed)
Controlled on Losartan 100mg  and  Metoprolol 50mg  bid, monitor Bp daily

## 2015-03-07 NOTE — Assessment & Plan Note (Signed)
12/18/13 LLE 1-2+, takes Maxzide 37.5/25mg  daily. LLE warmth, tenderness, erythematous, swelling-Venous Doppler to r/o DVT, increased Maxzide to daily 12/18/13. Venous Doppler LLE-negative for DVT 01/08/15 improved, 1+. Chronic venous dermatitis-itching, erythematous, lipo dermatosclerosis changes-will apply Mycolog II cream bid BLE  01/15/15 continue to improved-trace edema BLE 01/17/15 Maxzide M-F

## 2015-03-07 NOTE — Assessment & Plan Note (Signed)
Baseline mentation now and no change since off  Namenda and Aricept 06/13/13.  MMSE 11/30 03/21/13. Observe the patient. Etiology of her confusion.

## 2015-03-07 NOTE — Progress Notes (Signed)
Patient ID: [name], female   DOB: [birth year], 81 y.o.   MRN: [id number]   Code Status: DNR  No Known Allergies  Chief Complaint  Patient presents with  . Medical Management of Chronic Issues    HPI: Patient is a 81 y.o. female seen in the SNF at [facility] today for evaluation of chronic medical conditions.  Problem List Items Addressed This Visit    Insomnia - Primary (Chronic)    Not sleep at night since off Mirtazapine 7.5mg  nightly, continue to observe.  Better since Mirtazapine resumed.        Edema (Chronic)    12/18/13 LLE 1-2+, takes Maxzide 37.5/25mg  daily. LLE warmth, tenderness, erythematous, swelling-Venous Doppler to r/o DVT, increased Maxzide to daily 12/18/13. Venous Doppler LLE-negative for DVT 01/08/15 improved, 1+. Chronic venous dermatitis-itching, erythematous, lipo dermatosclerosis changes-will apply Mycolog II cream bid BLE  01/15/15 continue to improved-trace edema BLE 01/17/15 Maxzide M-F        Hypertension    Controlled on Losartan 100mg  and  Metoprolol 50mg  bid, monitor Bp daily         Depression    off Mirtazapine 09/21/14 11/27/14 reportedly up and down and crying x1 night a few days ago.  01/08/15-not sleep at night-resume Mirtazapine 7.5mg  nightly.  --improved.         Alzheimer's disease    Baseline mentation now and no change since off  Namenda and Aricept 06/13/13.  MMSE 11/30 03/21/13. Observe the patient. Etiology of her confusion.          Back pain, thoracic    Multiple sites: OA in nature, continue Ibuprofen 200mg  bid.        Hypothyroidism    Takes Synthroid 82mcg daily. TSH 2.900 04/05/13. 3.744 02/10/14. 3.025 11/01/14        Constipation    Stable on Senokot S I nightly        GERD (gastroesophageal reflux disease)    Stable, takes Omeprazole 20mg  daily.         Hyponatremia    02/01/15 Na 142      Chronic kidney disease    02/01/15 Bun/creat 43/2.00      Acute bronchitis    Clinically  healed.          Review of Systems:  Review of Systems  Constitutional: Negative for fever, chills and diaphoresis.  HENT: Positive for hearing loss. Negative for congestion, ear discharge, ear pain, nosebleeds, sore throat and tinnitus.   Eyes: Negative for photophobia, pain and redness.  Respiratory: Negative for cough, shortness of breath, wheezing and stridor.   Cardiovascular: Positive for leg swelling. Negative for chest pain and palpitations.       Trace  Gastrointestinal: Negative for nausea, vomiting, abdominal pain, diarrhea, constipation and blood in stool.  Endocrine: Negative for polydipsia.  Genitourinary: Positive for frequency. Negative for dysuria, urgency, hematuria and flank pain.  Musculoskeletal: Positive for back pain. Negative for myalgias and neck pain.  Skin: Negative for rash.  Allergic/Immunologic: Negative for environmental allergies.  Neurological: Negative for dizziness, tremors, seizures, weakness and headaches.  Hematological: Does not bruise/bleed easily.  Psychiatric/Behavioral: Negative for suicidal ideas and hallucinations. The patient is nervous/anxious.      Past Medical History  Diagnosis Date  . Spasm of muscle 11/2011  . Thyroid disease   . Anemia of other chronic disease 2012  . Anxiety   . Peripheral vascular disease, unspecified 2011  . Reflux esophagitis 2011  . Vomiting alone 2011  .  Benign neoplasm of colon 2011  . Type II or unspecified type diabetes mellitus without mention of complication, not stated as uncontrolled 2011  . Unspecified vitamin D deficiency 2011  . Hyperlipidemia   . Depression   . Alzheimer's disease 2011  . Unspecified hearing loss 2011  . Stricture and stenosis of esophagus 2011  . Diaphragmatic hernia without mention of obstruction or gangrene 2011  . Diverticulosis of colon (without mention of hemorrhage) 2011  . Lumbago 2011  . Dizziness and giddiness 2011  . Other malaise and fatigue 2011  .  Abnormality of gait 2011  . Senile dementia, uncomplicated 7829  . Hypertension    Past Surgical History  Procedure Laterality Date  . Tonsillectomy  1923  . Cataract extraction w/ intraocular lens  implant, bilateral  1985  . Retinal detachment surgery    . Esophageal dilation  2011  . Breast cyst aspiration     Social History:   reports that she has never smoked. She has never used smokeless tobacco. She reports that she does not drink alcohol or use illicit drugs.  No family history on file.  Medications: Patient's Medications  New Prescriptions   No medications on file  Previous Medications   ACETAMINOPHEN (TYLENOL) 325 MG TABLET    Take 650 mg by mouth every 6 (six) hours as needed for mild pain or moderate pain.   CARBOXYMETHYLCELLUL-GLYCERIN (OPTIVE) 0.5-0.9 % SOLN    Apply 1 drop to eye 4 (four) times daily. Instill 1 drop in both eyes four times daily. *Wait 3-5 minutes between eye drops.*   CETIRIZINE (ZYRTEC) 5 MG TABLET    Take 5 mg by mouth daily as needed (itching).   LEVOTHYROXINE (SYNTHROID, LEVOTHROID) 25 MCG TABLET    Take 25 mcg by mouth daily before breakfast. *Check pulse weekly on Saturday.*   LOSARTAN (COZAAR) 100 MG TABLET    Take 100 mg by mouth daily.   METOPROLOL (LOPRESSOR) 50 MG TABLET    Take 50 mg by mouth 2 (two) times daily. For blood pressure   OMEPRAZOLE (PRILOSEC) 20 MG CAPSULE    Take 20 mg by mouth daily. *Do Not Crush* Take on a empty stomach.   SACCHAROMYCES BOULARDII (FLORASTOR) 250 MG CAPSULE    Take 250 mg by mouth 2 (two) times daily. 14 day therapy patient to complete course on 01/03/2015   SENNOSIDES-DOCUSATE SODIUM (SENOKOT-S) 8.6-50 MG TABLET    Take 1 tablet by mouth at bedtime.   TRIAMTERENE-HYDROCHLOROTHIAZIDE (MAXZIDE-25) 37.5-25 MG PER TABLET    Take 1 tablet by mouth daily.   Modified Medications   No medications on file  Discontinued Medications   No medications on file     Physical Exam: Physical Exam  Constitutional: She  is oriented to person, place, and time. She appears well-developed and well-nourished. No distress.  HENT:  Head: Normocephalic and atraumatic.  Right Ear: External ear normal.  Left Ear: External ear normal.  Nose: Nose normal.  Mouth/Throat: Oropharynx is clear and moist. No oropharyngeal exudate.  Eyes: Conjunctivae and EOM are normal. Pupils are equal, round, and reactive to light. Right eye exhibits no discharge. Left eye exhibits no discharge. No scleral icterus.  Neck: Normal range of motion. Neck supple. No JVD present. No tracheal deviation present. No thyromegaly present.  Cardiovascular: Normal rate, regular rhythm and normal heart sounds.  Exam reveals no friction rub.   No murmur heard. Pulmonary/Chest: Effort normal. No stridor. No respiratory distress. She has no wheezes. She has rales.  She exhibits no tenderness.  Bibasilar moist rales.  Abdominal: Soft. Bowel sounds are normal. She exhibits no distension. There is no tenderness. There is no rebound and no guarding.  Musculoskeletal: Normal range of motion. She exhibits edema. She exhibits no tenderness.  Trace edema RLE, 1+ edema left foot.  Ambulates with walker.    Lymphadenopathy:    She has no cervical adenopathy.  Neurological: She is alert and oriented to person, place, and time. She has normal reflexes. No cranial nerve deficit. She exhibits normal muscle tone. Coordination normal.  Skin: Skin is warm and dry. No rash noted. She is not diaphoretic. There is erythema. No pallor.  Venous dermatitis appearance BLE    Psychiatric: She has a normal mood and affect. Her behavior is normal. Thought content normal. Cognition and memory are impaired. She expresses impulsivity and inappropriate judgment. She exhibits abnormal recent memory.    Filed Vitals:   03/07/15 1305  BP: 150/80  Pulse: 70  Temp: 97.8 F (36.6 C)  TempSrc: Tympanic  Resp: 16      Labs reviewed: Basic Metabolic Panel:  Recent Labs   11/01/14  01/11/15 01/16/15 02/01/15  NA 134*  < > 136* 137 142  K 4.1  < > 4.1 4.2 3.7  BUN 24*  < > 30* 33* 43*  CREATININE 1.1  < > 1.3* 1.5* 2.0*  TSH 3.02  --   --   --   --   < > = values in this interval not displayed. Liver Function Tests:  Recent Labs  01/11/15 01/16/15  AST 15 15  ALT 11 10  ALKPHOS 127* 130*   No results for input(s): LIPASE, AMYLASE in the last 8760 hours. No results for input(s): AMMONIA in the last 8760 hours. CBC:  Recent Labs  12/27/14  WBC 12.5  HGB 11.6*  HCT 34*  PLT 387   Lipid Panel: No results for input(s): CHOL, HDL, LDLCALC, TRIG, CHOLHDL, LDLDIRECT in the last 8760 hours.  Past Procedures:  12/27/14 X-ray R+L shoulders, L forearm and hands: no evidence of acute fractures or dislocation 12/27/14 CT head, cervical spine, and maxillofacial bones.   IMPRESSION: 1. No evidence of traumatic intracranial injury or fracture. 2. No evidence of fracture or dislocation with regard to the maxillofacial structures. 3. No evidence of acute fracture or subluxation along the cervical spine. 4. Soft tissue swelling overlying the left frontoparietal calvarium. 5. Moderately severe cortical volume loss and scattered small vessel ischemic microangiopathy. 6. Mild degenerative change noted along the cervical spine. 7. Scattered calcification about the left mandibular condylar head, concerning for chronic left temporomandibular joint disease. 8. 1.6 cm hypodensity at the left thyroid lobe. Consider further evaluation with thyroid ultrasound. If patient is clinically hyperthyroid, consider nuclear medicine thyroid uptake and scan. 9. Dense calcification at the carotid bifurcations bilaterally. Carotid ultrasound would be helpful for further evaluation, when and as deemed clinically appropriate.   Assessment/Plan Insomnia Not sleep at night since off Mirtazapine 7.5mg  nightly, continue to observe.  Better since Mirtazapine resumed.      Edema 12/18/13 LLE 1-2+, takes Maxzide 37.5/25mg  daily. LLE warmth, tenderness, erythematous, swelling-Venous Doppler to r/o DVT, increased Maxzide to daily 12/18/13. Venous Doppler LLE-negative for DVT 01/08/15 improved, 1+. Chronic venous dermatitis-itching, erythematous, lipo dermatosclerosis changes-will apply Mycolog II cream bid BLE  01/15/15 continue to improved-trace edema BLE 01/17/15 Maxzide M-F     Hypertension Controlled on Losartan 100mg  and  Metoprolol 50mg  bid, monitor Bp daily  Depression off Mirtazapine 09/21/14 11/27/14 reportedly up and down and crying x1 night a few days ago.  01/08/15-not sleep at night-resume Mirtazapine 7.5mg  nightly.  --improved.      Alzheimer's disease Baseline mentation now and no change since off  Namenda and Aricept 06/13/13.  MMSE 11/30 03/21/13. Observe the patient. Etiology of her confusion.       Back pain, thoracic Multiple sites: OA in nature, continue Ibuprofen 200mg  bid.     Hypothyroidism Takes Synthroid 9mcg daily. TSH 2.900 04/05/13. 3.744 02/10/14. 3.025 11/01/14     Constipation Stable on Senokot S I nightly     GERD (gastroesophageal reflux disease) Stable, takes Omeprazole 20mg  daily.      Hyponatremia 02/01/15 Na 142   Chronic kidney disease 02/01/15 Bun/creat 43/2.00   Acute bronchitis Clinically healed.      Family/ Staff Communication: observe the patient  Goals of Care: SNF  Labs/tests ordered: none

## 2015-03-07 NOTE — Assessment & Plan Note (Signed)
Clinically healed.

## 2015-03-07 NOTE — Assessment & Plan Note (Signed)
02/01/15 Bun/creat 43/2.00

## 2015-03-07 NOTE — Assessment & Plan Note (Signed)
Stable on Senokot S I nightly

## 2015-03-07 NOTE — Assessment & Plan Note (Signed)
Multiple sites: OA in nature, continue Ibuprofen 200mg  bid.

## 2015-03-07 NOTE — Assessment & Plan Note (Signed)
off Mirtazapine 09/21/14 11/27/14 reportedly up and down and crying x1 night a few days ago.  01/08/15-not sleep at night-resume Mirtazapine 7.5mg  nightly.  --improved.

## 2015-03-07 NOTE — Assessment & Plan Note (Signed)
Not sleep at night since off Mirtazapine 7.5mg  nightly, continue to observe.  Better since Mirtazapine resumed.

## 2015-03-07 NOTE — Assessment & Plan Note (Signed)
02/01/15 Na 142

## 2015-03-07 NOTE — Assessment & Plan Note (Signed)
Stable, takes Omeprazole 20mg daily.  

## 2015-03-07 NOTE — Assessment & Plan Note (Signed)
Takes Synthroid 70mcg daily. TSH 2.900 04/05/13. 3.744 02/10/14. 3.025 11/01/14

## 2015-04-09 ENCOUNTER — Encounter: Payer: Self-pay | Admitting: Nurse Practitioner

## 2015-04-09 ENCOUNTER — Non-Acute Institutional Stay (SKILLED_NURSING_FACILITY): Payer: Medicare Other | Admitting: Nurse Practitioner

## 2015-04-09 DIAGNOSIS — M546 Pain in thoracic spine: Secondary | ICD-10-CM

## 2015-04-09 DIAGNOSIS — E871 Hypo-osmolality and hyponatremia: Secondary | ICD-10-CM | POA: Diagnosis not present

## 2015-04-09 DIAGNOSIS — G4700 Insomnia, unspecified: Secondary | ICD-10-CM | POA: Diagnosis not present

## 2015-04-09 DIAGNOSIS — F329 Major depressive disorder, single episode, unspecified: Secondary | ICD-10-CM

## 2015-04-09 DIAGNOSIS — G309 Alzheimer's disease, unspecified: Secondary | ICD-10-CM

## 2015-04-09 DIAGNOSIS — F0280 Dementia in other diseases classified elsewhere without behavioral disturbance: Secondary | ICD-10-CM

## 2015-04-09 DIAGNOSIS — R609 Edema, unspecified: Secondary | ICD-10-CM | POA: Diagnosis not present

## 2015-04-09 DIAGNOSIS — F32A Depression, unspecified: Secondary | ICD-10-CM

## 2015-04-09 DIAGNOSIS — J209 Acute bronchitis, unspecified: Secondary | ICD-10-CM

## 2015-04-09 DIAGNOSIS — I10 Essential (primary) hypertension: Secondary | ICD-10-CM | POA: Diagnosis not present

## 2015-04-09 DIAGNOSIS — E039 Hypothyroidism, unspecified: Secondary | ICD-10-CM

## 2015-04-09 DIAGNOSIS — K219 Gastro-esophageal reflux disease without esophagitis: Secondary | ICD-10-CM

## 2015-04-09 DIAGNOSIS — N182 Chronic kidney disease, stage 2 (mild): Secondary | ICD-10-CM

## 2015-04-09 DIAGNOSIS — K5900 Constipation, unspecified: Secondary | ICD-10-CM | POA: Diagnosis not present

## 2015-04-09 NOTE — Assessment & Plan Note (Signed)
Stable, takes Omeprazole 20mg daily.  

## 2015-04-09 NOTE — Assessment & Plan Note (Signed)
Controlled on Losartan 100mg  and  Metoprolol 50mg  bid, monitor Bp daily

## 2015-04-09 NOTE — Assessment & Plan Note (Signed)
12/18/13 LLE 1-2+, takes Maxzide 37.5/25mg  daily. LLE warmth, tenderness, erythematous, swelling-Venous Doppler to r/o DVT, increased Maxzide to daily 12/18/13. Venous Doppler LLE-negative for DVT 01/08/15 improved, 1+. Chronic venous dermatitis-itching, erythematous, lipo dermatosclerosis changes-will apply Mycolog II cream bid BLE  01/15/15 continue to improved-trace edema BLE No change, takes Maxzide M-F

## 2015-04-09 NOTE — Assessment & Plan Note (Signed)
Last Na 142 02/01/15

## 2015-04-09 NOTE — Assessment & Plan Note (Signed)
Stable on Senokot S I nightly

## 2015-04-09 NOTE — Assessment & Plan Note (Signed)
Resolved

## 2015-04-09 NOTE — Assessment & Plan Note (Signed)
Multiple sites: OA in nature, continue Ibuprofen 200mg  bid.

## 2015-04-09 NOTE — Assessment & Plan Note (Signed)
off Mirtazapine 09/21/14 11/27/14 reportedly up and down and crying x1 night a few days ago.  01/08/15-not sleep at night-resume Mirtazapine 7.5mg  nightly.  -stabilized.

## 2015-04-09 NOTE — Assessment & Plan Note (Signed)
Takes Synthroid 62mcg daily. TSH 2.900 04/05/13. 3.744 02/10/14. 3.025 11/01/14

## 2015-04-09 NOTE — Assessment & Plan Note (Signed)
Not sleep at night since off Mirtazapine 7.5mg  nightly, continue to observe.  Better since Mirtazapine resumed.

## 2015-04-09 NOTE — Progress Notes (Signed)
Patient ID: [name], female   DOB: August 18, 1913, 81 y.o.   MRN: [id number]   Code Status: DNR  No Known Allergies  Chief Complaint  Patient presents with  . Medical Management of Chronic Issues    HPI: Patient is a 81 y.o. female seen in the SNF at [facility] today for evaluation of chronic medical conditions.  Problem List Items Addressed This Visit    Insomnia (Chronic)    Not sleep at night since off Mirtazapine 7.5mg  nightly, continue to observe.  Better since Mirtazapine resumed.         Edema (Chronic)    12/18/13 LLE 1-2+, takes Maxzide 37.5/25mg  daily. LLE warmth, tenderness, erythematous, swelling-Venous Doppler to r/o DVT, increased Maxzide to daily 12/18/13. Venous Doppler LLE-negative for DVT 01/08/15 improved, 1+. Chronic venous dermatitis-itching, erythematous, lipo dermatosclerosis changes-will apply Mycolog II cream bid BLE  01/15/15 continue to improved-trace edema BLE No change, takes Maxzide M-F        Hypertension - Primary    Controlled on Losartan 100mg  and  Metoprolol 50mg  bid, monitor Bp daily         Depression    off Mirtazapine 09/21/14 11/27/14 reportedly up and down and crying x1 night a few days ago.  01/08/15-not sleep at night-resume Mirtazapine 7.5mg  nightly.  -stabilized.         Alzheimer's disease    Baseline mentation now and no change since off  Namenda and Aricept 06/13/13.  MMSE 11/30 03/21/13. Observe the patient. Etiology of her confusion.           Back pain, thoracic    Multiple sites: OA in nature, continue Ibuprofen 200mg  bid.       Hypothyroidism    Takes Synthroid 48mcg daily. TSH 2.900 04/05/13. 3.744 02/10/14. 3.025 11/01/14       Constipation    Stable on Senokot S I nightly       GERD (gastroesophageal reflux disease)    Stable, takes Omeprazole 20mg  daily.        Hyponatremia    Last Na 142 02/01/15      Chronic kidney disease    02/01/15 Bun/creat 43/2.00       Acute bronchitis   Resolved.          Review of Systems:  Review of Systems  Constitutional: Negative for fever, chills and diaphoresis.  HENT: Positive for hearing loss. Negative for congestion, ear discharge, ear pain, nosebleeds, sore throat and tinnitus.   Eyes: Negative for photophobia, pain and redness.  Respiratory: Negative for cough, shortness of breath, wheezing and stridor.   Cardiovascular: Positive for leg swelling. Negative for chest pain and palpitations.       Trace on and off  Gastrointestinal: Negative for nausea, vomiting, abdominal pain, diarrhea, constipation and blood in stool.  Endocrine: Negative for polydipsia.  Genitourinary: Positive for frequency. Negative for dysuria, urgency, hematuria and flank pain.  Musculoskeletal: Positive for back pain. Negative for myalgias and neck pain.  Skin: Negative for rash.  Allergic/Immunologic: Negative for environmental allergies.  Neurological: Negative for dizziness, tremors, seizures, weakness and headaches.  Hematological: Does not bruise/bleed easily.  Psychiatric/Behavioral: Negative for suicidal ideas and hallucinations. The patient is nervous/anxious.      Past Medical History  Diagnosis Date  . Spasm of muscle 11/2011  . Thyroid disease   . Anemia of other chronic disease 2012  . Anxiety   . Peripheral vascular disease, unspecified 2011  . Reflux esophagitis 2011  . Vomiting  alone 2011  . Benign neoplasm of colon 2011  . Type II or unspecified type diabetes mellitus without mention of complication, not stated as uncontrolled 2011  . Unspecified vitamin D deficiency 2011  . Hyperlipidemia   . Depression   . Alzheimer's disease 2011  . Unspecified hearing loss 2011  . Stricture and stenosis of esophagus 2011  . Diaphragmatic hernia without mention of obstruction or gangrene 2011  . Diverticulosis of colon (without mention of hemorrhage) 2011  . Lumbago 2011  . Dizziness and giddiness 2011  . Other malaise and fatigue  2011  . Abnormality of gait 2011  . Senile dementia, uncomplicated 6606  . Hypertension    Past Surgical History  Procedure Laterality Date  . Tonsillectomy  1923  . Cataract extraction w/ intraocular lens  implant, bilateral  1985  . Retinal detachment surgery    . Esophageal dilation  2011  . Breast cyst aspiration     Social History:   reports that she has never smoked. She has never used smokeless tobacco. She reports that she does not drink alcohol or use illicit drugs.  No family history on file.  Medications: Patient's Medications  New Prescriptions   No medications on file  Previous Medications   ACETAMINOPHEN (TYLENOL) 325 MG TABLET    Take 650 mg by mouth every 6 (six) hours as needed for mild pain or moderate pain.   CARBOXYMETHYLCELLUL-GLYCERIN (OPTIVE) 0.5-0.9 % SOLN    Apply 1 drop to eye 4 (four) times daily. Instill 1 drop in both eyes four times daily. *Wait 3-5 minutes between eye drops.*   CETIRIZINE (ZYRTEC) 5 MG TABLET    Take 5 mg by mouth daily as needed (itching).   LEVOTHYROXINE (SYNTHROID, LEVOTHROID) 25 MCG TABLET    Take 25 mcg by mouth daily before breakfast. *Check pulse weekly on Saturday.*   LOSARTAN (COZAAR) 100 MG TABLET    Take 100 mg by mouth daily.   METOPROLOL (LOPRESSOR) 50 MG TABLET    Take 50 mg by mouth 2 (two) times daily. For blood pressure   OMEPRAZOLE (PRILOSEC) 20 MG CAPSULE    Take 20 mg by mouth daily. *Do Not Crush* Take on a empty stomach.   SACCHAROMYCES BOULARDII (FLORASTOR) 250 MG CAPSULE    Take 250 mg by mouth 2 (two) times daily. 14 day therapy patient to complete course on 01/03/2015   SENNOSIDES-DOCUSATE SODIUM (SENOKOT-S) 8.6-50 MG TABLET    Take 1 tablet by mouth at bedtime.   TRIAMTERENE-HYDROCHLOROTHIAZIDE (MAXZIDE-25) 37.5-25 MG PER TABLET    Take 1 tablet by mouth daily.   Modified Medications   No medications on file  Discontinued Medications   No medications on file     Physical Exam: Physical Exam    Constitutional: She is oriented to person, place, and time. She appears well-developed and well-nourished. No distress.  HENT:  Head: Normocephalic and atraumatic.  Right Ear: External ear normal.  Left Ear: External ear normal.  Nose: Nose normal.  Mouth/Throat: Oropharynx is clear and moist. No oropharyngeal exudate.  Eyes: Conjunctivae and EOM are normal. Pupils are equal, round, and reactive to light. Right eye exhibits no discharge. Left eye exhibits no discharge. No scleral icterus.  Neck: Normal range of motion. Neck supple. No JVD present. No tracheal deviation present. No thyromegaly present.  Cardiovascular: Normal rate, regular rhythm and normal heart sounds.  Exam reveals no friction rub.   No murmur heard. Pulmonary/Chest: Effort normal. No stridor. No respiratory distress. She has  no wheezes. She has rales. She exhibits no tenderness.  Bibasilar moist rales.  Abdominal: Soft. Bowel sounds are normal. She exhibits no distension. There is no tenderness. There is no rebound and no guarding.  Musculoskeletal: Normal range of motion. She exhibits edema. She exhibits no tenderness.  Trace edema BLE on and off   Lymphadenopathy:    She has no cervical adenopathy.  Neurological: She is alert and oriented to person, place, and time. She has normal reflexes. No cranial nerve deficit. She exhibits normal muscle tone. Coordination normal.  Skin: Skin is warm and dry. No rash noted. She is not diaphoretic. There is erythema. No pallor.  Venous dermatitis appearance BLE    Psychiatric: She has a normal mood and affect. Her behavior is normal. Thought content normal. Cognition and memory are impaired. She expresses impulsivity and inappropriate judgment. She exhibits abnormal recent memory.    Filed Vitals:   04/09/15 1452  BP: 136/64  Pulse: 64  Temp: 98.2 F (36.8 C)  TempSrc: Tympanic  Resp: 18      Labs reviewed: Basic Metabolic Panel:  Recent Labs  11/01/14  01/11/15  01/16/15 02/01/15  NA 134*  < > 136* 137 142  K 4.1  < > 4.1 4.2 3.7  BUN 24*  < > 30* 33* 43*  CREATININE 1.1  < > 1.3* 1.5* 2.0*  TSH 3.02  --   --   --   --   < > = values in this interval not displayed. Liver Function Tests:  Recent Labs  01/11/15 01/16/15  AST 15 15  ALT 11 10  ALKPHOS 127* 130*   No results for input(s): LIPASE, AMYLASE in the last 8760 hours. No results for input(s): AMMONIA in the last 8760 hours. CBC:  Recent Labs  12/27/14  WBC 12.5  HGB 11.6*  HCT 34*  PLT 387   Lipid Panel: No results for input(s): CHOL, HDL, LDLCALC, TRIG, CHOLHDL, LDLDIRECT in the last 8760 hours.  Past Procedures:  12/27/14 X-ray R+L shoulders, L forearm and hands: no evidence of acute fractures or dislocation 12/27/14 CT head, cervical spine, and maxillofacial bones.   IMPRESSION: 1. No evidence of traumatic intracranial injury or fracture. 2. No evidence of fracture or dislocation with regard to the maxillofacial structures. 3. No evidence of acute fracture or subluxation along the cervical spine. 4. Soft tissue swelling overlying the left frontoparietal calvarium. 5. Moderately severe cortical volume loss and scattered small vessel ischemic microangiopathy. 6. Mild degenerative change noted along the cervical spine. 7. Scattered calcification about the left mandibular condylar head, concerning for chronic left temporomandibular joint disease. 8. 1.6 cm hypodensity at the left thyroid lobe. Consider further evaluation with thyroid ultrasound. If patient is clinically hyperthyroid, consider nuclear medicine thyroid uptake and scan. 9. Dense calcification at the carotid bifurcations bilaterally. Carotid ultrasound would be helpful for further evaluation, when and as deemed clinically appropriate.   Assessment/Plan Insomnia Not sleep at night since off Mirtazapine 7.5mg  nightly, continue to observe.  Better since Mirtazapine resumed.      Edema 12/18/13 LLE  1-2+, takes Maxzide 37.5/25mg  daily. LLE warmth, tenderness, erythematous, swelling-Venous Doppler to r/o DVT, increased Maxzide to daily 12/18/13. Venous Doppler LLE-negative for DVT 01/08/15 improved, 1+. Chronic venous dermatitis-itching, erythematous, lipo dermatosclerosis changes-will apply Mycolog II cream bid BLE  01/15/15 continue to improved-trace edema BLE No change, takes Maxzide M-F     Hypertension Controlled on Losartan 100mg  and  Metoprolol 50mg  bid, monitor Bp daily  Depression off Mirtazapine 09/21/14 11/27/14 reportedly up and down and crying x1 night a few days ago.  01/08/15-not sleep at night-resume Mirtazapine 7.5mg  nightly.  -stabilized.      Alzheimer's disease Baseline mentation now and no change since off  Namenda and Aricept 06/13/13.  MMSE 11/30 03/21/13. Observe the patient. Etiology of her confusion.        Back pain, thoracic Multiple sites: OA in nature, continue Ibuprofen 200mg  bid.    Hypothyroidism Takes Synthroid 44mcg daily. TSH 2.900 04/05/13. 3.744 02/10/14. 3.025 11/01/14    Constipation Stable on Senokot S I nightly    GERD (gastroesophageal reflux disease) Stable, takes Omeprazole 20mg  daily.     Hyponatremia Last Na 142 02/01/15   Chronic kidney disease 02/01/15 Bun/creat 43/2.00    Acute bronchitis Resolved.      Family/ Staff Communication: observe the patient  Goals of Care: SNF  Labs/tests ordered: none

## 2015-04-09 NOTE — Assessment & Plan Note (Signed)
Baseline mentation now and no change since off  Namenda and Aricept 06/13/13.  MMSE 11/30 03/21/13. Observe the patient. Etiology of her confusion.

## 2015-04-09 NOTE — Assessment & Plan Note (Signed)
02/01/15 Bun/creat 43/2.00

## 2015-04-17 LAB — HEPATIC FUNCTION PANEL
ALT: 8 U/L (ref 7–35)
AST: 8 U/L — AB (ref 13–35)
Alkaline Phosphatase: 145 U/L — AB (ref 25–125)
Bilirubin, Total: 0.3 mg/dL

## 2015-04-17 LAB — BASIC METABOLIC PANEL
BUN: 54 mg/dL — AB (ref 4–21)
Creatinine: 1.8 mg/dL — AB (ref 0.5–1.1)
GLUCOSE: 79 mg/dL
Potassium: 4 mmol/L (ref 3.4–5.3)
SODIUM: 136 mmol/L — AB (ref 137–147)

## 2015-04-17 LAB — CBC AND DIFFERENTIAL
HEMATOCRIT: 31 % — AB (ref 36–46)
Hemoglobin: 10.1 g/dL — AB (ref 12.0–16.0)
WBC: 20.8 10^3/mL

## 2015-04-18 ENCOUNTER — Non-Acute Institutional Stay (SKILLED_NURSING_FACILITY): Payer: Medicare Other | Admitting: Nurse Practitioner

## 2015-04-18 ENCOUNTER — Encounter: Payer: Self-pay | Admitting: Nurse Practitioner

## 2015-04-18 DIAGNOSIS — K5900 Constipation, unspecified: Secondary | ICD-10-CM | POA: Diagnosis not present

## 2015-04-18 DIAGNOSIS — G309 Alzheimer's disease, unspecified: Secondary | ICD-10-CM

## 2015-04-18 DIAGNOSIS — R197 Diarrhea, unspecified: Secondary | ICD-10-CM | POA: Diagnosis not present

## 2015-04-18 DIAGNOSIS — F329 Major depressive disorder, single episode, unspecified: Secondary | ICD-10-CM | POA: Diagnosis not present

## 2015-04-18 DIAGNOSIS — F0280 Dementia in other diseases classified elsewhere without behavioral disturbance: Secondary | ICD-10-CM

## 2015-04-18 DIAGNOSIS — I10 Essential (primary) hypertension: Secondary | ICD-10-CM | POA: Diagnosis not present

## 2015-04-18 DIAGNOSIS — D72829 Elevated white blood cell count, unspecified: Secondary | ICD-10-CM

## 2015-04-18 DIAGNOSIS — N182 Chronic kidney disease, stage 2 (mild): Secondary | ICD-10-CM

## 2015-04-18 DIAGNOSIS — E039 Hypothyroidism, unspecified: Secondary | ICD-10-CM

## 2015-04-18 DIAGNOSIS — F32A Depression, unspecified: Secondary | ICD-10-CM

## 2015-04-18 DIAGNOSIS — K219 Gastro-esophageal reflux disease without esophagitis: Secondary | ICD-10-CM

## 2015-04-18 DIAGNOSIS — R609 Edema, unspecified: Secondary | ICD-10-CM

## 2015-04-18 NOTE — Assessment & Plan Note (Signed)
off Mirtazapine 09/21/14 11/27/14 reportedly up and down and crying x1 night a few days ago.  01/08/15-not sleep at night-resume Mirtazapine 7.5mg  nightly.  -stabilized.

## 2015-04-18 NOTE — Assessment & Plan Note (Signed)
Low measurement noted today. Hold Maxzide may help. Continue Losartan and Metoprolol.

## 2015-04-18 NOTE — Assessment & Plan Note (Signed)
Takes Synthroid 73mcg daily. TSH 3.025 11/01/14

## 2015-04-18 NOTE — Progress Notes (Signed)
Patient ID: [name], female   DOB: 07-16-1913, 81 y.o.   MRN: [id number]   Code Status: DNR  No Known Allergies  Chief Complaint  Patient presents with  . Medical Management of Chronic Issues  . Acute Visit    diarrhea    HPI: Patient is a 81 y.o. female seen in the SNF at [facility] today for evaluation of diarrhea and chronic medical conditions.  Problem List Items Addressed This Visit    Edema - Primary (Chronic)    Not apparent-hold Maxzide in setting of diarrhea.       Hypertension    Low measurement noted today. Hold Maxzide may help. Continue Losartan and Metoprolol.       Depression    off Mirtazapine 09/21/14 11/27/14 reportedly up and down and crying x1 night a few days ago.  01/08/15-not sleep at night-resume Mirtazapine 7.5mg  nightly.  -stabilized.       Alzheimer's disease    Baseline mentation now and no change since off  Namenda and Aricept 06/13/13.  MMSE 11/30 03/21/13. Observe the patient. Etiology of her confusion.         Hypothyroidism    Takes Synthroid 21mcg daily. TSH 3.025 11/01/14      Constipation    Diarrhea x 2 days-hold Senokot.       GERD (gastroesophageal reflux disease)    Stable, takes Omeprazole 20mg  daily.        Chronic kidney disease    04/17/15 Bun/creat 54/1.77--hold Maxzide for now      Diarrhea    X 2 days, afebrile, but wbc 20.8. Hold Maxzide and Senokot for now, check C-diff Toxin A/B x3. Prn Imodium for now. Observe the patient.       Leukocytosis    04/17/15 wbc 20.8, afebrile, diarrhea x 2 days-C-diff pending, observe          Review of Systems:  Review of Systems  Constitutional: Negative for fever, chills and diaphoresis.  HENT: Positive for hearing loss. Negative for congestion, ear discharge, ear pain, nosebleeds, sore throat and tinnitus.   Eyes: Negative for photophobia, pain and redness.  Respiratory: Negative for cough, shortness of breath, wheezing and stridor.   Cardiovascular:  Negative for chest pain, palpitations and leg swelling.       Trace on and off  Gastrointestinal: Positive for diarrhea. Negative for nausea, vomiting, abdominal pain, constipation and blood in stool.  Endocrine: Negative for polydipsia.  Genitourinary: Positive for frequency. Negative for dysuria, urgency, hematuria and flank pain.  Musculoskeletal: Positive for back pain. Negative for myalgias and neck pain.  Skin: Negative for rash.  Allergic/Immunologic: Negative for environmental allergies.  Neurological: Negative for dizziness, tremors, seizures, weakness and headaches.  Hematological: Does not bruise/bleed easily.  Psychiatric/Behavioral: Negative for suicidal ideas and hallucinations. The patient is nervous/anxious.      Past Medical History  Diagnosis Date  . Spasm of muscle 11/2011  . Thyroid disease   . Anemia of other chronic disease 2012  . Anxiety   . Peripheral vascular disease, unspecified 2011  . Reflux esophagitis 2011  . Vomiting alone 2011  . Benign neoplasm of colon 2011  . Type II or unspecified type diabetes mellitus without mention of complication, not stated as uncontrolled 2011  . Unspecified vitamin D deficiency 2011  . Hyperlipidemia   . Depression   . Alzheimer's disease 2011  . Unspecified hearing loss 2011  . Stricture and stenosis of esophagus 2011  . Diaphragmatic hernia without mention  of obstruction or gangrene 2011  . Diverticulosis of colon (without mention of hemorrhage) 2011  . Lumbago 2011  . Dizziness and giddiness 2011  . Other malaise and fatigue 2011  . Abnormality of gait 2011  . Senile dementia, uncomplicated 1610  . Hypertension    Past Surgical History  Procedure Laterality Date  . Tonsillectomy  1923  . Cataract extraction w/ intraocular lens  implant, bilateral  1985  . Retinal detachment surgery    . Esophageal dilation  2011  . Breast cyst aspiration     Social History:   reports that she has never smoked. She has  never used smokeless tobacco. She reports that she does not drink alcohol or use illicit drugs.  No family history on file.  Medications: Patient's Medications  New Prescriptions   No medications on file  Previous Medications   ACETAMINOPHEN (TYLENOL) 325 MG TABLET    Take 650 mg by mouth every 6 (six) hours as needed for mild pain or moderate pain.   CARBOXYMETHYLCELLUL-GLYCERIN (OPTIVE) 0.5-0.9 % SOLN    Apply 1 drop to eye 4 (four) times daily. Instill 1 drop in both eyes four times daily. *Wait 3-5 minutes between eye drops.*   CETIRIZINE (ZYRTEC) 5 MG TABLET    Take 5 mg by mouth daily as needed (itching).   LEVOTHYROXINE (SYNTHROID, LEVOTHROID) 25 MCG TABLET    Take 25 mcg by mouth daily before breakfast. *Check pulse weekly on Saturday.*   LOSARTAN (COZAAR) 100 MG TABLET    Take 100 mg by mouth daily.   METOPROLOL (LOPRESSOR) 50 MG TABLET    Take 50 mg by mouth 2 (two) times daily. For blood pressure   OMEPRAZOLE (PRILOSEC) 20 MG CAPSULE    Take 20 mg by mouth daily. *Do Not Crush* Take on a empty stomach.   SACCHAROMYCES BOULARDII (FLORASTOR) 250 MG CAPSULE    Take 250 mg by mouth 2 (two) times daily. 14 day therapy patient to complete course on 01/03/2015   SENNOSIDES-DOCUSATE SODIUM (SENOKOT-S) 8.6-50 MG TABLET    Take 1 tablet by mouth at bedtime.   TRIAMTERENE-HYDROCHLOROTHIAZIDE (MAXZIDE-25) 37.5-25 MG PER TABLET    Take 1 tablet by mouth daily.   Modified Medications   No medications on file  Discontinued Medications   No medications on file     Physical Exam: Physical Exam  Constitutional: She is oriented to person, place, and time. She appears well-developed and well-nourished. No distress.  HENT:  Head: Normocephalic and atraumatic.  Right Ear: External ear normal.  Left Ear: External ear normal.  Nose: Nose normal.  Mouth/Throat: Oropharynx is clear and moist. No oropharyngeal exudate.  Eyes: Conjunctivae and EOM are normal. Pupils are equal, round, and reactive to  light. Right eye exhibits no discharge. Left eye exhibits no discharge. No scleral icterus.  Neck: Normal range of motion. Neck supple. No JVD present. No tracheal deviation present. No thyromegaly present.  Cardiovascular: Normal rate, regular rhythm and normal heart sounds.  Exam reveals no friction rub.   No murmur heard. Pulmonary/Chest: Effort normal. No stridor. No respiratory distress. She has no wheezes. She has rales. She exhibits no tenderness.  Bibasilar moist rales.  Abdominal: Soft. Bowel sounds are normal. She exhibits no distension. There is no tenderness. There is no rebound and no guarding.  Musculoskeletal: Normal range of motion. She exhibits no edema or tenderness.  Trace edema BLE on and off   Lymphadenopathy:    She has no cervical adenopathy.  Neurological: She  is alert and oriented to person, place, and time. She has normal reflexes. No cranial nerve deficit. She exhibits normal muscle tone. Coordination normal.  Skin: Skin is warm and dry. No rash noted. She is not diaphoretic. No erythema. No pallor.  Venous dermatitis appearance BLE    Psychiatric: She has a normal mood and affect. Her behavior is normal. Thought content normal. Cognition and memory are impaired. She expresses impulsivity and inappropriate judgment. She exhibits abnormal recent memory.    Filed Vitals:   04/18/15 1810  BP: 110/70  Pulse: 76  Temp: 98.9 F (37.2 C)  TempSrc: Tympanic  Resp: 18      Labs reviewed: Basic Metabolic Panel:  Recent Labs  11/01/14  01/16/15 02/01/15 04/17/15  NA 134*  < > 137 142 136*  K 4.1  < > 4.2 3.7 4.0  BUN 24*  < > 33* 43* 54*  CREATININE 1.1  < > 1.5* 2.0* 1.8*  TSH 3.02  --   --   --   --   < > = values in this interval not displayed. Liver Function Tests:  Recent Labs  01/11/15 01/16/15 04/17/15  AST 15 15 8*  ALT 11 10 8   ALKPHOS 127* 130* 145*   No results for input(s): LIPASE, AMYLASE in the last 8760 hours. No results for input(s):  AMMONIA in the last 8760 hours. CBC:  Recent Labs  12/27/14 04/17/15  WBC 12.5 20.8  HGB 11.6* 10.1*  HCT 34* 31*  PLT 387  --    Lipid Panel: No results for input(s): CHOL, HDL, LDLCALC, TRIG, CHOLHDL, LDLDIRECT in the last 8760 hours.  Past Procedures:  12/27/14 X-ray R+L shoulders, L forearm and hands: no evidence of acute fractures or dislocation 12/27/14 CT head, cervical spine, and maxillofacial bones.   IMPRESSION: 1. No evidence of traumatic intracranial injury or fracture. 2. No evidence of fracture or dislocation with regard to the maxillofacial structures. 3. No evidence of acute fracture or subluxation along the cervical spine. 4. Soft tissue swelling overlying the left frontoparietal calvarium. 5. Moderately severe cortical volume loss and scattered small vessel ischemic microangiopathy. 6. Mild degenerative change noted along the cervical spine. 7. Scattered calcification about the left mandibular condylar head, concerning for chronic left temporomandibular joint disease. 8. 1.6 cm hypodensity at the left thyroid lobe. Consider further evaluation with thyroid ultrasound. If patient is clinically hyperthyroid, consider nuclear medicine thyroid uptake and scan. 9. Dense calcification at the carotid bifurcations bilaterally. Carotid ultrasound would be helpful for further evaluation, when and as deemed clinically appropriate.   Assessment/Plan Edema Not apparent-hold Maxzide in setting of diarrhea.    Diarrhea X 2 days, afebrile, but wbc 20.8. Hold Maxzide and Senokot for now, check C-diff Toxin A/B x3. Prn Imodium for now. Observe the patient.    Leukocytosis 04/17/15 wbc 20.8, afebrile, diarrhea x 2 days-C-diff pending, observe    Chronic kidney disease 04/17/15 Bun/creat 54/1.77--hold Maxzide for now   Hypothyroidism Takes Synthroid 55mcg daily. TSH 3.025 11/01/14   Hypertension Low measurement noted today. Hold Maxzide may help. Continue Losartan  and Metoprolol.    Depression off Mirtazapine 09/21/14 11/27/14 reportedly up and down and crying x1 night a few days ago.  01/08/15-not sleep at night-resume Mirtazapine 7.5mg  nightly.  -stabilized.    Alzheimer's disease Baseline mentation now and no change since off  Namenda and Aricept 06/13/13.  MMSE 11/30 03/21/13. Observe the patient. Etiology of her confusion.      Constipation Diarrhea x 2  days-hold Senokot.    GERD (gastroesophageal reflux disease) Stable, takes Omeprazole 20mg  daily.       Family/ Staff Communication: observe the patient  Goals of Care: SNF  Labs/tests ordered: C-diff A/B x3

## 2015-04-18 NOTE — Assessment & Plan Note (Signed)
Diarrhea x 2 days-hold Senokot.

## 2015-04-18 NOTE — Assessment & Plan Note (Signed)
04/17/15 wbc 20.8, afebrile, diarrhea x 2 days-C-diff pending, observe

## 2015-04-18 NOTE — Assessment & Plan Note (Signed)
Not apparent-hold Maxzide in setting of diarrhea.

## 2015-04-18 NOTE — Assessment & Plan Note (Signed)
X 2 days, afebrile, but wbc 20.8. Hold Maxzide and Senokot for now, check C-diff Toxin A/B x3. Prn Imodium for now. Observe the patient.

## 2015-04-18 NOTE — Assessment & Plan Note (Signed)
Baseline mentation now and no change since off  Namenda and Aricept 06/13/13.  MMSE 11/30 03/21/13. Observe the patient. Etiology of her confusion.

## 2015-04-18 NOTE — Assessment & Plan Note (Signed)
04/17/15 Bun/creat 54/1.77--hold Maxzide for now

## 2015-04-18 NOTE — Assessment & Plan Note (Signed)
Stable, takes Omeprazole 20mg daily.  

## 2015-04-23 ENCOUNTER — Encounter: Payer: Self-pay | Admitting: Nurse Practitioner

## 2015-04-23 ENCOUNTER — Non-Acute Institutional Stay (SKILLED_NURSING_FACILITY): Payer: Medicare Other | Admitting: Nurse Practitioner

## 2015-04-23 DIAGNOSIS — K219 Gastro-esophageal reflux disease without esophagitis: Secondary | ICD-10-CM | POA: Diagnosis not present

## 2015-04-23 DIAGNOSIS — E039 Hypothyroidism, unspecified: Secondary | ICD-10-CM | POA: Diagnosis not present

## 2015-04-23 DIAGNOSIS — I10 Essential (primary) hypertension: Secondary | ICD-10-CM

## 2015-04-23 DIAGNOSIS — F329 Major depressive disorder, single episode, unspecified: Secondary | ICD-10-CM | POA: Diagnosis not present

## 2015-04-23 DIAGNOSIS — G4700 Insomnia, unspecified: Secondary | ICD-10-CM | POA: Diagnosis not present

## 2015-04-23 DIAGNOSIS — R609 Edema, unspecified: Secondary | ICD-10-CM

## 2015-04-23 DIAGNOSIS — K5900 Constipation, unspecified: Secondary | ICD-10-CM | POA: Diagnosis not present

## 2015-04-23 DIAGNOSIS — F32A Depression, unspecified: Secondary | ICD-10-CM

## 2015-04-23 DIAGNOSIS — G309 Alzheimer's disease, unspecified: Secondary | ICD-10-CM | POA: Diagnosis not present

## 2015-04-23 DIAGNOSIS — N183 Chronic kidney disease, stage 3 unspecified: Secondary | ICD-10-CM

## 2015-04-23 DIAGNOSIS — R197 Diarrhea, unspecified: Secondary | ICD-10-CM

## 2015-04-23 DIAGNOSIS — D72829 Elevated white blood cell count, unspecified: Secondary | ICD-10-CM | POA: Diagnosis not present

## 2015-04-23 DIAGNOSIS — M546 Pain in thoracic spine: Secondary | ICD-10-CM | POA: Diagnosis not present

## 2015-04-23 DIAGNOSIS — F0280 Dementia in other diseases classified elsewhere without behavioral disturbance: Secondary | ICD-10-CM

## 2015-04-23 NOTE — Assessment & Plan Note (Signed)
Multiple sites: OA in nature, continue Ibuprofen 200mg  bid.

## 2015-04-23 NOTE — Assessment & Plan Note (Signed)
No apparent edema BLE-dc Maxzide

## 2015-04-23 NOTE — Assessment & Plan Note (Signed)
Frequent loose stools, denied abd pain, nausea, or vomiting. Her orally intake remains the same as prior will update CBC, BMP, TSH. C-diff Toxin A/B negative x3. Dc Senokot S and Maxzide.

## 2015-04-23 NOTE — Assessment & Plan Note (Signed)
Continue Losartan and Metoprolol. Off Maxzide

## 2015-04-23 NOTE — Assessment & Plan Note (Signed)
Persisted frequent loose stools, denied nausea, vomiting, or abd discomfort, she is afebrile, but wbc 20.8. Dc Maxzide and Senokot for now, negative C-diff Toxin A/B x3. Prn Imodium, adding Florastor bid for now. Observe the patient.

## 2015-04-23 NOTE — Assessment & Plan Note (Signed)
off Mirtazapine 09/21/14 11/27/14 reportedly up and down and crying x1 night a few days ago.  01/08/15-not sleep at night-resume Mirtazapine 7.5mg  nightly.  -stabilized.

## 2015-04-23 NOTE — Assessment & Plan Note (Signed)
Stable, takes Omeprazole 20mg daily.  

## 2015-04-23 NOTE — Assessment & Plan Note (Signed)
04/17/15 wbc 20.8, afebrile, diarrhea x 2 days-C-diff negative x3 04/23/15 update CBC. Non toxic appearance.

## 2015-04-23 NOTE — Progress Notes (Signed)
Patient ID: [name], female   DOB: 01-20-1913, 81 y.o.   MRN: [id number]   Code Status: DNR  No Known Allergies  Chief Complaint  Patient presents with  . Medical Management of Chronic Issues  . Acute Visit    frequent loose stools.     HPI: Patient is a 81 y.o. female seen in the SNF at [facility] today for evaluation of frequent loose stools and chronic medical conditions.  Problem List Items Addressed This Visit    Insomnia (Chronic)    Not sleep at night since off Mirtazapine 7.5mg  nightly, continue to observe.  Better since Mirtazapine resumed.          Edema (Chronic)    No apparent edema BLE-dc Maxzide      Hypertension    Continue Losartan and Metoprolol. Off Maxzide       Depression    off Mirtazapine 09/21/14 11/27/14 reportedly up and down and crying x1 night a few days ago.  01/08/15-not sleep at night-resume Mirtazapine 7.5mg  nightly.  -stabilized.       Alzheimer's disease    Baseline mentation now and no change since off  Namenda and Aricept 06/13/13.  MMSE 11/30 03/21/13. Observe the patient. Etiology of her confusion.          Back pain, thoracic    Multiple sites: OA in nature, continue Ibuprofen 200mg  bid.        Hypothyroidism    Takes Synthroid 3mcg daily. TSH 3.025 11/01/14. Update TSH       Constipation    Frequent loose stools, denied abd pain, nausea, or vomiting. Her orally intake remains the same as prior will update CBC, BMP, TSH. C-diff Toxin A/B negative x3. Dc Senokot S and Maxzide.       GERD (gastroesophageal reflux disease)    Stable, takes Omeprazole 20mg  daily.         Chronic kidney disease    04/17/15 Bun/creat 54/1.77--hold Maxzide for now 04/23/15 dc Maxzide, update BMP       Diarrhea    Persisted frequent loose stools, denied nausea, vomiting, or abd discomfort, she is afebrile, but wbc 20.8. Dc Maxzide and Senokot for now, negative C-diff Toxin A/B x3. Prn Imodium, adding Florastor bid for now.  Observe the patient.        Leukocytosis - Primary    04/17/15 wbc 20.8, afebrile, diarrhea x 2 days-C-diff negative x3 04/23/15 update CBC. Non toxic appearance.           Review of Systems:  Review of Systems  Constitutional: Negative for fever, chills and diaphoresis.  HENT: Positive for hearing loss. Negative for congestion, ear discharge, ear pain, nosebleeds, sore throat and tinnitus.   Eyes: Negative for photophobia, pain and redness.  Respiratory: Negative for cough, shortness of breath, wheezing and stridor.   Cardiovascular: Negative for chest pain, palpitations and leg swelling.       Trace on and off  Gastrointestinal: Positive for diarrhea. Negative for nausea, vomiting, abdominal pain, constipation and blood in stool.  Endocrine: Negative for polydipsia.  Genitourinary: Positive for frequency. Negative for dysuria, urgency, hematuria and flank pain.  Musculoskeletal: Positive for back pain. Negative for myalgias and neck pain.  Skin: Negative for rash.  Allergic/Immunologic: Negative for environmental allergies.  Neurological: Negative for dizziness, tremors, seizures, weakness and headaches.  Hematological: Does not bruise/bleed easily.  Psychiatric/Behavioral: Negative for suicidal ideas and hallucinations. The patient is nervous/anxious.      Past Medical History  Diagnosis Date  . Spasm of muscle 11/2011  . Thyroid disease   . Anemia of other chronic disease 2012  . Anxiety   . Peripheral vascular disease, unspecified 2011  . Reflux esophagitis 2011  . Vomiting alone 2011  . Benign neoplasm of colon 2011  . Type II or unspecified type diabetes mellitus without mention of complication, not stated as uncontrolled 2011  . Unspecified vitamin D deficiency 2011  . Hyperlipidemia   . Depression   . Alzheimer's disease 2011  . Unspecified hearing loss 2011  . Stricture and stenosis of esophagus 2011  . Diaphragmatic hernia without mention of obstruction or  gangrene 2011  . Diverticulosis of colon (without mention of hemorrhage) 2011  . Lumbago 2011  . Dizziness and giddiness 2011  . Other malaise and fatigue 2011  . Abnormality of gait 2011  . Senile dementia, uncomplicated 8832  . Hypertension    Past Surgical History  Procedure Laterality Date  . Tonsillectomy  1923  . Cataract extraction w/ intraocular lens  implant, bilateral  1985  . Retinal detachment surgery    . Esophageal dilation  2011  . Breast cyst aspiration     Social History:   reports that she has never smoked. She has never used smokeless tobacco. She reports that she does not drink alcohol or use illicit drugs.  No family history on file.  Medications: Patient's Medications  New Prescriptions   No medications on file  Previous Medications   ACETAMINOPHEN (TYLENOL) 325 MG TABLET    Take 650 mg by mouth every 6 (six) hours as needed for mild pain or moderate pain.   CARBOXYMETHYLCELLUL-GLYCERIN (OPTIVE) 0.5-0.9 % SOLN    Apply 1 drop to eye 4 (four) times daily. Instill 1 drop in both eyes four times daily. *Wait 3-5 minutes between eye drops.*   CETIRIZINE (ZYRTEC) 5 MG TABLET    Take 5 mg by mouth daily as needed (itching).   LEVOTHYROXINE (SYNTHROID, LEVOTHROID) 25 MCG TABLET    Take 25 mcg by mouth daily before breakfast. *Check pulse weekly on Saturday.*   LOSARTAN (COZAAR) 100 MG TABLET    Take 100 mg by mouth daily.   METOPROLOL (LOPRESSOR) 50 MG TABLET    Take 50 mg by mouth 2 (two) times daily. For blood pressure   OMEPRAZOLE (PRILOSEC) 20 MG CAPSULE    Take 20 mg by mouth daily. *Do Not Crush* Take on a empty stomach.   SACCHAROMYCES BOULARDII (FLORASTOR) 250 MG CAPSULE    Take 250 mg by mouth 2 (two) times daily. 14 day therapy patient to complete course on 01/03/2015  Modified Medications   No medications on file  Discontinued Medications   SENNOSIDES-DOCUSATE SODIUM (SENOKOT-S) 8.6-50 MG TABLET    Take 1 tablet by mouth at bedtime.    TRIAMTERENE-HYDROCHLOROTHIAZIDE (MAXZIDE-25) 37.5-25 MG PER TABLET    Take 1 tablet by mouth daily.      Physical Exam: Physical Exam  Constitutional: She is oriented to person, place, and time. She appears well-developed and well-nourished. No distress.  HENT:  Head: Normocephalic and atraumatic.  Right Ear: External ear normal.  Left Ear: External ear normal.  Nose: Nose normal.  Mouth/Throat: Oropharynx is clear and moist. No oropharyngeal exudate.  Eyes: Conjunctivae and EOM are normal. Pupils are equal, round, and reactive to light. Right eye exhibits no discharge. Left eye exhibits no discharge. No scleral icterus.  Neck: Normal range of motion. Neck supple. No JVD present. No tracheal deviation present. No  thyromegaly present.  Cardiovascular: Normal rate, regular rhythm and normal heart sounds.  Exam reveals no friction rub.   No murmur heard. Pulmonary/Chest: Effort normal. No stridor. No respiratory distress. She has no wheezes. She has rales. She exhibits no tenderness.  Bibasilar moist rales.  Abdominal: Soft. Bowel sounds are normal. She exhibits no distension. There is no tenderness. There is no rebound and no guarding.  Musculoskeletal: Normal range of motion. She exhibits no edema or tenderness.  Trace edema BLE on and off   Lymphadenopathy:    She has no cervical adenopathy.  Neurological: She is alert and oriented to person, place, and time. She has normal reflexes. No cranial nerve deficit. She exhibits normal muscle tone. Coordination normal.  Skin: Skin is warm and dry. No rash noted. She is not diaphoretic. No erythema. No pallor.  Venous dermatitis appearance BLE    Psychiatric: She has a normal mood and affect. Her behavior is normal. Thought content normal. Cognition and memory are impaired. She expresses impulsivity and inappropriate judgment. She exhibits abnormal recent memory.    Filed Vitals:   04/23/15 1240  BP: 118/68  Pulse: 68  Temp: 97.9 F  (36.6 C)  TempSrc: Tympanic  Resp: 16      Labs reviewed: Basic Metabolic Panel:  Recent Labs  11/01/14  01/16/15 02/01/15 04/17/15  NA 134*  < > 137 142 136*  K 4.1  < > 4.2 3.7 4.0  BUN 24*  < > 33* 43* 54*  CREATININE 1.1  < > 1.5* 2.0* 1.8*  TSH 3.02  --   --   --   --   < > = values in this interval not displayed. Liver Function Tests:  Recent Labs  01/11/15 01/16/15 04/17/15  AST 15 15 8*  ALT 11 10 8   ALKPHOS 127* 130* 145*   No results for input(s): LIPASE, AMYLASE in the last 8760 hours. No results for input(s): AMMONIA in the last 8760 hours. CBC:  Recent Labs  12/27/14 04/17/15  WBC 12.5 20.8  HGB 11.6* 10.1*  HCT 34* 31*  PLT 387  --    Lipid Panel: No results for input(s): CHOL, HDL, LDLCALC, TRIG, CHOLHDL, LDLDIRECT in the last 8760 hours.  Past Procedures:  12/27/14 X-ray R+L shoulders, L forearm and hands: no evidence of acute fractures or dislocation 12/27/14 CT head, cervical spine, and maxillofacial bones.   IMPRESSION: 1. No evidence of traumatic intracranial injury or fracture. 2. No evidence of fracture or dislocation with regard to the maxillofacial structures. 3. No evidence of acute fracture or subluxation along the cervical spine. 4. Soft tissue swelling overlying the left frontoparietal calvarium. 5. Moderately severe cortical volume loss and scattered small vessel ischemic microangiopathy. 6. Mild degenerative change noted along the cervical spine. 7. Scattered calcification about the left mandibular condylar head, concerning for chronic left temporomandibular joint disease. 8. 1.6 cm hypodensity at the left thyroid lobe. Consider further evaluation with thyroid ultrasound. If patient is clinically hyperthyroid, consider nuclear medicine thyroid uptake and scan. 9. Dense calcification at the carotid bifurcations bilaterally. Carotid ultrasound would be helpful for further evaluation, when and as deemed clinically  appropriate.   Assessment/Plan Constipation Frequent loose stools, denied abd pain, nausea, or vomiting. Her orally intake remains the same as prior will update CBC, BMP, TSH. C-diff Toxin A/B negative x3. Dc Senokot S and Maxzide.    Leukocytosis 04/17/15 wbc 20.8, afebrile, diarrhea x 2 days-C-diff negative x3 04/23/15 update CBC. Non toxic appearance.  Diarrhea Persisted frequent loose stools, denied nausea, vomiting, or abd discomfort, she is afebrile, but wbc 20.8. Dc Maxzide and Senokot for now, negative C-diff Toxin A/B x3. Prn Imodium, adding Florastor bid for now. Observe the patient.     Hypothyroidism Takes Synthroid 82mcg daily. TSH 3.025 11/01/14. Update TSH    Chronic kidney disease 04/17/15 Bun/creat 54/1.77--hold Maxzide for now 04/23/15 dc Maxzide, update BMP    GERD (gastroesophageal reflux disease) Stable, takes Omeprazole 20mg  daily.      Back pain, thoracic Multiple sites: OA in nature, continue Ibuprofen 200mg  bid.     Alzheimer's disease Baseline mentation now and no change since off  Namenda and Aricept 06/13/13.  MMSE 11/30 03/21/13. Observe the patient. Etiology of her confusion.       Depression off Mirtazapine 09/21/14 11/27/14 reportedly up and down and crying x1 night a few days ago.  01/08/15-not sleep at night-resume Mirtazapine 7.5mg  nightly.  -stabilized.    Hypertension Continue Losartan and Metoprolol. Off Maxzide    Edema No apparent edema BLE-dc Maxzide   Insomnia Not sleep at night since off Mirtazapine 7.5mg  nightly, continue to observe.  Better since Mirtazapine resumed.         Family/ Staff Communication: observe the patient  Goals of Care: SNF Hospice service.   Labs/tests ordered: CBC, BMP, TSH

## 2015-04-23 NOTE — Assessment & Plan Note (Signed)
04/17/15 Bun/creat 54/1.77--hold Maxzide for now 04/23/15 dc Maxzide, update BMP

## 2015-04-23 NOTE — Assessment & Plan Note (Signed)
Not sleep at night since off Mirtazapine 7.5mg  nightly, continue to observe.  Better since Mirtazapine resumed.

## 2015-04-23 NOTE — Assessment & Plan Note (Signed)
Baseline mentation now and no change since off  Namenda and Aricept 06/13/13.  MMSE 11/30 03/21/13. Observe the patient. Etiology of her confusion.

## 2015-04-23 NOTE — Assessment & Plan Note (Signed)
Takes Synthroid 59mcg daily. TSH 3.025 11/01/14. Update TSH

## 2015-04-24 LAB — CBC AND DIFFERENTIAL
HCT: 33 % — AB (ref 36–46)
Hemoglobin: 10.8 g/dL — AB (ref 12.0–16.0)
PLATELETS: 373 10*3/uL (ref 150–399)
WBC: 35 10^3/mL

## 2015-04-24 LAB — TSH: TSH: 1.97 u[IU]/mL (ref 0.41–5.90)

## 2015-04-24 LAB — BASIC METABOLIC PANEL
BUN: 62 mg/dL — AB (ref 4–21)
Creatinine: 2 mg/dL — AB (ref 0.5–1.1)
Glucose: 52 mg/dL
Potassium: 4 mmol/L (ref 3.4–5.3)
Sodium: 138 mmol/L (ref 137–147)

## 2015-04-25 ENCOUNTER — Non-Acute Institutional Stay (SKILLED_NURSING_FACILITY): Payer: Medicare Other | Admitting: Nurse Practitioner

## 2015-04-25 ENCOUNTER — Other Ambulatory Visit: Payer: Self-pay | Admitting: Nurse Practitioner

## 2015-04-25 ENCOUNTER — Encounter: Payer: Self-pay | Admitting: Nurse Practitioner

## 2015-04-25 DIAGNOSIS — F329 Major depressive disorder, single episode, unspecified: Secondary | ICD-10-CM

## 2015-04-25 DIAGNOSIS — G4700 Insomnia, unspecified: Secondary | ICD-10-CM | POA: Diagnosis not present

## 2015-04-25 DIAGNOSIS — G309 Alzheimer's disease, unspecified: Secondary | ICD-10-CM

## 2015-04-25 DIAGNOSIS — I10 Essential (primary) hypertension: Secondary | ICD-10-CM

## 2015-04-25 DIAGNOSIS — F32A Depression, unspecified: Secondary | ICD-10-CM

## 2015-04-25 DIAGNOSIS — F0280 Dementia in other diseases classified elsewhere without behavioral disturbance: Secondary | ICD-10-CM

## 2015-04-25 DIAGNOSIS — R197 Diarrhea, unspecified: Secondary | ICD-10-CM | POA: Diagnosis not present

## 2015-04-25 DIAGNOSIS — K219 Gastro-esophageal reflux disease without esophagitis: Secondary | ICD-10-CM

## 2015-04-25 DIAGNOSIS — K5901 Slow transit constipation: Secondary | ICD-10-CM | POA: Diagnosis not present

## 2015-04-25 DIAGNOSIS — N183 Chronic kidney disease, stage 3 unspecified: Secondary | ICD-10-CM

## 2015-04-25 DIAGNOSIS — D72829 Elevated white blood cell count, unspecified: Secondary | ICD-10-CM

## 2015-04-25 DIAGNOSIS — R609 Edema, unspecified: Secondary | ICD-10-CM

## 2015-04-25 DIAGNOSIS — E871 Hypo-osmolality and hyponatremia: Secondary | ICD-10-CM

## 2015-04-25 DIAGNOSIS — E039 Hypothyroidism, unspecified: Secondary | ICD-10-CM

## 2015-04-25 DIAGNOSIS — M546 Pain in thoracic spine: Secondary | ICD-10-CM

## 2015-04-25 NOTE — Assessment & Plan Note (Signed)
Baseline mentation now and no change since off  Namenda and Aricept 06/13/13.  MMSE 11/30 03/21/13. Observe the patient. Etiology of her confusion.

## 2015-04-25 NOTE — Assessment & Plan Note (Signed)
04/24/15 Na 138

## 2015-04-25 NOTE — Assessment & Plan Note (Signed)
Stable now. 

## 2015-04-25 NOTE — Assessment & Plan Note (Signed)
Creatinine is trending up to 2.05 04/24/15-frequent loose stools contributory.

## 2015-04-25 NOTE — Assessment & Plan Note (Signed)
Subsided.

## 2015-04-25 NOTE — Progress Notes (Signed)
Patient ID: [name], female   DOB: 1913/07/28, 81 y.o.   MRN: [id number]   Code Status: DNR  No Known Allergies  Chief Complaint  Patient presents with  . Medical Management of Chronic Issues  . Acute Visit    elevated white count, groin area rash.     HPI: Patient is a 81 y.o. female seen in the SNF at [facility] today for evaluation of elevated white count, groin rash, and chronic medical conditions.  Problem List Items Addressed This Visit    Insomnia (Chronic)    Not sleep at night since off Mirtazapine 7.5mg  nightly, continue to observe.  Better since Mirtazapine resumed.       Edema - Primary (Chronic)    No apparent edema BLE-off Maxzide       Hypertension    Continue Losartan and Metoprolol. Off Maxzide       Depression    off Mirtazapine 09/21/14 11/27/14 reportedly up and down and crying x1 night a few days ago.  01/08/15-not sleep at night-resume Mirtazapine 7.5mg  nightly.  -stabilized.        Alzheimer's disease    Baseline mentation now and no change since off  Namenda and Aricept 06/13/13.  MMSE 11/30 03/21/13. Observe the patient. Etiology of her confusion.         Back pain, thoracic    Multiple sites: OA in nature, continue Ibuprofen 200mg  bid.         Hypothyroidism    04/24/15 TSH 1.974. Takes Synthroid 4mcg daily.        Constipation    Stable now      GERD (gastroesophageal reflux disease)    Stable, takes Omeprazole 20mg  daily.        Hyponatremia    04/24/15 Na 138      Chronic kidney disease    Creatinine is trending up to 2.05 04/24/15-frequent loose stools contributory.       Diarrhea    Subsided.       Leukocytosis    04/25/15 wbc 35.0-will repeat CBC in one week. Diflucan x 3 days to treat groin candidiasis.           Review of Systems:  Review of Systems  Constitutional: Negative for fever, chills and diaphoresis.  HENT: Positive for hearing loss. Negative for congestion, ear discharge, ear  pain, nosebleeds, sore throat and tinnitus.   Eyes: Negative for photophobia, pain and redness.  Respiratory: Negative for cough, shortness of breath, wheezing and stridor.   Cardiovascular: Negative for chest pain, palpitations and leg swelling.       Trace on and off  Gastrointestinal: Positive for diarrhea. Negative for nausea, vomiting, abdominal pain, constipation and blood in stool.       X1 today  Endocrine: Negative for polydipsia.  Genitourinary: Positive for frequency. Negative for dysuria, urgency, hematuria and flank pain.  Musculoskeletal: Positive for back pain. Negative for myalgias and neck pain.  Skin: Positive for rash.       Perineal and groin area.  Small pressure injury area coccyx  Allergic/Immunologic: Negative for environmental allergies.  Neurological: Negative for dizziness, tremors, seizures, weakness and headaches.  Hematological: Does not bruise/bleed easily.  Psychiatric/Behavioral: Negative for suicidal ideas and hallucinations. The patient is nervous/anxious.      Past Medical History  Diagnosis Date  . Spasm of muscle 11/2011  . Thyroid disease   . Anemia of other chronic disease 2012  . Anxiety   . Peripheral vascular disease, unspecified  2011  . Reflux esophagitis 2011  . Vomiting alone 2011  . Benign neoplasm of colon 2011  . Type II or unspecified type diabetes mellitus without mention of complication, not stated as uncontrolled 2011  . Unspecified vitamin D deficiency 2011  . Hyperlipidemia   . Depression   . Alzheimer's disease 2011  . Unspecified hearing loss 2011  . Stricture and stenosis of esophagus 2011  . Diaphragmatic hernia without mention of obstruction or gangrene 2011  . Diverticulosis of colon (without mention of hemorrhage) 2011  . Lumbago 2011  . Dizziness and giddiness 2011  . Other malaise and fatigue 2011  . Abnormality of gait 2011  . Senile dementia, uncomplicated 1497  . Hypertension    Past Surgical History    Procedure Laterality Date  . Tonsillectomy  1923  . Cataract extraction w/ intraocular lens  implant, bilateral  1985  . Retinal detachment surgery    . Esophageal dilation  2011  . Breast cyst aspiration     Social History:   reports that she has never smoked. She has never used smokeless tobacco. She reports that she does not drink alcohol or use illicit drugs.  No family history on file.  Medications: Patient's Medications  New Prescriptions   No medications on file  Previous Medications   ACETAMINOPHEN (TYLENOL) 325 MG TABLET    Take 650 mg by mouth every 6 (six) hours as needed for mild pain or moderate pain.   CARBOXYMETHYLCELLUL-GLYCERIN (OPTIVE) 0.5-0.9 % SOLN    Apply 1 drop to eye 4 (four) times daily. Instill 1 drop in both eyes four times daily. *Wait 3-5 minutes between eye drops.*   CETIRIZINE (ZYRTEC) 5 MG TABLET    Take 5 mg by mouth daily as needed (itching).   LEVOTHYROXINE (SYNTHROID, LEVOTHROID) 25 MCG TABLET    Take 25 mcg by mouth daily before breakfast. *Check pulse weekly on Saturday.*   LOSARTAN (COZAAR) 100 MG TABLET    Take 100 mg by mouth daily.   METOPROLOL (LOPRESSOR) 50 MG TABLET    Take 50 mg by mouth 2 (two) times daily. For blood pressure   OMEPRAZOLE (PRILOSEC) 20 MG CAPSULE    Take 20 mg by mouth daily. *Do Not Crush* Take on a empty stomach.   SACCHAROMYCES BOULARDII (FLORASTOR) 250 MG CAPSULE    Take 250 mg by mouth 2 (two) times daily. 14 day therapy patient to complete course on 01/03/2015  Modified Medications   No medications on file  Discontinued Medications   No medications on file     Physical Exam: Physical Exam  Constitutional: She is oriented to person, place, and time. She appears well-developed and well-nourished. No distress.  HENT:  Head: Normocephalic and atraumatic.  Right Ear: External ear normal.  Left Ear: External ear normal.  Nose: Nose normal.  Mouth/Throat: Oropharynx is clear and moist. No oropharyngeal exudate.   Eyes: Conjunctivae and EOM are normal. Pupils are equal, round, and reactive to light. Right eye exhibits no discharge. Left eye exhibits no discharge. No scleral icterus.  Neck: Normal range of motion. Neck supple. No JVD present. No tracheal deviation present. No thyromegaly present.  Cardiovascular: Normal rate, regular rhythm and normal heart sounds.  Exam reveals no friction rub.   No murmur heard. Pulmonary/Chest: Effort normal. No stridor. No respiratory distress. She has no wheezes. She has rales. She exhibits no tenderness.  Bibasilar moist rales.  Abdominal: Soft. Bowel sounds are normal. She exhibits no distension. There is no  tenderness. There is no rebound and no guarding.  Musculoskeletal: Normal range of motion. She exhibits no edema or tenderness.  Trace edema BLE on and off   Lymphadenopathy:    She has no cervical adenopathy.  Neurological: She is alert and oriented to person, place, and time. She has normal reflexes. No cranial nerve deficit. She exhibits normal muscle tone. Coordination normal.  Skin: Skin is warm and dry. Rash noted. She is not diaphoretic. No erythema. No pallor.  Venous dermatitis appearance BLE Groin and perineal area rash.  Small pressure injury area at coccyx.     Psychiatric: She has a normal mood and affect. Her behavior is normal. Thought content normal. Cognition and memory are impaired. She expresses impulsivity and inappropriate judgment. She exhibits abnormal recent memory.    Filed Vitals:   04/25/15 1407  BP: 120/70  Pulse: 86  Temp: 98.2 F (36.8 C)  TempSrc: Tympanic  Resp: 16      Labs reviewed: Basic Metabolic Panel:  Recent Labs  11/01/14  02/01/15 04/17/15 04/24/15  NA 134*  < > 142 136* 138  K 4.1  < > 3.7 4.0 4.0  BUN 24*  < > 43* 54* 62*  CREATININE 1.1  < > 2.0* 1.8* 2.0*  TSH 3.02  --   --   --  1.97  < > = values in this interval not displayed. Liver Function Tests:  Recent Labs  01/11/15 01/16/15  04/17/15  AST 15 15 8*  ALT 11 10 8   ALKPHOS 127* 130* 145*   No results for input(s): LIPASE, AMYLASE in the last 8760 hours. No results for input(s): AMMONIA in the last 8760 hours. CBC:  Recent Labs  12/27/14 04/17/15 04/24/15  WBC 12.5 20.8 35.0  HGB 11.6* 10.1* 10.8*  HCT 34* 31* 33*  PLT 387  --  373   Lipid Panel: No results for input(s): CHOL, HDL, LDLCALC, TRIG, CHOLHDL, LDLDIRECT in the last 8760 hours.  Past Procedures:  12/27/14 X-ray R+L shoulders, L forearm and hands: no evidence of acute fractures or dislocation 12/27/14 CT head, cervical spine, and maxillofacial bones.   IMPRESSION: 1. No evidence of traumatic intracranial injury or fracture. 2. No evidence of fracture or dislocation with regard to the maxillofacial structures. 3. No evidence of acute fracture or subluxation along the cervical spine. 4. Soft tissue swelling overlying the left frontoparietal calvarium. 5. Moderately severe cortical volume loss and scattered small vessel ischemic microangiopathy. 6. Mild degenerative change noted along the cervical spine. 7. Scattered calcification about the left mandibular condylar head, concerning for chronic left temporomandibular joint disease. 8. 1.6 cm hypodensity at the left thyroid lobe. Consider further evaluation with thyroid ultrasound. If patient is clinically hyperthyroid, consider nuclear medicine thyroid uptake and scan. 9. Dense calcification at the carotid bifurcations bilaterally. Carotid ultrasound would be helpful for further evaluation, when and as deemed clinically appropriate.   Assessment/Plan Edema No apparent edema BLE-off Maxzide    Leukocytosis 04/25/15 wbc 35.0-will repeat CBC in one week. Diflucan x 3 days to treat groin candidiasis.     Diarrhea Subsided.    Chronic kidney disease Creatinine is trending up to 2.05 04/24/15-frequent loose stools contributory.    Hyponatremia 04/24/15 Na 138   GERD  (gastroesophageal reflux disease) Stable, takes Omeprazole 20mg  daily.     Constipation Stable now   Hypothyroidism 04/24/15 TSH 1.974. Takes Synthroid 62mcg daily.     Insomnia Not sleep at night since off Mirtazapine 7.5mg  nightly, continue to observe.  Better since Mirtazapine resumed.    Hypertension Continue Losartan and Metoprolol. Off Maxzide    Depression off Mirtazapine 09/21/14 11/27/14 reportedly up and down and crying x1 night a few days ago.  01/08/15-not sleep at night-resume Mirtazapine 7.5mg  nightly.  -stabilized.     Alzheimer's disease Baseline mentation now and no change since off  Namenda and Aricept 06/13/13.  MMSE 11/30 03/21/13. Observe the patient. Etiology of her confusion.      Back pain, thoracic Multiple sites: OA in nature, continue Ibuprofen 200mg  bid.        Family/ Staff Communication: observe the patient  Goals of Care: SNF Hospice service.   Labs/tests ordered: CBC one week.

## 2015-04-25 NOTE — Assessment & Plan Note (Signed)
off Mirtazapine 09/21/14 11/27/14 reportedly up and down and crying x1 night a few days ago.  01/08/15-not sleep at night-resume Mirtazapine 7.5mg  nightly.  -stabilized.

## 2015-04-25 NOTE — Assessment & Plan Note (Signed)
Continue Losartan and Metoprolol. Off Maxzide

## 2015-04-25 NOTE — Assessment & Plan Note (Signed)
04/24/15 TSH 1.974. Takes Synthroid 76mcg daily.

## 2015-04-25 NOTE — Assessment & Plan Note (Signed)
Multiple sites: OA in nature, continue Ibuprofen 200mg  bid.

## 2015-04-25 NOTE — Assessment & Plan Note (Signed)
Stable, takes Omeprazole 20mg daily.  

## 2015-04-25 NOTE — Assessment & Plan Note (Signed)
Not sleep at night since off Mirtazapine 7.5mg  nightly, continue to observe.  Better since Mirtazapine resumed.

## 2015-04-25 NOTE — Assessment & Plan Note (Signed)
04/25/15 wbc 35.0-will repeat CBC in one week. Diflucan x 3 days to treat groin candidiasis.

## 2015-04-25 NOTE — Assessment & Plan Note (Addendum)
No apparent edema BLE-off Maxzide

## 2015-05-04 LAB — CBC AND DIFFERENTIAL
HCT: 31 % — AB (ref 36–46)
HEMOGLOBIN: 10.3 g/dL — AB (ref 12.0–16.0)
Platelets: 348 10*3/uL (ref 150–399)
WBC: 11.2 10^3/mL

## 2015-05-09 ENCOUNTER — Other Ambulatory Visit: Payer: Self-pay | Admitting: Nurse Practitioner

## 2015-05-09 DIAGNOSIS — D72829 Elevated white blood cell count, unspecified: Secondary | ICD-10-CM

## 2015-05-30 ENCOUNTER — Non-Acute Institutional Stay (SKILLED_NURSING_FACILITY): Payer: Medicare Other | Admitting: Nurse Practitioner

## 2015-05-30 ENCOUNTER — Encounter: Payer: Self-pay | Admitting: Nurse Practitioner

## 2015-05-30 DIAGNOSIS — G309 Alzheimer's disease, unspecified: Secondary | ICD-10-CM | POA: Diagnosis not present

## 2015-05-30 DIAGNOSIS — I10 Essential (primary) hypertension: Secondary | ICD-10-CM | POA: Diagnosis not present

## 2015-05-30 DIAGNOSIS — F329 Major depressive disorder, single episode, unspecified: Secondary | ICD-10-CM | POA: Diagnosis not present

## 2015-05-30 DIAGNOSIS — R609 Edema, unspecified: Secondary | ICD-10-CM | POA: Diagnosis not present

## 2015-05-30 DIAGNOSIS — N183 Chronic kidney disease, stage 3 unspecified: Secondary | ICD-10-CM

## 2015-05-30 DIAGNOSIS — F0280 Dementia in other diseases classified elsewhere without behavioral disturbance: Secondary | ICD-10-CM

## 2015-05-30 DIAGNOSIS — E871 Hypo-osmolality and hyponatremia: Secondary | ICD-10-CM | POA: Diagnosis not present

## 2015-05-30 DIAGNOSIS — G4700 Insomnia, unspecified: Secondary | ICD-10-CM

## 2015-05-30 DIAGNOSIS — K219 Gastro-esophageal reflux disease without esophagitis: Secondary | ICD-10-CM | POA: Diagnosis not present

## 2015-05-30 DIAGNOSIS — E039 Hypothyroidism, unspecified: Secondary | ICD-10-CM

## 2015-05-30 DIAGNOSIS — F32A Depression, unspecified: Secondary | ICD-10-CM

## 2015-05-30 DIAGNOSIS — M546 Pain in thoracic spine: Secondary | ICD-10-CM

## 2015-05-30 DIAGNOSIS — K5900 Constipation, unspecified: Secondary | ICD-10-CM | POA: Diagnosis not present

## 2015-05-30 DIAGNOSIS — D72829 Elevated white blood cell count, unspecified: Secondary | ICD-10-CM

## 2015-05-30 NOTE — Progress Notes (Signed)
Patient ID: [name], female   DOB: 09-13-1913, 81 y.o.   MRN: [id number]   Code Status: DNR  No Known Allergies  Chief Complaint  Patient presents with  . Medical Management of Chronic Issues    HPI: Patient is a 81 y.o. female seen in the SNF at [facility] today for evaluation of chronic medical conditions.  Problem List Items Addressed This Visit    Insomnia - Primary (Chronic)    Not sleep at night since off Mirtazapine 7.5mg  nightly, continue to observe.  Better since Mirtazapine resumed.       Edema (Chronic)    No apparent edema BLE-off Maxzide        Hypertension    Continue Losartan and Metoprolol. Off Maxzide       Depression    off Mirtazapine 09/21/14 11/27/14 reportedly up and down and crying x1 night a few days ago.  01/08/15-not sleep at night-resume Mirtazapine 7.5mg  nightly.  -stabilized.         Alzheimer's disease    Baseline mentation now and no change since off  Namenda and Aricept 06/13/13.  MMSE 11/30 03/21/13. Observe the patient. Etiology of her confusion.         Back pain, thoracic    Multiple sites: OA in nature, continue Ibuprofen 200mg  bid.        Hypothyroidism    04/24/15 TSH 1.974. Takes Synthroid 30mcg daily.         Constipation    Stable now       GERD (gastroesophageal reflux disease)    Stable, takes Omeprazole 20mg  daily.        Hyponatremia    Resolved. Na 138 04/24/15      Chronic kidney disease    04/24/15 Bun/creat 62/2.05      Leukocytosis    Resolved. Wbc 11.2 05/04/15 from 35.0 04/25/15         Review of Systems:  Review of Systems  Constitutional: Negative for fever, chills and diaphoresis.  HENT: Positive for hearing loss. Negative for congestion, ear discharge, ear pain, nosebleeds, sore throat and tinnitus.   Eyes: Negative for photophobia, pain and redness.  Respiratory: Negative for cough, shortness of breath, wheezing and stridor.   Cardiovascular: Negative for chest pain,  palpitations and leg swelling.       Trace on and off  Gastrointestinal: Negative for nausea, vomiting, abdominal pain, diarrhea, constipation and blood in stool.       Occasionally nausea, vomiting, or diarrhea-mostly related to her ingested food  Endocrine: Negative for polydipsia.  Genitourinary: Positive for frequency. Negative for dysuria, urgency, hematuria and flank pain.  Musculoskeletal: Positive for back pain. Negative for myalgias and neck pain.  Skin: Positive for rash.       Perineal and groin area.  Small pressure injury area coccyx  Allergic/Immunologic: Negative for environmental allergies.  Neurological: Negative for dizziness, tremors, seizures, weakness and headaches.  Hematological: Does not bruise/bleed easily.  Psychiatric/Behavioral: Negative for suicidal ideas and hallucinations. The patient is nervous/anxious.      Past Medical History  Diagnosis Date  . Spasm of muscle 11/2011  . Thyroid disease   . Anemia of other chronic disease 2012  . Anxiety   . Peripheral vascular disease, unspecified 2011  . Reflux esophagitis 2011  . Vomiting alone 2011  . Benign neoplasm of colon 2011  . Type II or unspecified type diabetes mellitus without mention of complication, not stated as uncontrolled 2011  . Unspecified vitamin  D deficiency 2011  . Hyperlipidemia   . Depression   . Alzheimer's disease 2011  . Unspecified hearing loss 2011  . Stricture and stenosis of esophagus 2011  . Diaphragmatic hernia without mention of obstruction or gangrene 2011  . Diverticulosis of colon (without mention of hemorrhage) 2011  . Lumbago 2011  . Dizziness and giddiness 2011  . Other malaise and fatigue 2011  . Abnormality of gait 2011  . Senile dementia, uncomplicated 8563  . Hypertension    Past Surgical History  Procedure Laterality Date  . Tonsillectomy  1923  . Cataract extraction w/ intraocular lens  implant, bilateral  1985  . Retinal detachment surgery    .  Esophageal dilation  2011  . Breast cyst aspiration     Social History:   reports that she has never smoked. She has never used smokeless tobacco. She reports that she does not drink alcohol or use illicit drugs.  No family history on file.  Medications: Patient's Medications  New Prescriptions   No medications on file  Previous Medications   ACETAMINOPHEN (TYLENOL) 325 MG TABLET    Take 650 mg by mouth every 6 (six) hours as needed for mild pain or moderate pain.   CARBOXYMETHYLCELLUL-GLYCERIN (OPTIVE) 0.5-0.9 % SOLN    Apply 1 drop to eye 4 (four) times daily. Instill 1 drop in both eyes four times daily. *Wait 3-5 minutes between eye drops.*   CETIRIZINE (ZYRTEC) 5 MG TABLET    Take 5 mg by mouth daily as needed (itching).   LEVOTHYROXINE (SYNTHROID, LEVOTHROID) 25 MCG TABLET    Take 25 mcg by mouth daily before breakfast. *Check pulse weekly on Saturday.*   LOSARTAN (COZAAR) 100 MG TABLET    Take 100 mg by mouth daily.   METOPROLOL (LOPRESSOR) 50 MG TABLET    Take 50 mg by mouth 2 (two) times daily. For blood pressure   OMEPRAZOLE (PRILOSEC) 20 MG CAPSULE    Take 20 mg by mouth daily. *Do Not Crush* Take on a empty stomach.   SACCHAROMYCES BOULARDII (FLORASTOR) 250 MG CAPSULE    Take 250 mg by mouth 2 (two) times daily. 14 day therapy patient to complete course on 01/03/2015  Modified Medications   No medications on file  Discontinued Medications   No medications on file     Physical Exam: Physical Exam  Constitutional: She is oriented to person, place, and time. She appears well-developed and well-nourished. No distress.  HENT:  Head: Normocephalic and atraumatic.  Right Ear: External ear normal.  Left Ear: External ear normal.  Nose: Nose normal.  Mouth/Throat: Oropharynx is clear and moist. No oropharyngeal exudate.  Eyes: Conjunctivae and EOM are normal. Pupils are equal, round, and reactive to light. Right eye exhibits no discharge. Left eye exhibits no discharge. No  scleral icterus.  Neck: Normal range of motion. Neck supple. No JVD present. No tracheal deviation present. No thyromegaly present.  Cardiovascular: Normal rate, regular rhythm and normal heart sounds.  Exam reveals no friction rub.   No murmur heard. Pulmonary/Chest: Effort normal. No stridor. No respiratory distress. She has no wheezes. She has rales. She exhibits no tenderness.  Bibasilar moist rales.  Abdominal: Soft. Bowel sounds are normal. She exhibits no distension. There is no tenderness. There is no rebound and no guarding.  Musculoskeletal: Normal range of motion. She exhibits no edema or tenderness.  Trace edema BLE on and off   Lymphadenopathy:    She has no cervical adenopathy.  Neurological: She  is alert and oriented to person, place, and time. She has normal reflexes. No cranial nerve deficit. She exhibits normal muscle tone. Coordination normal.  Skin: Skin is warm and dry. Rash noted. She is not diaphoretic. No erythema. No pallor.  Venous dermatitis appearance BLE Groin and perineal area rash.  Small pressure injury area at coccyx.     Psychiatric: She has a normal mood and affect. Her behavior is normal. Thought content normal. Cognition and memory are impaired. She expresses impulsivity and inappropriate judgment. She exhibits abnormal recent memory.    Filed Vitals:   05/30/15 1614  BP: 128/65  Pulse: 60  Temp: 97.5 F (36.4 C)  TempSrc: Tympanic  Resp: 18      Labs reviewed: Basic Metabolic Panel:  Recent Labs  11/01/14  02/01/15 04/17/15 04/24/15  NA 134*  < > 142 136* 138  K 4.1  < > 3.7 4.0 4.0  BUN 24*  < > 43* 54* 62*  CREATININE 1.1  < > 2.0* 1.8* 2.0*  TSH 3.02  --   --   --  1.97  < > = values in this interval not displayed. Liver Function Tests:  Recent Labs  01/11/15 01/16/15 04/17/15  AST 15 15 8*  ALT 11 10 8   ALKPHOS 127* 130* 145*   No results for input(s): LIPASE, AMYLASE in the last 8760 hours. No results for input(s):  AMMONIA in the last 8760 hours. CBC:  Recent Labs  12/27/14 04/17/15 04/24/15 05/04/15  WBC 12.5 20.8 35.0 11.2  HGB 11.6* 10.1* 10.8* 10.3*  HCT 34* 31* 33* 31*  PLT 387  --  373 348   Lipid Panel: No results for input(s): CHOL, HDL, LDLCALC, TRIG, CHOLHDL, LDLDIRECT in the last 8760 hours.  Past Procedures:  12/27/14 X-ray R+L shoulders, L forearm and hands: no evidence of acute fractures or dislocation 12/27/14 CT head, cervical spine, and maxillofacial bones.   IMPRESSION: 1. No evidence of traumatic intracranial injury or fracture. 2. No evidence of fracture or dislocation with regard to the maxillofacial structures. 3. No evidence of acute fracture or subluxation along the cervical spine. 4. Soft tissue swelling overlying the left frontoparietal calvarium. 5. Moderately severe cortical volume loss and scattered small vessel ischemic microangiopathy. 6. Mild degenerative change noted along the cervical spine. 7. Scattered calcification about the left mandibular condylar head, concerning for chronic left temporomandibular joint disease. 8. 1.6 cm hypodensity at the left thyroid lobe. Consider further evaluation with thyroid ultrasound. If patient is clinically hyperthyroid, consider nuclear medicine thyroid uptake and scan. 9. Dense calcification at the carotid bifurcations bilaterally. Carotid ultrasound would be helpful for further evaluation, when and as deemed clinically appropriate.   Assessment/Plan Insomnia Not sleep at night since off Mirtazapine 7.5mg  nightly, continue to observe.  Better since Mirtazapine resumed.   Edema No apparent edema BLE-off Maxzide    Hypertension Continue Losartan and Metoprolol. Off Maxzide   Depression off Mirtazapine 09/21/14 11/27/14 reportedly up and down and crying x1 night a few days ago.  01/08/15-not sleep at night-resume Mirtazapine 7.5mg  nightly.  -stabilized.     Alzheimer's disease Baseline mentation now and  no change since off  Namenda and Aricept 06/13/13.  MMSE 11/30 03/21/13. Observe the patient. Etiology of her confusion.     Back pain, thoracic Multiple sites: OA in nature, continue Ibuprofen 200mg  bid.    Hypothyroidism 04/24/15 TSH 1.974. Takes Synthroid 3mcg daily.     Constipation Stable now   GERD (gastroesophageal reflux disease) Stable,  takes Omeprazole 20mg  daily.    Hyponatremia Resolved. Na 138 04/24/15  Chronic kidney disease 04/24/15 Bun/creat 62/2.05  Leukocytosis Resolved. Wbc 11.2 05/04/15 from 35.0 04/25/15    Family/ Staff Communication: observe the patient  Goals of Care: SNF Hospice service.   Labs/tests ordered: none

## 2015-05-30 NOTE — Assessment & Plan Note (Signed)
Continue Losartan and Metoprolol. Off Maxzide

## 2015-05-30 NOTE — Assessment & Plan Note (Signed)
Baseline mentation now and no change since off  Namenda and Aricept 06/13/13.  MMSE 11/30 03/21/13. Observe the patient. Etiology of her confusion.

## 2015-05-30 NOTE — Assessment & Plan Note (Signed)
off Mirtazapine 09/21/14 11/27/14 reportedly up and down and crying x1 night a few days ago.  01/08/15-not sleep at night-resume Mirtazapine 7.5mg  nightly.  -stabilized.

## 2015-05-30 NOTE — Assessment & Plan Note (Signed)
Resolved. Na 138 04/24/15

## 2015-05-30 NOTE — Assessment & Plan Note (Signed)
04/24/15 Bun/creat 62/2.05

## 2015-05-30 NOTE — Assessment & Plan Note (Signed)
Stable now. 

## 2015-05-30 NOTE — Assessment & Plan Note (Signed)
Stable, takes Omeprazole 20mg daily.  

## 2015-05-30 NOTE — Assessment & Plan Note (Signed)
Not sleep at night since off Mirtazapine 7.5mg  nightly, continue to observe.  Better since Mirtazapine resumed.

## 2015-05-30 NOTE — Assessment & Plan Note (Signed)
Multiple sites: OA in nature, continue Ibuprofen 200mg  bid.

## 2015-05-30 NOTE — Assessment & Plan Note (Signed)
04/24/15 TSH 1.974. Takes Synthroid 75mcg daily.

## 2015-05-30 NOTE — Assessment & Plan Note (Signed)
Resolved. Wbc 11.2 05/04/15 from 35.0 04/25/15

## 2015-05-30 NOTE — Assessment & Plan Note (Signed)
No apparent edema BLE-off Maxzide

## 2015-08-06 ENCOUNTER — Non-Acute Institutional Stay (SKILLED_NURSING_FACILITY): Payer: Medicare Other | Admitting: Nurse Practitioner

## 2015-08-06 ENCOUNTER — Encounter: Payer: Self-pay | Admitting: Nurse Practitioner

## 2015-08-06 DIAGNOSIS — E039 Hypothyroidism, unspecified: Secondary | ICD-10-CM

## 2015-08-06 DIAGNOSIS — M546 Pain in thoracic spine: Secondary | ICD-10-CM | POA: Diagnosis not present

## 2015-08-06 DIAGNOSIS — R609 Edema, unspecified: Secondary | ICD-10-CM | POA: Diagnosis not present

## 2015-08-06 DIAGNOSIS — K5900 Constipation, unspecified: Secondary | ICD-10-CM

## 2015-08-06 DIAGNOSIS — G4700 Insomnia, unspecified: Secondary | ICD-10-CM | POA: Diagnosis not present

## 2015-08-06 DIAGNOSIS — M6289 Other specified disorders of muscle: Secondary | ICD-10-CM

## 2015-08-06 DIAGNOSIS — G309 Alzheimer's disease, unspecified: Secondary | ICD-10-CM | POA: Diagnosis not present

## 2015-08-06 DIAGNOSIS — R531 Weakness: Secondary | ICD-10-CM | POA: Insufficient documentation

## 2015-08-06 DIAGNOSIS — K219 Gastro-esophageal reflux disease without esophagitis: Secondary | ICD-10-CM | POA: Diagnosis not present

## 2015-08-06 DIAGNOSIS — I10 Essential (primary) hypertension: Secondary | ICD-10-CM

## 2015-08-06 DIAGNOSIS — F32A Depression, unspecified: Secondary | ICD-10-CM

## 2015-08-06 DIAGNOSIS — F329 Major depressive disorder, single episode, unspecified: Secondary | ICD-10-CM | POA: Diagnosis not present

## 2015-08-06 DIAGNOSIS — F0280 Dementia in other diseases classified elsewhere without behavioral disturbance: Secondary | ICD-10-CM

## 2015-08-06 NOTE — Assessment & Plan Note (Signed)
Sleeps well, continue Mirtazapine.

## 2015-08-06 NOTE — Assessment & Plan Note (Signed)
Presumed clinical etiology: CVA, LUE muscle strength 3/5, LLE 2/5. Will provide comfort measures, mechanical lift for transfer.

## 2015-08-06 NOTE — Assessment & Plan Note (Signed)
Stable, takes Omeprazole 20mg daily.  

## 2015-08-06 NOTE — Assessment & Plan Note (Signed)
Stable, continue Mirtazapine, hx of failed dc Mirtazapine.

## 2015-08-06 NOTE — Progress Notes (Signed)
Patient ID: [name], female   DOB: 1913-01-29, 81 y.o.   MRN: [id number]  Location:  SNF [facility] Provider:  Marlana Latus NP  Code Status:  DNR Goals of care: Advanced Directive information    Chief Complaint  Patient presents with  . Medical Management of Chronic Issues  . Acute Visit    left sided weakness, left shoulder decreased ROM with pain     HPI: Patient is a 81 y.o. female seen in the SNF at [facility] today for evaluation of  Left sided weakness x2 days, no facial weakness or slurred speech, chronic left shoulder pain with ROM as usual, has been using Voltaren cream. Hospice service presently, the patient appears comfortable except ROM of the left shoulder. Hx GERD, asymptomatic presetnly, insomnia/depression is well managed with Mirtazapine 7.5mg , off Maxizide and no apparent dependent edema noted, blood pressure is at the high end of norma, allow permissive control given advanced age of 26, no constipation, continue gradual cognitive decline that the patient only fellows a simple directions. Left shoulder pain and back pain with ROM has been managed with NSAIDs orally and topically. Taking Levothyroxine and last TSH wnl may this year.  Review of Systems:  Review of Systems  Constitutional: Negative for fever, chills and diaphoresis.  HENT: Positive for hearing loss. Negative for congestion, ear discharge, ear pain, nosebleeds, sore throat and tinnitus.   Eyes: Negative for photophobia, pain and redness.  Respiratory: Negative for cough, shortness of breath, wheezing and stridor.   Cardiovascular: Negative for chest pain, palpitations and leg swelling.       Trace edema in ankles on and off  Gastrointestinal: Negative for nausea, vomiting, abdominal pain, diarrhea, constipation and blood in stool.  Genitourinary: Positive for frequency. Negative for dysuria, urgency, hematuria and flank pain.  Musculoskeletal: Positive for back pain. Negative for myalgias and neck  pain.       Left shoulder pain with ROM  Skin: Negative for rash.  Neurological: Positive for focal weakness. Negative for dizziness, tremors, seizures, weakness and headaches.       New onset left sided weakness  Endo/Heme/Allergies: Negative for environmental allergies and polydipsia. Does not bruise/bleed easily.  Psychiatric/Behavioral: Negative for suicidal ideas and hallucinations. The patient is nervous/anxious.     Past Medical History  Diagnosis Date  . Spasm of muscle 11/2011  . Thyroid disease   . Anemia of other chronic disease 2012  . Anxiety   . Peripheral vascular disease, unspecified 2011  . Reflux esophagitis 2011  . Vomiting alone 2011  . Benign neoplasm of colon 2011  . Type II or unspecified type diabetes mellitus without mention of complication, not stated as uncontrolled 2011  . Unspecified vitamin D deficiency 2011  . Hyperlipidemia   . Depression   . Alzheimer's disease 2011  . Unspecified hearing loss 2011  . Stricture and stenosis of esophagus 2011  . Diaphragmatic hernia without mention of obstruction or gangrene 2011  . Diverticulosis of colon (without mention of hemorrhage) 2011  . Lumbago 2011  . Dizziness and giddiness 2011  . Other malaise and fatigue 2011  . Abnormality of gait 2011  . Senile dementia, uncomplicated 5916  . Hypertension     Patient Active Problem List   Diagnosis Date Noted  . Acute left-sided weakness 08/06/2015  . Diarrhea 04/18/2015  . Leukocytosis 04/18/2015  . Acute bronchitis 01/29/2015  . Venous stasis dermatitis of both lower extremities 01/08/2015  . Chronic kidney disease 01/08/2015  .  Facial abrasion 12/27/2014  . Contusion of left ring finger 12/27/2014  . Contusion of left foot 11/27/2014  . Hyponatremia 10/30/2014  . Urinary tract infection, site not specified 06/07/2014  . GERD (gastroesophageal reflux disease) 01/04/2014  . Constipation 08/03/2013  . Fall 05/04/2013  . Edema 04/20/2013  . Insomnia  04/14/2013  . Back pain, thoracic 04/07/2013  . Hypothyroidism 04/07/2013  . Abnormality of gait   . Hypertension   . Depression   . Alzheimer's disease     No Known Allergies  Medications: Patient's Medications  New Prescriptions   No medications on file  Previous Medications   ACETAMINOPHEN (TYLENOL) 325 MG TABLET    Take 650 mg by mouth every 6 (six) hours as needed for mild pain or moderate pain.   CARBOXYMETHYLCELLUL-GLYCERIN (OPTIVE) 0.5-0.9 % SOLN    Apply 1 drop to eye 4 (four) times daily. Instill 1 drop in both eyes four times daily. *Wait 3-5 minutes between eye drops.*   CETIRIZINE (ZYRTEC) 5 MG TABLET    Take 5 mg by mouth daily as needed (itching).   LEVOTHYROXINE (SYNTHROID, LEVOTHROID) 25 MCG TABLET    Take 25 mcg by mouth daily before breakfast. *Check pulse weekly on Saturday.*   LOSARTAN (COZAAR) 100 MG TABLET    Take 100 mg by mouth daily.   METOPROLOL (LOPRESSOR) 50 MG TABLET    Take 50 mg by mouth 2 (two) times daily. For blood pressure   OMEPRAZOLE (PRILOSEC) 20 MG CAPSULE    Take 20 mg by mouth daily. *Do Not Crush* Take on a empty stomach.   SACCHAROMYCES BOULARDII (FLORASTOR) 250 MG CAPSULE    Take 250 mg by mouth 2 (two) times daily. 14 day therapy patient to complete course on 01/03/2015  Modified Medications   No medications on file  Discontinued Medications   No medications on file    Physical Exam: Filed Vitals:   08/06/15 1601  BP: 150/68  Pulse: 60  Temp: 98.2 F (36.8 C)  TempSrc: Tympanic  Resp: 18   There is no height or weight on file to calculate BMI.  Physical Exam  Constitutional: She is oriented to person, place, and time. She appears well-developed and well-nourished. No distress.  HENT:  Head: Normocephalic and atraumatic.  Right Ear: External ear normal.  Left Ear: External ear normal.  Nose: Nose normal.  Mouth/Throat: Oropharynx is clear and moist. No oropharyngeal exudate.  Eyes: Conjunctivae and EOM are normal. Pupils  are equal, round, and reactive to light. Right eye exhibits no discharge. Left eye exhibits no discharge. No scleral icterus.  Neck: Normal range of motion. Neck supple. No JVD present. No tracheal deviation present. No thyromegaly present.  Cardiovascular: Normal rate, regular rhythm and normal heart sounds.  Exam reveals no friction rub.   No murmur heard. Pulmonary/Chest: Effort normal. No stridor. No respiratory distress. She has no wheezes. She has rales. She exhibits no tenderness.  Bibasilar moist rales.  Abdominal: Soft. Bowel sounds are normal. She exhibits no distension. There is no tenderness. There is no rebound and no guarding.  Musculoskeletal: Normal range of motion. She exhibits no edema or tenderness.  Trace edema BLE on and off. Left shoulder and mid back pain with ROM, chronic, comfort when rested in her recliner.    Lymphadenopathy:    She has no cervical adenopathy.  Neurological: She is alert and oriented to person, place, and time. She has normal reflexes. No cranial nerve deficit. She exhibits abnormal muscle tone. Coordination abnormal.  LUE 3/5, LLE 2/5, new  Skin: Skin is warm and dry. Rash noted. She is not diaphoretic. No erythema. No pallor.  Venous dermatitis appearance BLE Groin and perineal area rash.  Small pressure injury area at coccyx.     Psychiatric: She has a normal mood and affect. Her behavior is normal. Thought content normal. Cognition and memory are impaired. She expresses impulsivity and inappropriate judgment. She exhibits abnormal recent memory.    Labs reviewed: Basic Metabolic Panel:  Recent Labs  02/01/15 04/17/15 04/24/15  NA 142 136* 138  K 3.7 4.0 4.0  BUN 43* 54* 62*  CREATININE 2.0* 1.8* 2.0*    Liver Function Tests:  Recent Labs  01/11/15 01/16/15 04/17/15  AST 15 15 8*  ALT 11 10 8   ALKPHOS 127* 130* 145*    CBC:  Recent Labs  12/27/14 04/17/15 04/24/15 05/04/15  WBC 12.5 20.8 35.0 11.2  HGB 11.6* 10.1* 10.8*  10.3*  HCT 34* 31* 33* 31*  PLT 387  --  373 348    Lab Results  Component Value Date   TSH 1.97 04/24/2015   No results found for: HGBA1C No results found for: CHOL, HDL, LDLCALC, LDLDIRECT, TRIG, CHOLHDL  Significant Diagnostic Results since last visit: none  Patient Care Team: Estill Dooms, MD as PCP - General (Internal Medicine)  Assessment/Plan Problem List Items Addressed This Visit    Insomnia - Primary (Chronic)    Sleeps well, continue Mirtazapine.       Edema (Chronic)    No apparent edema BLE, continue to be off Maxzide      Hypertension    Continue Losartan and Metoprolol.        Depression    Stable, continue Mirtazapine, hx of failed dc Mirtazapine.       Alzheimer's disease    off  Namenda and Aricept 06/13/13.  MMSE 11/30 03/21/13. Fellows simple directions only.       Back pain, thoracic    Multiple sites: OA in nature, managing pain.         Hypothyroidism    04/24/15 TSH 1.974. Takes Synthroid 25mcg daily.        Constipation    Stable.       GERD (gastroesophageal reflux disease)    Stable, takes Omeprazole 20mg  daily.         Acute left-sided weakness    Presumed clinical etiology: CVA, LUE muscle strength 3/5, LLE 2/5. Will provide comfort measures, mechanical lift for transfer.           Family/ staff Communication: mechanical lift for transfer, monitor for left sided weakness.   Labs/tests ordered:  none  ManXie Carol Loftin NP [facility] [address], [place] [address] On Call:  [phone] & follow prompts after 5pm & weekends Office Phone:  [phone] Office Fax:  [phone]

## 2015-08-06 NOTE — Assessment & Plan Note (Signed)
Continue Losartan and Metoprolol

## 2015-08-06 NOTE — Assessment & Plan Note (Signed)
No apparent edema BLE, continue to be off Maxzide

## 2015-08-06 NOTE — Assessment & Plan Note (Signed)
04/24/15 TSH 1.974. Takes Synthroid 18mcg daily.

## 2015-08-06 NOTE — Assessment & Plan Note (Signed)
Stable

## 2015-08-06 NOTE — Assessment & Plan Note (Signed)
off  Namenda and Aricept 06/13/13.  MMSE 11/30 03/21/13. Fellows simple directions only.

## 2015-08-06 NOTE — Assessment & Plan Note (Signed)
Multiple sites: OA in nature, managing pain.

## 2015-08-17 ENCOUNTER — Non-Acute Institutional Stay (SKILLED_NURSING_FACILITY): Payer: Medicare Other | Admitting: Internal Medicine

## 2015-08-17 ENCOUNTER — Encounter: Payer: Self-pay | Admitting: Internal Medicine

## 2015-08-17 DIAGNOSIS — E039 Hypothyroidism, unspecified: Secondary | ICD-10-CM | POA: Diagnosis not present

## 2015-08-17 DIAGNOSIS — F329 Major depressive disorder, single episode, unspecified: Secondary | ICD-10-CM

## 2015-08-17 DIAGNOSIS — I10 Essential (primary) hypertension: Secondary | ICD-10-CM

## 2015-08-17 DIAGNOSIS — M25512 Pain in left shoulder: Secondary | ICD-10-CM

## 2015-08-17 DIAGNOSIS — R609 Edema, unspecified: Secondary | ICD-10-CM | POA: Diagnosis not present

## 2015-08-17 DIAGNOSIS — F32A Depression, unspecified: Secondary | ICD-10-CM

## 2015-08-17 DIAGNOSIS — M6289 Other specified disorders of muscle: Secondary | ICD-10-CM

## 2015-08-17 DIAGNOSIS — N183 Chronic kidney disease, stage 3 unspecified: Secondary | ICD-10-CM

## 2015-08-17 DIAGNOSIS — D72829 Elevated white blood cell count, unspecified: Secondary | ICD-10-CM | POA: Diagnosis not present

## 2015-08-17 DIAGNOSIS — G309 Alzheimer's disease, unspecified: Secondary | ICD-10-CM

## 2015-08-17 DIAGNOSIS — F0280 Dementia in other diseases classified elsewhere without behavioral disturbance: Secondary | ICD-10-CM

## 2015-08-17 DIAGNOSIS — R531 Weakness: Secondary | ICD-10-CM

## 2015-08-17 NOTE — Progress Notes (Signed)
Patient ID: [name], female   DOB: [birth year], 81 y.o.   MRN: [id number]     HISTORY AND PHYSICAL  Location:  [facility] Room Number: 108 Place of Service: SNF (31)   Extended Emergency Contact Information Primary Emergency Contact: Rangel,Kim Address: [address]          [place], [place] [address] [place] Phone: [phone] Mobile Phone: [phone] Relation: Other Secondary Emergency Contact: Lohr,Cindy          [place] Phone: [phone] Relation: None  Advanced Directive information Does patient have an advance directive?: Yes, Type of Advance Directive: Living will;Out of facility DNR (pink MOST or yellow form), Pre-existing out of facility DNR order (yellow form or pink MOST form): Yellow form placed in chart (order not valid for inpatient use), Does patient want to make changes to advanced directive?: No - Patient declined  Chief Complaint  Patient presents with  . Annual Exam    HPI:  Patient seen for annual exam. Seems to be doing fairly well and continues to benefit from enhanced activities for the memory impaired. Currently living in the memory unit of [facility].  Left shoulder pain - patient demonstrates significant left shoulder discomfort with manipulation of the shoulder. Minimal movement to abduct or raise the arm creates discomfort. There is no anatomic deformity noted.  Alzheimer's disease - slow progression  Chronic kidney disease, stage 3 (moderate)  - slight rise in creatinine in the last year  Edema - improved. Little evidence of edema in ankles today  Essential hypertension - controlled  Acute left-sided weakness - initially noted in June 2016. Appears to have stabilized and she is moving the left arm and left leg well. Left arm movement is somewhat compromised by left shoulder discomfort.  Depression - stable off antidepressives  Hypothyroidism,  unspecified hypothyroidism type - last TSH normal  Leukocytosis - elevated at the time of her urinary tract infection. Follow-up lab ordered.    Past Medical History  Diagnosis Date  . Spasm of muscle 11/2011  . Thyroid disease   . Anemia of other chronic disease 2012  . Anxiety   . Peripheral vascular disease, unspecified 2011  . Reflux esophagitis 2011  . Vomiting alone 2011  . Benign neoplasm of colon 2011  . Type II or unspecified type diabetes mellitus without mention of complication, not stated as uncontrolled 2011  . Unspecified vitamin D deficiency 2011  . Hyperlipidemia   . Depression   . Alzheimer's disease 2011  . Unspecified hearing loss 2011  . Stricture and stenosis of esophagus 2011  . Diaphragmatic hernia without mention of obstruction or gangrene 2011  . Diverticulosis of colon (without mention of hemorrhage) 2011  . Lumbago 2011  . Dizziness and giddiness 2011  . Other malaise and fatigue 2011  . Abnormality of gait 2011  . Senile dementia, uncomplicated 3244  . Hypertension     Past Surgical History  Procedure Laterality Date  . Tonsillectomy  1923  . Cataract extraction w/ intraocular lens  implant, bilateral  1985  . Retinal detachment surgery    . Esophageal dilation  2011  . Breast cyst aspiration      Patient Care Team: Estill Dooms, MD as PCP - General (Internal Medicine)  Social History   Social History  . Marital Status: Widowed    Spouse Name: N/A  . Number of Children: N/A  . Years of  Education: N/A   Occupational History  . Not on file.   Social History Main Topics  . Smoking status: Never Smoker   . Smokeless tobacco: Never Used  . Alcohol Use: No  . Drug Use: No  . Sexual Activity: No   Other Topics Concern  . Not on file   Social History Narrative    reports that she has never smoked. She has never used smokeless tobacco. She reports that she does not drink alcohol or use illicit drugs.  No family history on  file. No family status information on file.    Immunization History  Administered Date(s) Administered  . Influenza-Unspecified 10/18/2014    No Known Allergies  Medications: Patient's Medications  New Prescriptions   No medications on file  Previous Medications   ACETAMINOPHEN (TYLENOL) 325 MG TABLET    Take 650 mg by mouth every 6 (six) hours as needed for mild pain or moderate pain.   CARBOXYMETHYLCELLUL-GLYCERIN (OPTIVE) 0.5-0.9 % SOLN    Apply 1 drop to eye 4 (four) times daily. Instill 1 drop in both eyes four times daily. *Wait 3-5 minutes between eye drops.*   CETIRIZINE (ZYRTEC) 5 MG TABLET    Take 5 mg by mouth daily as needed (itching).   LEVOTHYROXINE (SYNTHROID, LEVOTHROID) 25 MCG TABLET    Take 25 mcg by mouth daily before breakfast. *Check pulse weekly on Saturday.*   LOSARTAN (COZAAR) 100 MG TABLET    Take 100 mg by mouth daily.   METOPROLOL (LOPRESSOR) 50 MG TABLET    Take 50 mg by mouth 2 (two) times daily. For blood pressure   OMEPRAZOLE (PRILOSEC) 20 MG CAPSULE    Take 20 mg by mouth daily. *Do Not Crush* Take on a empty stomach.   SACCHAROMYCES BOULARDII (FLORASTOR) 250 MG CAPSULE    Take 250 mg by mouth 2 (two) times daily. 14 day therapy patient to complete course on 01/03/2015  Modified Medications   No medications on file  Discontinued Medications   No medications on file    Review of Systems  Constitutional: Negative for fever, chills and diaphoresis.  HENT: Positive for hearing loss. Negative for congestion, ear discharge, ear pain, nosebleeds, sore throat and tinnitus.   Eyes: Negative for photophobia, pain and redness.  Respiratory: Negative for cough, shortness of breath, wheezing and stridor.   Cardiovascular: Negative for chest pain, palpitations and leg swelling.       Trace edema in ankles on and off  Gastrointestinal: Negative for nausea, vomiting, abdominal pain, diarrhea, constipation and blood in stool.  Endocrine: Negative for polydipsia.    Genitourinary: Positive for frequency. Negative for dysuria, urgency, hematuria and flank pain.  Musculoskeletal: Positive for back pain. Negative for myalgias and neck pain.       Left shoulder pain with ROM  Skin: Negative for rash.  Allergic/Immunologic: Negative for environmental allergies.  Neurological: Negative for dizziness, tremors, seizures, weakness and headaches.        left sided weakness seems stable. Patient has chronic dementia that has slowly progressed over the last several years.  Hematological: Does not bruise/bleed easily.  Psychiatric/Behavioral: Positive for confusion. Negative for suicidal ideas and hallucinations. The patient is nervous/anxious.     Filed Vitals:   08/17/15 1557  BP: 110/56  Pulse: 64  Temp: 97.8 F (36.6 C)  Resp: 24  Height: '5\' 2"'  (1.575 m)  Weight: 190 lb 4.8 oz (86.32 kg)   Body mass index is 34.8 kg/(m^2).  Physical Exam  Constitutional:  She appears well-developed and well-nourished. No distress.  HENT:  Head: Normocephalic and atraumatic.  Right Ear: External ear normal.  Left Ear: External ear normal.  Nose: Nose normal.  Mouth/Throat: Oropharynx is clear and moist. No oropharyngeal exudate.  Eyes: Conjunctivae and EOM are normal. Pupils are equal, round, and reactive to light. Right eye exhibits no discharge. Left eye exhibits no discharge. No scleral icterus.  Neck: Normal range of motion. Neck supple. No JVD present. No tracheal deviation present. No thyromegaly present.  Cardiovascular: Normal rate, regular rhythm and normal heart sounds.  Exam reveals no friction rub.   No murmur heard. Pulmonary/Chest: Effort normal. No stridor. No respiratory distress. She has no wheezes. She has no rales. She exhibits no tenderness.  Abdominal: Soft. Bowel sounds are normal. She exhibits no distension. There is no tenderness. There is no rebound and no guarding.  Musculoskeletal: Normal range of motion. She exhibits no edema or  tenderness.  Trace edema BLE on and off. Left shoulder and mid back pain with ROM, chronic  Lymphadenopathy:    She has no cervical adenopathy.  Neurological: She is alert. She has normal reflexes. No cranial nerve deficit. She exhibits abnormal muscle tone. Coordination abnormal.  LUE 4/5, LLE 4/5  Skin: Skin is warm and dry. Rash noted. She is not diaphoretic. No erythema. No pallor.  Venous dermatitis appearance BLE Groin and perineal area rash.  Psychiatric: She has a normal mood and affect. Her behavior is normal. Thought content normal. Cognition and memory are impaired. She expresses impulsivity and inappropriate judgment. She exhibits abnormal recent memory.    Labs reviewed: Lab Summary Latest Ref Rng 05/04/2015 04/24/2015 04/17/2015 02/01/2015 01/16/2015  Hemoglobin 12.0 - 16.0 g/dL 10.3(A) 10.8(A) 10.1(A) (None) (None)  Hematocrit 36 - 46 % 31(A) 33(A) 31(A) (None) (None)  White count - 11.2 35.0 20.8 (None) (None)  Platelet count 150 - 399 K/L 348 373 (None) (None) (None)  Sodium 137 - 147 mmol/L (None) 138 136(A) 142 137  Potassium 3.4 - 5.3 mmol/L (None) 4.0 4.0 3.7 4.2  Calcium - (None) (None) (None) (None) (None)  Phosphorus - (None) (None) (None) (None) (None)  Creatinine 0.5 - 1.1 mg/dL (None) 2.0(A) 1.8(A) 2.0(A) 1.5(A)  AST 13 - 35 U/L (None) (None) 8(A) (None) 15  Alk Phos 25 - 125 U/L (None) (None) 145(A) (None) 130(A)  Bilirubin - (None) (None) (None) (None) (None)  Glucose - (None) 52 79 141 (None)  Cholesterol - (None) (None) (None) (None) (None)  HDL cholesterol - (None) (None) (None) (None) (None)  Triglycerides - (None) (None) (None) (None) (None)  LDL Direct - (None) (None) (None) (None) (None)  LDL Calc - (None) (None) (None) (None) (None)  Total protein - (None) (None) (None) (None) (None)  Albumin - (None) (None) (None) (None) (None)   Lab Results  Component Value Date   BUN 62* 04/24/2015   No results found for: HGBA1C Lab Results  Component Value  Date   TSH 1.97 04/24/2015    Assessment/Plan  1. Left shoulder pain Continue to monitor. X-rays done 113 showed degenerative cortical irregularities of the glenoid was also degenerative changes at the left acromioclavicular joint. There was a suggestion of minimal calcification overlying the expected location of the rotator cuff.  2. Alzheimer's disease Slowly progressive  3. Chronic kidney disease, stage 3 (moderate) -CMP, future  4. Edema Resolved  5. Essential hypertension Controlled  6. Acute left-sided weakness Improved  7. Depression Stable  8. Hypothyroidism, unspecified hypothyroidism type Compensated  9.  Leukocytosis -CBC, future

## 2015-08-21 ENCOUNTER — Other Ambulatory Visit: Payer: Self-pay | Admitting: Nurse Practitioner

## 2015-08-21 DIAGNOSIS — D72829 Elevated white blood cell count, unspecified: Secondary | ICD-10-CM

## 2015-08-21 DIAGNOSIS — N182 Chronic kidney disease, stage 2 (mild): Secondary | ICD-10-CM

## 2015-08-21 LAB — HEPATIC FUNCTION PANEL
ALT: 17 U/L (ref 7–35)
AST: 16 U/L (ref 13–35)
Alkaline Phosphatase: 123 U/L (ref 25–125)
Bilirubin, Total: 0.3 mg/dL

## 2015-08-21 LAB — CBC AND DIFFERENTIAL
HCT: 35 % — AB (ref 36–46)
Hemoglobin: 11.5 g/dL — AB (ref 12.0–16.0)
PLATELETS: 475 10*3/uL — AB (ref 150–399)
WBC: 10 10^3/mL

## 2015-08-21 LAB — BASIC METABOLIC PANEL
BUN: 55 mg/dL — AB (ref 4–21)
CREATININE: 1.4 mg/dL — AB (ref 0.5–1.1)
Glucose: 133 mg/dL
Potassium: 4.7 mmol/L (ref 3.4–5.3)
SODIUM: 136 mmol/L — AB (ref 137–147)

## 2015-08-29 ENCOUNTER — Non-Acute Institutional Stay (SKILLED_NURSING_FACILITY): Payer: Medicare Other | Admitting: Nurse Practitioner

## 2015-08-29 ENCOUNTER — Encounter: Payer: Self-pay | Admitting: Nurse Practitioner

## 2015-08-29 DIAGNOSIS — G4700 Insomnia, unspecified: Secondary | ICD-10-CM

## 2015-08-29 DIAGNOSIS — K219 Gastro-esophageal reflux disease without esophagitis: Secondary | ICD-10-CM | POA: Diagnosis not present

## 2015-08-29 DIAGNOSIS — F0280 Dementia in other diseases classified elsewhere without behavioral disturbance: Secondary | ICD-10-CM

## 2015-08-29 DIAGNOSIS — F329 Major depressive disorder, single episode, unspecified: Secondary | ICD-10-CM

## 2015-08-29 DIAGNOSIS — D72829 Elevated white blood cell count, unspecified: Secondary | ICD-10-CM

## 2015-08-29 DIAGNOSIS — F32A Depression, unspecified: Secondary | ICD-10-CM

## 2015-08-29 DIAGNOSIS — G309 Alzheimer's disease, unspecified: Secondary | ICD-10-CM | POA: Diagnosis not present

## 2015-08-29 DIAGNOSIS — N182 Chronic kidney disease, stage 2 (mild): Secondary | ICD-10-CM

## 2015-08-29 DIAGNOSIS — E039 Hypothyroidism, unspecified: Secondary | ICD-10-CM

## 2015-08-29 DIAGNOSIS — K5900 Constipation, unspecified: Secondary | ICD-10-CM | POA: Diagnosis not present

## 2015-08-29 DIAGNOSIS — M6289 Other specified disorders of muscle: Secondary | ICD-10-CM | POA: Diagnosis not present

## 2015-08-29 DIAGNOSIS — I10 Essential (primary) hypertension: Secondary | ICD-10-CM

## 2015-08-29 DIAGNOSIS — R609 Edema, unspecified: Secondary | ICD-10-CM | POA: Diagnosis not present

## 2015-08-29 DIAGNOSIS — R531 Weakness: Secondary | ICD-10-CM

## 2015-08-29 NOTE — Assessment & Plan Note (Signed)
Stable, creatinine 1.43 last checked 08/21/15

## 2015-08-29 NOTE — Assessment & Plan Note (Signed)
Stable, continue Mirtazapine, hx of failed dc Mirtazapine.  

## 2015-08-29 NOTE — Progress Notes (Signed)
Patient ID: [name], female   DOB: Sep 03, 1913, 81 y.o.   MRN: [id number]  Location:  SNF [facility] Provider:  Marlana Latus NP  Code Status:  DNR Goals of care: Advanced Directive information    Chief Complaint  Patient presents with  . Medical Management of Chronic Issues     HPI: Patient is a 81 y.o. female seen in the SNF at [facility] today for evaluation of chronic medical conditions. Hx of HTN, controlled on Losartan and Metoprolol. She sleeps well, mood is stable while on Mirtazapine. She is total care of her ADLs due to advanced dementia and physical frailty, off memory preserving meds Namenda and Aricept. Last TSH wnl 04/2015 while on Levothyroxine 69mcg for hypothyroidism. No edema since off Maxzide, her renal function has improved creatinine down from 2 04/2015 to 1.43 08/2015. She has BM daily and no s/s of GERD.   Review of Systems:  Review of Systems  Constitutional: Negative for fever and chills.  HENT: Positive for hearing loss. Negative for congestion, ear discharge and ear pain.   Eyes: Negative for pain and redness.  Respiratory: Negative for cough, shortness of breath, wheezing and stridor.   Cardiovascular: Negative for chest pain, palpitations and leg swelling.       Trace edema in ankles on and off  Gastrointestinal: Negative for nausea, vomiting, abdominal pain and constipation.  Genitourinary: Positive for frequency. Negative for dysuria and urgency.  Musculoskeletal: Positive for back pain. Negative for myalgias and neck pain.       Left shoulder pain with ROM  Skin: Negative for rash.  Neurological: Negative for dizziness, tremors, seizures, weakness and headaches.        left sided weakness seems stable. Patient has chronic dementia that has slowly progressed over the last several years.  Psychiatric/Behavioral: Positive for memory loss. Negative for suicidal ideas and hallucinations. The patient is nervous/anxious.     Past Medical History    Diagnosis Date  . Spasm of muscle 11/2011  . Thyroid disease   . Anemia of other chronic disease 2012  . Anxiety   . Peripheral vascular disease, unspecified 2011  . Reflux esophagitis 2011  . Vomiting alone 2011  . Benign neoplasm of colon 2011  . Type II or unspecified type diabetes mellitus without mention of complication, not stated as uncontrolled 2011  . Unspecified vitamin D deficiency 2011  . Hyperlipidemia   . Depression   . Alzheimer's disease 2011  . Unspecified hearing loss 2011  . Stricture and stenosis of esophagus 2011  . Diaphragmatic hernia without mention of obstruction or gangrene 2011  . Diverticulosis of colon (without mention of hemorrhage) 2011  . Lumbago 2011  . Dizziness and giddiness 2011  . Other malaise and fatigue 2011  . Abnormality of gait 2011  . Senile dementia, uncomplicated 6962  . Hypertension     Patient Active Problem List   Diagnosis Date Noted  . Left shoulder pain 08/17/2015  . Acute left-sided weakness 08/06/2015  . Leukocytosis 04/18/2015  . Chronic kidney disease 01/08/2015  . GERD (gastroesophageal reflux disease) 01/04/2014  . Constipation 08/03/2013  . History of fall 05/04/2013  . Edema 04/20/2013  . Insomnia 04/14/2013  . Back pain, thoracic 04/07/2013  . Hypothyroidism 04/07/2013  . Abnormality of gait   . Hypertension   . Depression   . Alzheimer's disease     No Known Allergies  Medications: Patient's Medications  New Prescriptions   No medications on file  Previous  Medications   ACETAMINOPHEN (TYLENOL) 325 MG TABLET    Take 650 mg by mouth every 6 (six) hours as needed for mild pain or moderate pain.   CARBOXYMETHYLCELLUL-GLYCERIN (OPTIVE) 0.5-0.9 % SOLN    Apply 1 drop to eye 4 (four) times daily. Instill 1 drop in both eyes four times daily. *Wait 3-5 minutes between eye drops.*   CETIRIZINE (ZYRTEC) 5 MG TABLET    Take 5 mg by mouth daily as needed (itching).   LEVOTHYROXINE (SYNTHROID, LEVOTHROID) 25 MCG  TABLET    Take 25 mcg by mouth daily before breakfast. *Check pulse weekly on Saturday.*   LOSARTAN (COZAAR) 100 MG TABLET    Take 100 mg by mouth daily.   METOPROLOL (LOPRESSOR) 50 MG TABLET    Take 50 mg by mouth 2 (two) times daily. For blood pressure   OMEPRAZOLE (PRILOSEC) 20 MG CAPSULE    Take 20 mg by mouth daily. *Do Not Crush* Take on a empty stomach.   SACCHAROMYCES BOULARDII (FLORASTOR) 250 MG CAPSULE    Take 250 mg by mouth 2 (two) times daily. 14 day therapy patient to complete course on 01/03/2015  Modified Medications   No medications on file  Discontinued Medications   No medications on file    Physical Exam: Filed Vitals:   08/29/15 1549  BP: 110/56  Pulse: 64  Temp: 97.8 F (36.6 C)  TempSrc: Tympanic  Resp: 20   There is no weight on file to calculate BMI.  Physical Exam  Constitutional: She appears well-developed and well-nourished. No distress.  HENT:  Head: Normocephalic and atraumatic.  Right Ear: External ear normal.  Left Ear: External ear normal.  Nose: Nose normal.  Mouth/Throat: Oropharynx is clear and moist. No oropharyngeal exudate.  Eyes: Conjunctivae and EOM are normal. Pupils are equal, round, and reactive to light. Right eye exhibits no discharge. Left eye exhibits no discharge. No scleral icterus.  Neck: Normal range of motion. Neck supple. No JVD present. No tracheal deviation present. No thyromegaly present.  Cardiovascular: Normal rate, regular rhythm and normal heart sounds.  Exam reveals no friction rub.   No murmur heard. Pulmonary/Chest: Effort normal. No stridor. No respiratory distress. She has no wheezes. She has no rales.  Abdominal: Soft. Bowel sounds are normal. She exhibits no distension. There is no tenderness.  Musculoskeletal: Normal range of motion. She exhibits no edema or tenderness.  Trace edema BLE on and off. Left shoulder and mid back pain with ROM, chronic  Lymphadenopathy:    She has no cervical adenopathy.    Neurological: She is alert. She has normal reflexes. No cranial nerve deficit. She exhibits abnormal muscle tone. Coordination abnormal.  LUE 4/5, LLE 4/5  Skin: Skin is warm and dry. Rash noted. She is not diaphoretic. No erythema. No pallor.  Venous dermatitis appearance BLE   Psychiatric: She has a normal mood and affect. Her behavior is normal. Thought content normal. Cognition and memory are impaired. She expresses impulsivity and inappropriate judgment. She exhibits abnormal recent memory.    Labs reviewed: Basic Metabolic Panel:  Recent Labs  04/17/15 04/24/15 08/21/15  NA 136* 138 136*  K 4.0 4.0 4.7  BUN 54* 62* 55*  CREATININE 1.8* 2.0* 1.4*    Liver Function Tests:  Recent Labs  01/16/15 04/17/15 08/21/15  AST 15 8* 16  ALT 10 8 17   ALKPHOS 130* 145* 123    CBC:  Recent Labs  04/24/15 05/04/15 08/21/15  WBC 35.0 11.2 10.0  HGB 10.8*  10.3* 11.5*  HCT 33* 31* 35*  PLT 373 348 475*    Lab Results  Component Value Date   TSH 1.97 04/24/2015   No results found for: HGBA1C No results found for: CHOL, HDL, LDLCALC, LDLDIRECT, TRIG, CHOLHDL  Significant Diagnostic Results since last visit: none  Patient Care Team: Estill Dooms, MD as PCP - General (Internal Medicine)  Assessment/Plan Problem List Items Addressed This Visit    Hypertension - Primary (Chronic)    Controlled. Continue Losartan and Metoprolol.      Depression (Chronic)    Stable, continue Mirtazapine, hx of failed dc Mirtazapine.       Alzheimer's disease (Chronic)    off  Namenda and Aricept 06/13/13.  MMSE 11/30 03/21/13. Fellows simple directions only. Total care of her ADLs      Hypothyroidism (Chronic)    04/24/15 TSH 1.974. Continue Synthroid 35mcg daily.       Insomnia (Chronic)    Sleeps well, continue Mirtazapine.       Edema (Chronic)    No apparent edema, off Maxzide.       Chronic kidney disease (Chronic)    Stable, creatinine 1.43 last checked 08/21/15       Leukocytosis (Chronic)    Resolved, wbc 10.0 08/21/15      Constipation    No problem.       GERD (gastroesophageal reflux disease)    Stable, no change in plan of tx      Acute left-sided weakness    mild          Family/ staff Communication: continue SNF MCU for care needs  Labs/tests ordered: none  [facility] Mast NP [facility] [address], [place] [address] On Call:  [phone] & follow prompts after 5pm & weekends Office Phone:  [phone] Office Fax:  [phone]

## 2015-08-29 NOTE — Assessment & Plan Note (Signed)
Sleeps well, continue Mirtazapine.  

## 2015-08-29 NOTE — Assessment & Plan Note (Signed)
Resolved, wbc 10.0 08/21/15

## 2015-08-29 NOTE — Assessment & Plan Note (Signed)
Stable, no change in plan of tx

## 2015-08-29 NOTE — Assessment & Plan Note (Signed)
No problem.

## 2015-08-29 NOTE — Assessment & Plan Note (Signed)
off  Namenda and Aricept 06/13/13.  MMSE 11/30 03/21/13. Fellows simple directions only. Total care of her ADLs

## 2015-08-29 NOTE — Assessment & Plan Note (Signed)
No apparent edema, off Maxzide.

## 2015-08-29 NOTE — Assessment & Plan Note (Signed)
04/24/15 TSH 1.974. Continue Synthroid 23mcg daily.

## 2015-08-29 NOTE — Assessment & Plan Note (Signed)
Controlled. Continue Losartan and Metoprolol.

## 2015-08-29 NOTE — Assessment & Plan Note (Signed)
mild

## 2015-09-15 DEATH — deceased

## 2016-04-09 IMAGING — CT CT HEAD W/O CM
4 of 9 series · 15 of 30 positions shown, 16 images · non-contrast
Comparison: CT of the head performed 06/05/2010

CLINICAL DATA: Status post fall, with concern for head, cervical
spine or maxillofacial injury. Initial encounter.

EXAM:
CT HEAD WITHOUT CONTRAST
CT MAXILLOFACIAL WITHOUT CONTRAST
CT CERVICAL SPINE WITHOUT CONTRAST
TECHNIQUE: Multidetector CT imaging of the head, cervical spine, and
maxillofacial structures were performed using the standard protocol
without intravenous contrast. Multiplanar CT image reconstructions
of the cervical spine and maxillofacial structures were also
generated.

[Series 3: facial st · axial · 0.34mm/px · z∈[-200,-110]mm · 4 of 76 slices shown, 5 images]
[im 16/76  brain]
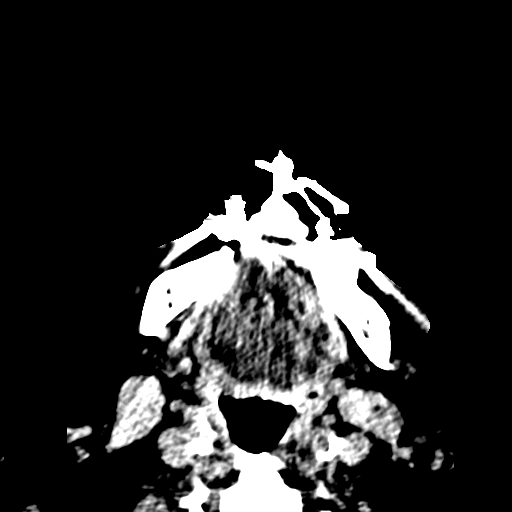
[im 16/76  bone]
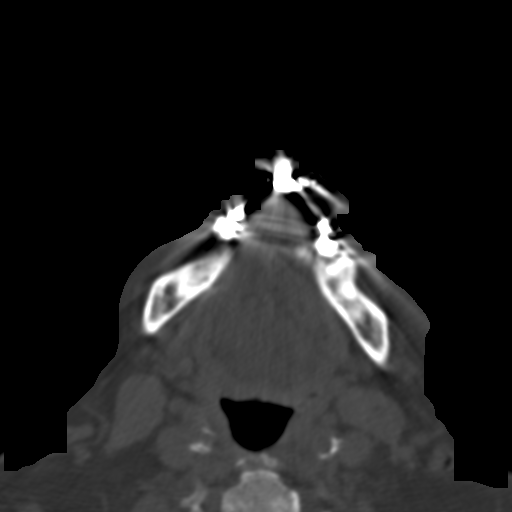
[im 31/76  brain]
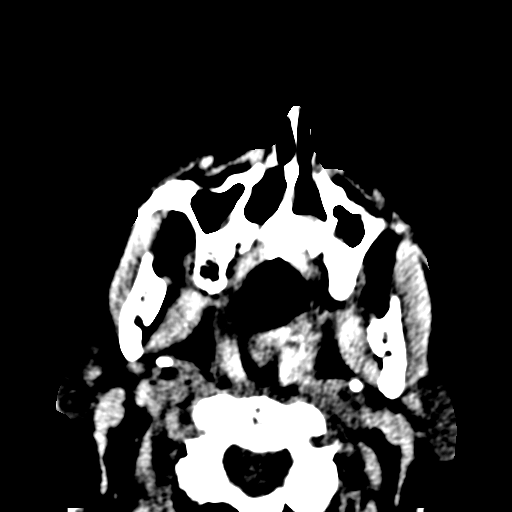
[im 46/76  brain]
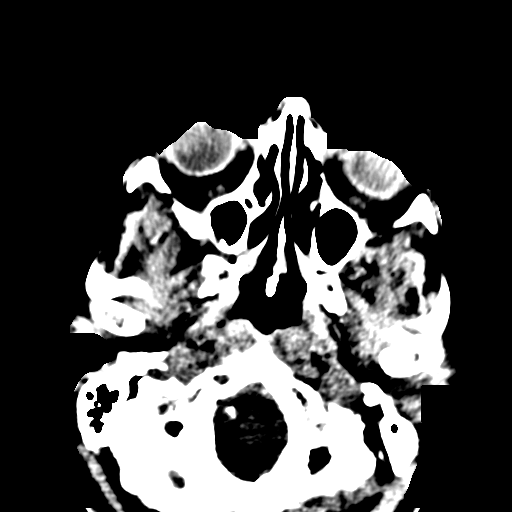
[im 61/76  brain]
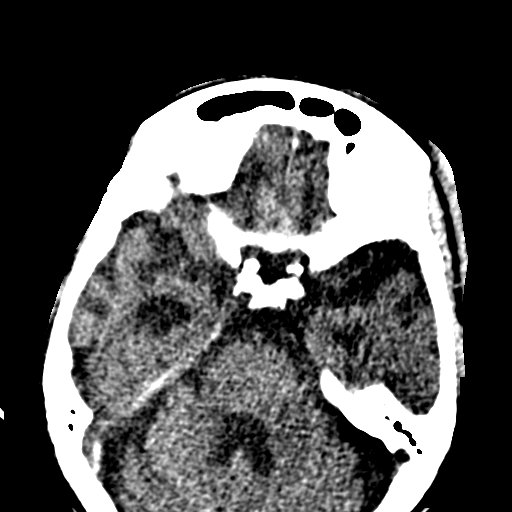

[Series 8: bone windows · axial · 0.43mm/px · z∈[-113,-35]mm · 3 of 52 slices shown]
[im 13/52  bone]
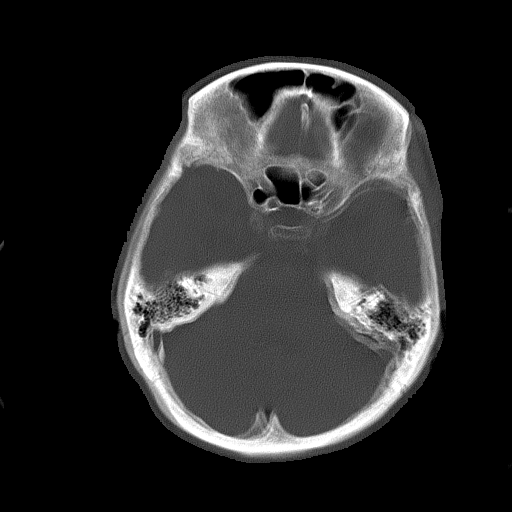
[im 26/52  bone]
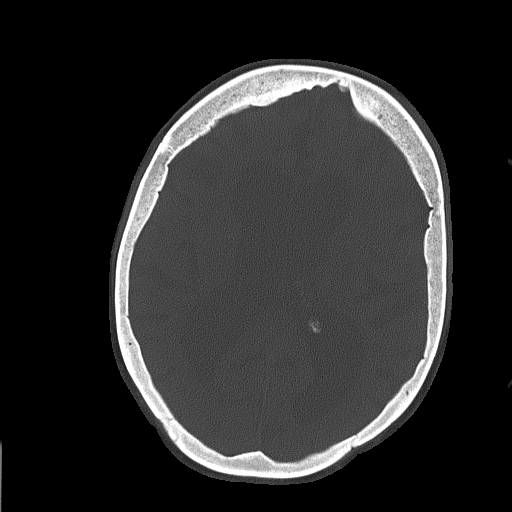
[im 39/52  bone]
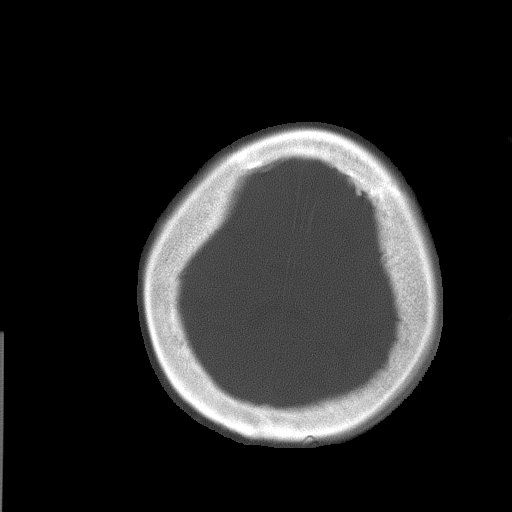

[Series 15: c-spine st · axial · 0.31mm/px · z∈[-242,-164]mm · 4 of 76 slices shown]
[im 13/76  brain]
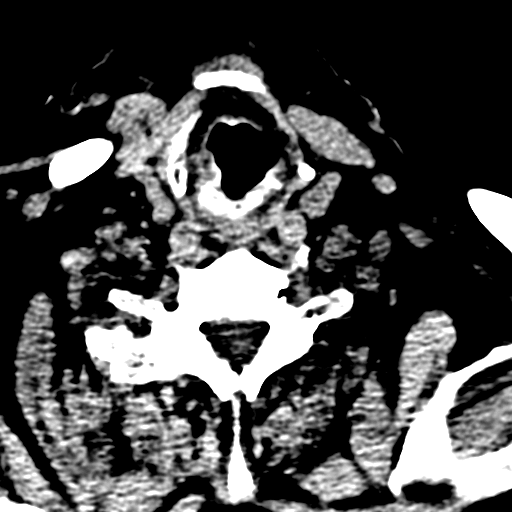
[im 26/76  brain]
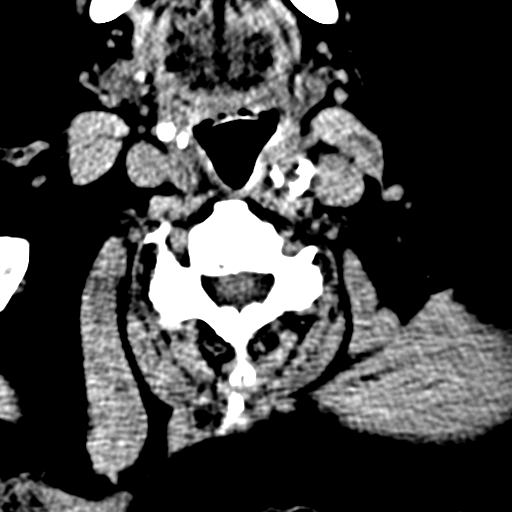
[im 38/76  brain]
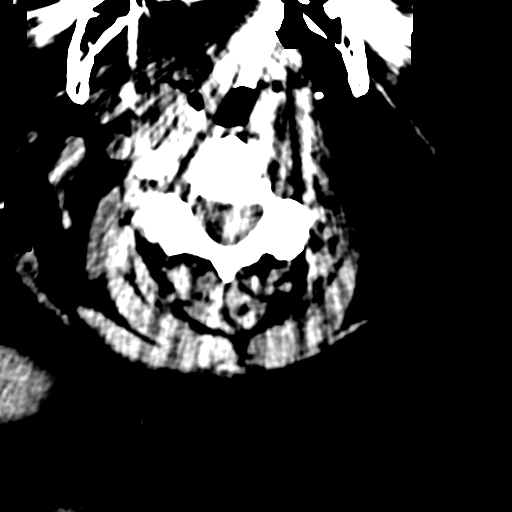
[im 51/76  brain]
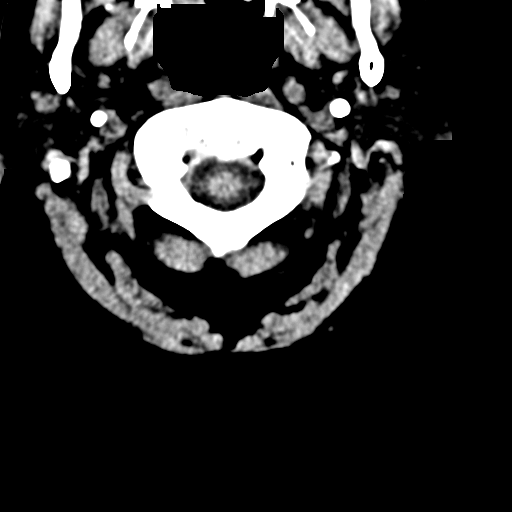

[Series 19: axial · axial · 0.18mm/px · z∈[-258,-187]mm · 4 of 72 slices shown]
[im 15/72  brain]
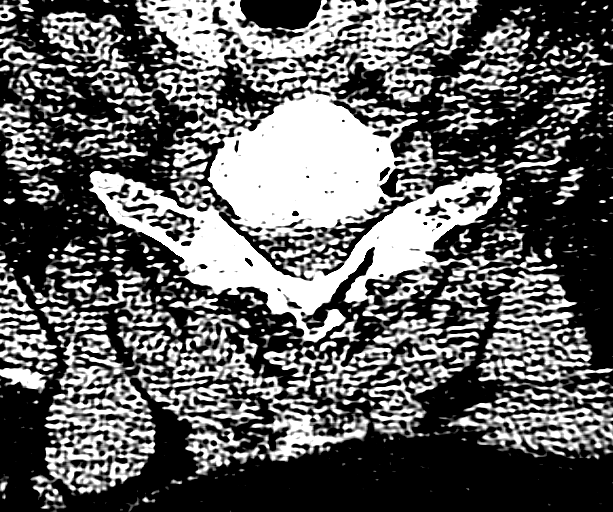
[im 29/72  brain]
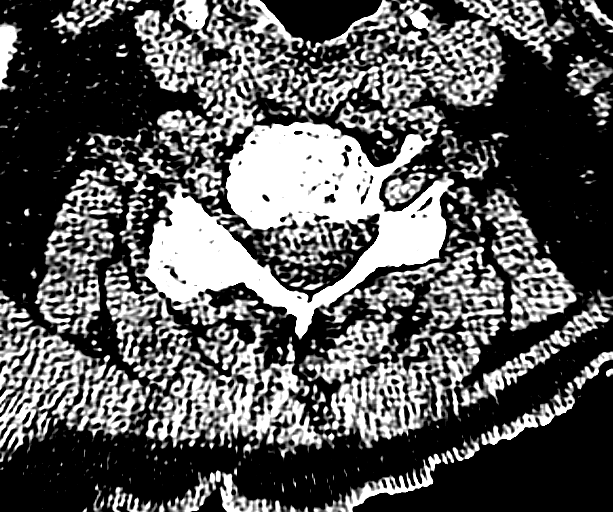
[im 43/72  brain]
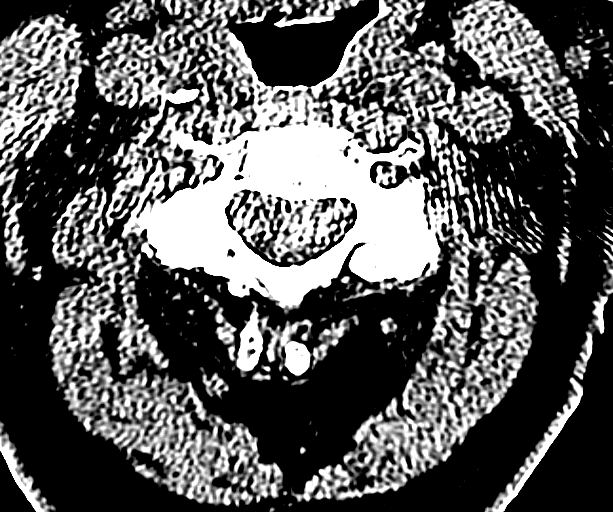
[im 57/72  brain]
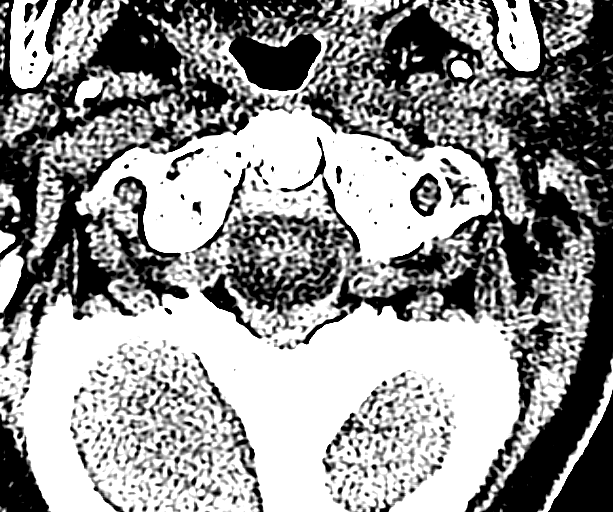

[15 of 30 positions shown; findings below may reference images not displayed]

FINDINGS: CT HEAD FINDINGS

There is no evidence of acute infarction, mass lesion, or intra- or
extra-axial hemorrhage on CT.

Prominence of the ventricles and sulci reflects moderately severe
cortical volume loss. Cerebellar atrophy is noted. Mild
periventricular white matter change likely reflects small vessel
ischemic microangiopathy.

The brainstem and fourth ventricle are within normal limits. The
basal ganglia are unremarkable in appearance. The cerebral
hemispheres demonstrate grossly normal gray-white differentiation.
No mass effect or midline shift is seen.

There is no evidence of fracture; visualized osseous structures are
unremarkable in appearance. The visualized portions of the orbits
are within normal limits. The paranasal sinuses and mastoid air
cells are well-aerated. Soft tissue swelling is noted overlying the
left frontoparietal calvarium.

CT MAXILLOFACIAL FINDINGS

There is no evidence of fracture or dislocation. The maxilla and
mandible appear intact. The nasal bone is unremarkable in
appearance. The visualized dentition demonstrates no acute
abnormality. Scattered calcification is noted about the left
mandibular condylar head, concerning for chronic left
temporomandibular joint disease.

The orbits are intact bilaterally. The visualized paranasal sinuses
and mastoid air cells are well-aerated.

Soft tissue swelling is noted overlying the left frontoparietal
calvarium. The parapharyngeal fat planes are preserved. The
nasopharynx, oropharynx and hypopharynx are unremarkable in
appearance. The visualized portions of the valleculae and piriform
sinuses are grossly unremarkable.

The parotid and submandibular glands are within normal limits. No
cervical lymphadenopathy is seen.

CT CERVICAL SPINE FINDINGS

There is no evidence of acute fracture or subluxation. Vertebral
bodies demonstrate normal height. There is minimal grade 1
anterolisthesis of C4 on C5, multilevel disc space narrowing and
scattered anterior and posterior disc osteophyte complexes along the
lower cervical spine. There is underlying chronic osseous fusion at
the right-sided facets at C4-C5. Prevertebral soft tissues are
within normal limits.

A 1.6 cm hypodensity is noted at the left thyroid lobe. The
minimally visualized lung apices are clear. Dense calcification is
seen at the carotid bifurcations bilaterally.
IMPRESSION: 1. No evidence of traumatic intracranial injury or fracture.
2. No evidence of fracture or dislocation with regard to the
maxillofacial structures.
3. No evidence of acute fracture or subluxation along the cervical
spine.
4. Soft tissue swelling overlying the left frontoparietal calvarium.
5. Moderately severe cortical volume loss and scattered small vessel
ischemic microangiopathy.
6. Mild degenerative change noted along the cervical spine.
7. Scattered calcification about the left mandibular condylar head,
concerning for chronic left temporomandibular joint disease.
8. 1.6 cm hypodensity at the left thyroid lobe. Consider further
evaluation with thyroid ultrasound. If patient is clinically
hyperthyroid, consider nuclear medicine thyroid uptake and scan.
9. Dense calcification at the carotid bifurcations bilaterally.
Carotid ultrasound would be helpful for further evaluation, when and
as deemed clinically appropriate.

## 2016-04-09 IMAGING — CR DG SHOULDER 2+V*R*
4 series · 4 of 4 positions shown · non-contrast
Comparison: None.

CLINICAL DATA: Status post fall; bilateral shoulder pain. Initial
encounter.

EXAM:
RIGHT SHOULDER - 2+ VIEW

[x shoulder ap right (1 of 4)]
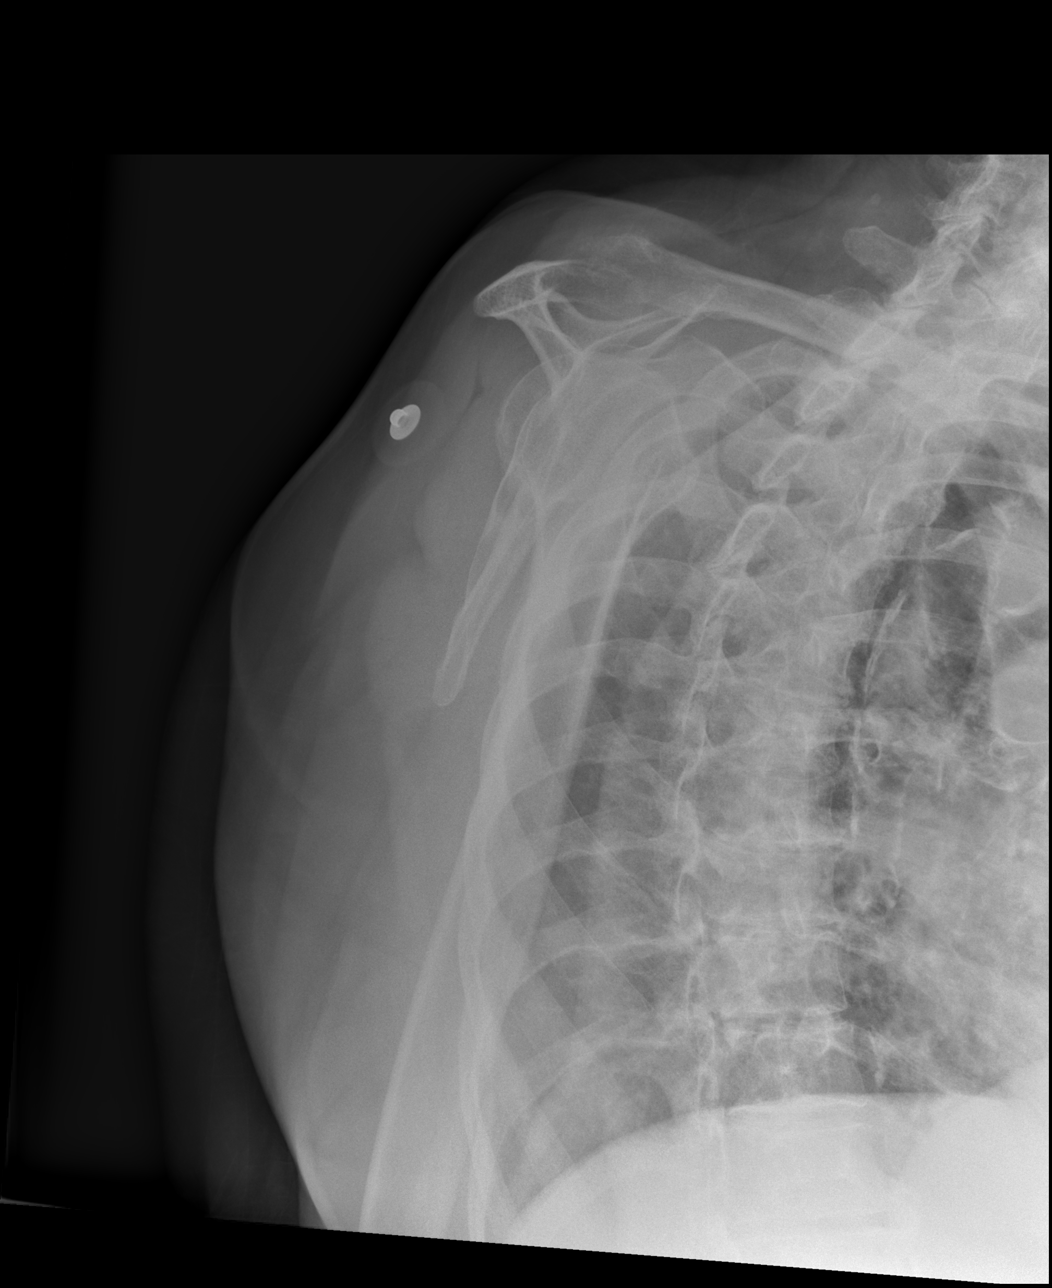

[x shoulder ap right (2 of 4)]
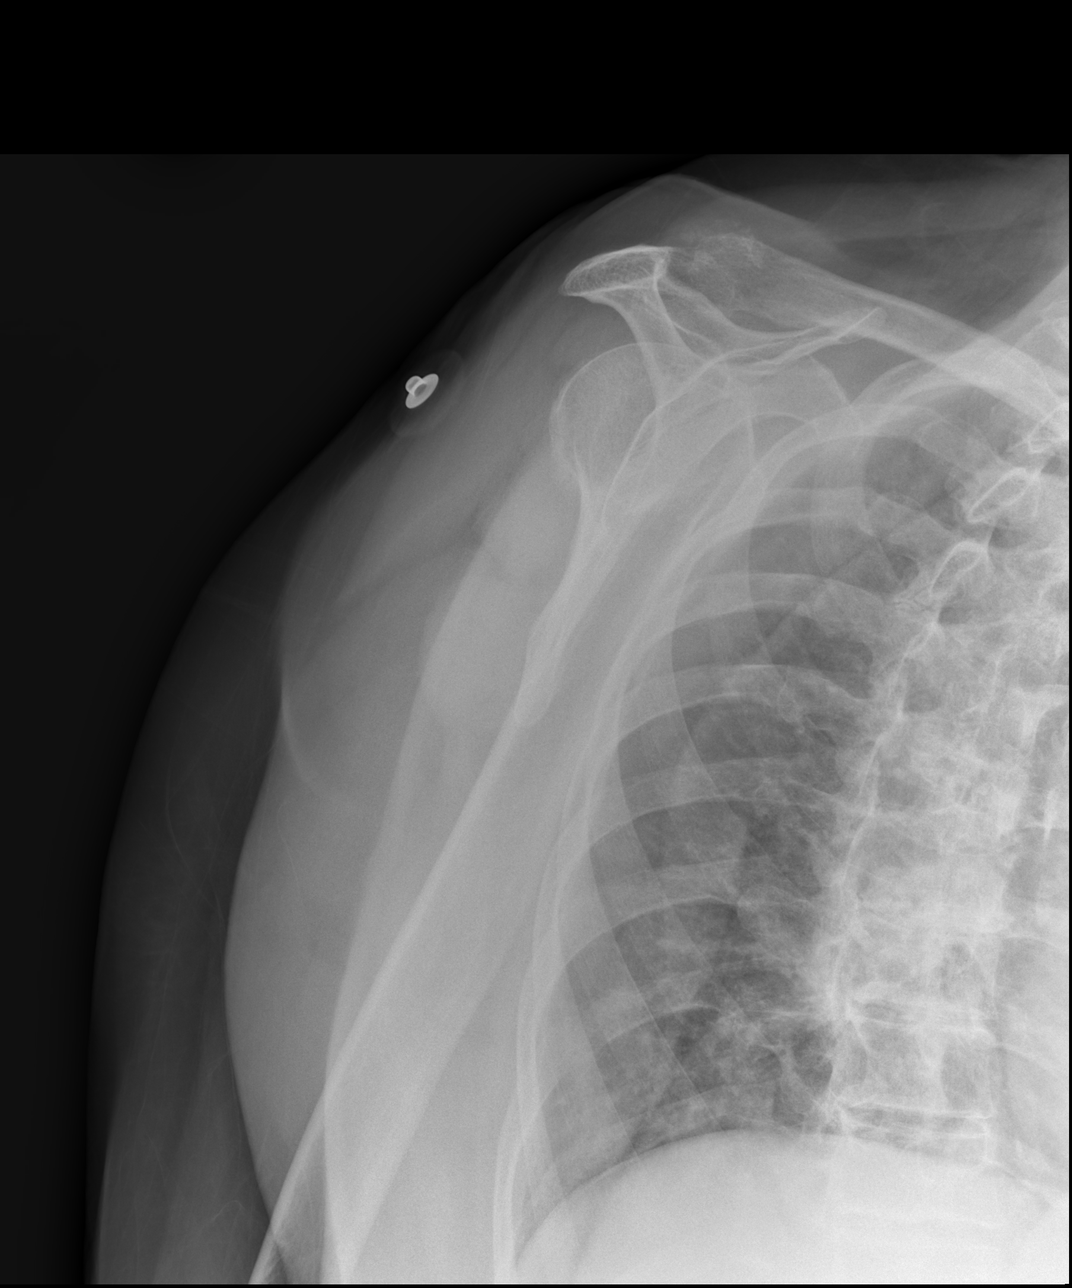

[x shoulder ap right (3 of 4)]
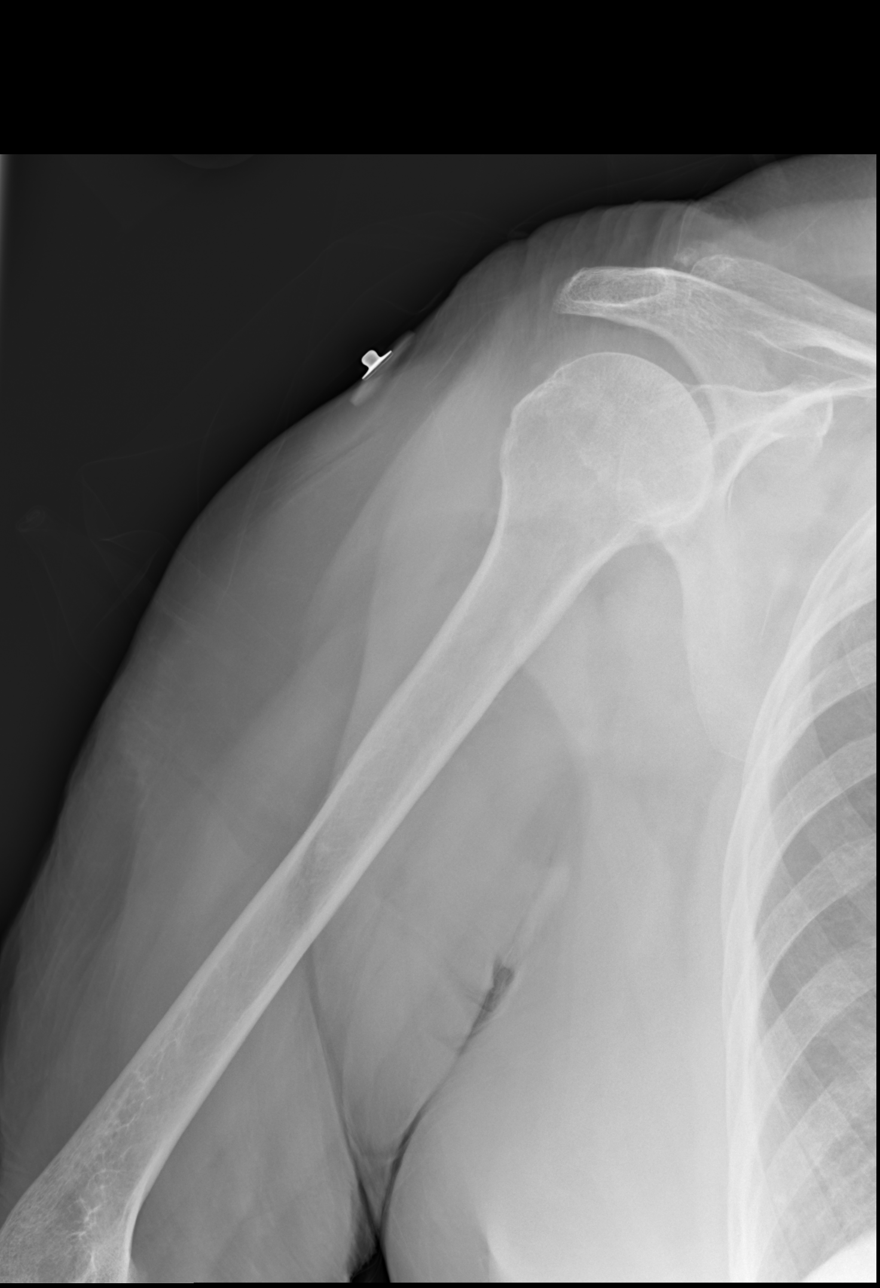

[x shoulder ap right (4 of 4)]
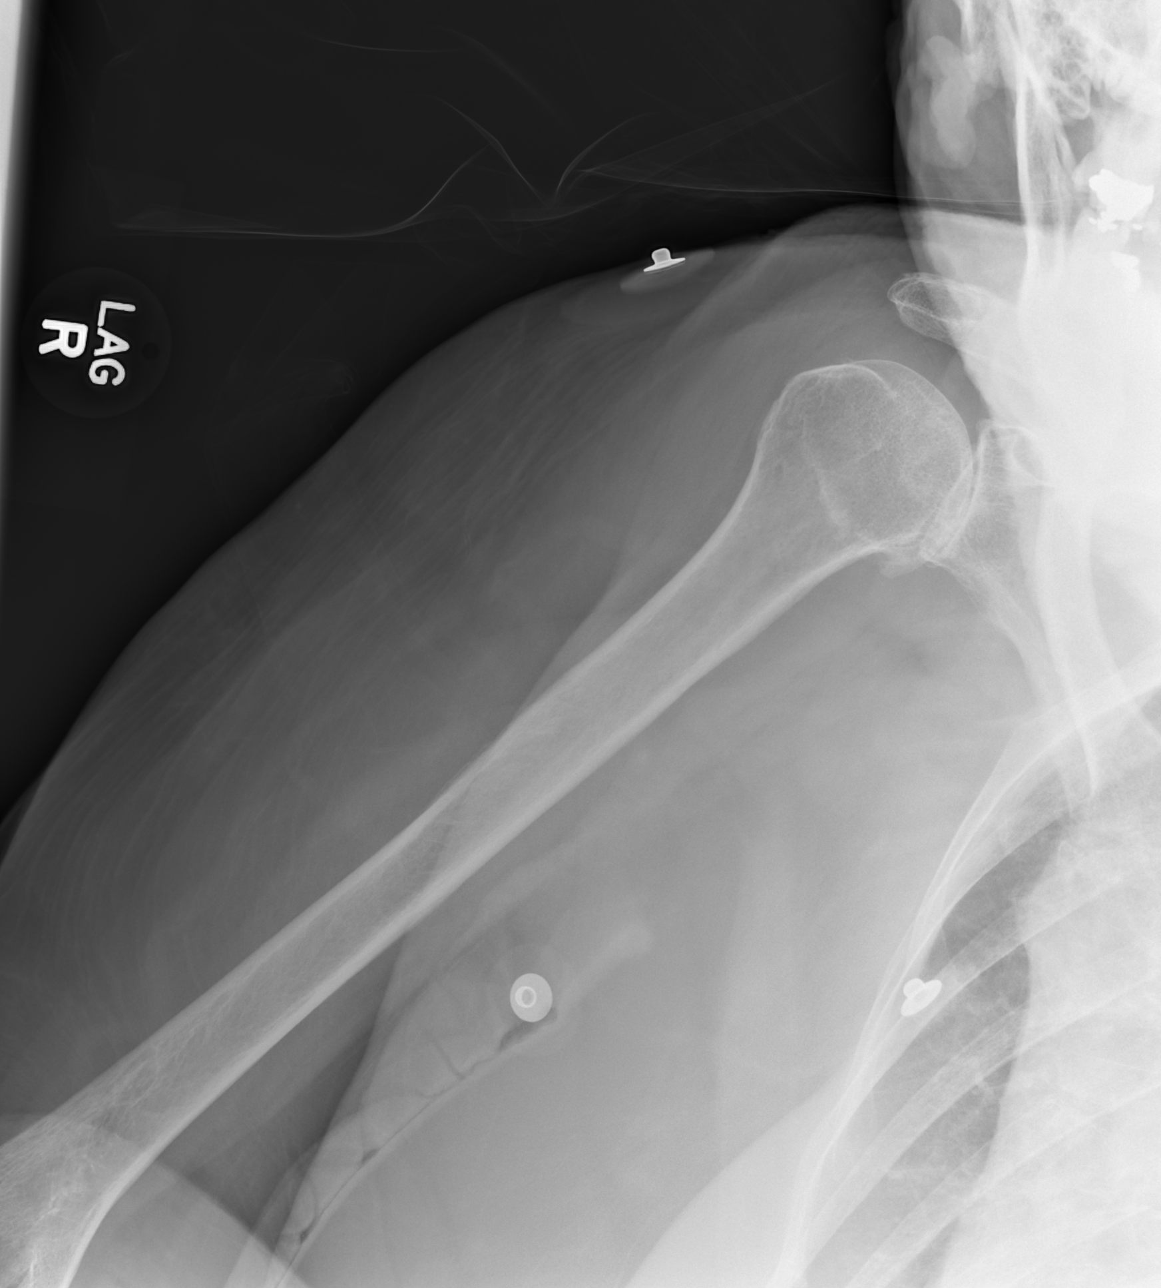

[4 of 4 positions shown; findings below may reference images not displayed]

FINDINGS: There is no evidence of fracture or dislocation. The right humeral
head is seated within the glenoid fossa. Mild osteophyte formation
is noted at the medial aspect of the humeral head, with mild
sclerotic change noted at the glenoid. Mild degenerative change is
noted at the right acromioclavicular joint. No significant soft
tissue abnormalities are seen. The visualized portions of the right
lung are clear.
IMPRESSION: 1. No evidence of fracture or dislocation.
2. Mild degenerative change at the right glenohumeral joint.
